# Patient Record
Sex: Male | Born: 1960 | Race: White | Hispanic: No | Marital: Single | Smoking: Current every day smoker
Health system: Southern US, Community
[De-identification: ages and names within clinical notes are randomized; demographics above are authoritative.]

## PROBLEM LIST (undated history)

## (undated) DIAGNOSIS — E119 Type 2 diabetes mellitus without complications: Secondary | ICD-10-CM

## (undated) DIAGNOSIS — F250 Schizoaffective disorder, bipolar type: Secondary | ICD-10-CM

## (undated) DIAGNOSIS — F259 Schizoaffective disorder, unspecified: Secondary | ICD-10-CM

## (undated) DIAGNOSIS — F1020 Alcohol dependence, uncomplicated: Secondary | ICD-10-CM

## (undated) DIAGNOSIS — Z5900 Homelessness unspecified: Secondary | ICD-10-CM

## (undated) DIAGNOSIS — F329 Major depressive disorder, single episode, unspecified: Secondary | ICD-10-CM

## (undated) DIAGNOSIS — I714 Abdominal aortic aneurysm, without rupture, unspecified: Secondary | ICD-10-CM

## (undated) DIAGNOSIS — F319 Bipolar disorder, unspecified: Secondary | ICD-10-CM

## (undated) DIAGNOSIS — F32A Depression, unspecified: Secondary | ICD-10-CM

## (undated) HISTORY — PX: DENTAL SURGERY: SHX609

## (undated) HISTORY — DX: Abdominal aortic aneurysm, without rupture, unspecified: I71.40

---

## 2003-11-18 ENCOUNTER — Emergency Department (HOSPITAL_COMMUNITY): Admission: EM | Admit: 2003-11-18 | Discharge: 2003-11-18 | Payer: Self-pay | Admitting: Emergency Medicine

## 2003-12-30 ENCOUNTER — Emergency Department (HOSPITAL_COMMUNITY): Admission: EM | Admit: 2003-12-30 | Discharge: 2003-12-31 | Payer: Self-pay | Admitting: Emergency Medicine

## 2005-12-19 ENCOUNTER — Emergency Department (HOSPITAL_COMMUNITY): Admission: EM | Admit: 2005-12-19 | Discharge: 2005-12-19 | Payer: Self-pay | Admitting: Emergency Medicine

## 2006-05-26 ENCOUNTER — Emergency Department (HOSPITAL_COMMUNITY): Admission: EM | Admit: 2006-05-26 | Discharge: 2006-05-26 | Payer: Self-pay | Admitting: Emergency Medicine

## 2008-03-23 ENCOUNTER — Ambulatory Visit: Payer: Self-pay | Admitting: Internal Medicine

## 2008-04-20 ENCOUNTER — Ambulatory Visit: Payer: Self-pay | Admitting: Internal Medicine

## 2008-04-23 ENCOUNTER — Emergency Department (HOSPITAL_COMMUNITY): Admission: EM | Admit: 2008-04-23 | Discharge: 2008-04-23 | Payer: Self-pay | Admitting: Emergency Medicine

## 2008-05-08 ENCOUNTER — Ambulatory Visit (HOSPITAL_COMMUNITY): Admission: RE | Admit: 2008-05-08 | Discharge: 2008-05-09 | Payer: Self-pay | Admitting: General Surgery

## 2008-07-14 ENCOUNTER — Inpatient Hospital Stay (HOSPITAL_COMMUNITY): Admission: RE | Admit: 2008-07-14 | Discharge: 2008-07-15 | Payer: Self-pay | Admitting: General Surgery

## 2010-05-14 ENCOUNTER — Emergency Department (HOSPITAL_COMMUNITY): Admission: EM | Admit: 2010-05-14 | Discharge: 2010-05-14 | Payer: Self-pay | Admitting: Emergency Medicine

## 2010-11-27 ENCOUNTER — Emergency Department (HOSPITAL_COMMUNITY)
Admission: EM | Admit: 2010-11-27 | Discharge: 2010-11-27 | Disposition: A | Discharge status: Home / Self Care | Payer: Self-pay | Attending: Emergency Medicine | Admitting: Emergency Medicine

## 2010-11-27 DIAGNOSIS — M7989 Other specified soft tissue disorders: Secondary | ICD-10-CM | POA: Insufficient documentation

## 2010-11-27 DIAGNOSIS — M109 Gout, unspecified: Secondary | ICD-10-CM | POA: Insufficient documentation

## 2010-11-27 DIAGNOSIS — M79609 Pain in unspecified limb: Secondary | ICD-10-CM | POA: Insufficient documentation

## 2010-12-05 LAB — DIFFERENTIAL
Eosinophils Relative: 0 % (ref 0–5)
Lymphocytes Relative: 17 % (ref 12–46)
Lymphs Abs: 1.9 10*3/uL (ref 0.7–4.0)
Monocytes Absolute: 0.9 10*3/uL (ref 0.1–1.0)
Neutro Abs: 8.4 10*3/uL — ABNORMAL HIGH (ref 1.7–7.7)

## 2010-12-05 LAB — CBC
HCT: 40.8 % (ref 39.0–52.0)
MCV: 96.9 fL (ref 78.0–100.0)
Platelets: 214 10*3/uL (ref 150–400)
RBC: 4.21 MIL/uL — ABNORMAL LOW (ref 4.22–5.81)
WBC: 11.2 10*3/uL — ABNORMAL HIGH (ref 4.0–10.5)

## 2010-12-05 LAB — BASIC METABOLIC PANEL
Chloride: 104 mEq/L (ref 96–112)
GFR calc Af Amer: >60 mL/min (ref >60)
GFR calc non Af Amer: >60 mL/min (ref >60)
Potassium: 3.6 mEq/L (ref 3.5–5.1)

## 2010-12-05 LAB — GLUCOSE, CAPILLARY: Glucose-Capillary: 84 mg/dL (ref 70–99)

## 2011-02-04 NOTE — Op Note (Signed)
[name], [name]          ACCOUNT NO.:  [id number]   MEDICAL RECORD NO.:  [id number]          PATIENT TYPE:  AMB   LOCATION:  DAY                          FACILITY:  [facility]   PHYSICIAN:  Juanetta Gosling, MDDATE OF BIRTH:  04-15-1961   DATE OF PROCEDURE:  05/08/2008  DATE OF DISCHARGE:                               OPERATIVE REPORT   PREOPERATIVE DIAGNOSIS:  Right inguinal hernia.   POSTOPERATIVE DIAGNOSIS:  Direct right inguinal hernia.   OPERATION PERFORMED:  Right inguinal hernia repair with large Prolene  hernia system mesh.   SURGEON:  Juanetta Gosling, MD   ASSISTANT:  Leonie Man, M.D.   ANESTHESIA:  General.   FINDINGS:  Direct hernia.   SPECIMENS:  None.   BLOOD LOSS:  Minimal.   COMPLICATIONS:  None.   DRAINS:  None.   DISPOSITION:  To PACU in stable condition.   INDICATIONS FOR PROCEDURE:  This is a 50 year old male with a history of  gout.  He also has a 2-year history of a right groin bulge __________  last year has been present, only reducible when he is lying down.  This  is described as painful at times, describes no other symptoms with  urination or bowel movement.  On exam he had no left inguinal hernia.  On the right, inguinal hernia present.  I counseled him for repair with  mesh.   DESCRIPTION OF PROCEDURE:  After informed consent was obtained, the  patient was taken to the operating room.  He was administered 1 g of  intravenous Ancef and placed under general endotracheal anesthesia  without complication.  His right groin was then prepped and draped in  standard sterile surgical fashion.  A 4 cm incision was then made  overlying the right groin crease and dissection carried out down to the  level of external oblique which was then entered sharply through the  external ring.  The cord was then encircled along the ilioinguinal nerve  which was preserved and placed in a Penrose drain. He had no evidence of  an indirect hernia or  cord lipoma.  Upon retraction of the cord, he was  noted to have a large direct hernia.  The space was freed laterally  under the external abdominal oblique.  Electrocautery was used to enter  the preperitoneal space and a sponge was then used to develop this  preperitoneal space over the pubic tubercle and over Cooper's ligament.  The direct hernia was reduced from the sac and a sponge was then  inserted.  The Prolene hernia system was then brought up and oriented  correctly.  The sponge was removed.  The Prolene hernia system was then  inserted.  The bottom bilayer was then spread over Cooper's ligament and  over the pubic tubercle.  I then used a 2-0 Vicryl to close the floor in  between these two layers of mesh. The connector was then brought up into  position and the top layer was then laid flat.  This was sutured in  position at but not into the pubic tubercle with a 2-0 Vicryl.  A T cut  was then made around the cord.  This was then wrapped around the cord  and then sutured together as well as well as tacking this to the  inguinal ligament inferiorly.  Superiorly this was tacked to the  internal oblique and the lateral limb was then laid flat underneath the  external abdominal oblique.  Hemostasis was observed, the Penrose was  removed.  The vas deferens and testicular arteries and vessels had been  identified and preserved throughout this portion of the operation as  well.  The external abdominal oblique was closed with a 2-0 Vicryl.  Scarpa's was approximated with a 3-0 Vicryl.  The skin was closed with 4-  0 Monocryl in subcuticular fashion.  10 mL of 1% lidocaine mixed with  0.25% Marcaine without epinephrine was then infiltrated into the wound  as well as performing an ilioinguinal block.  Dermabond was then placed  over the wound.  He tolerated this well, was extubated in the operating  room and transferred back in stable condition.      Juanetta Gosling, MD   Electronically Signed     MCW/MEDQ  D:  05/08/2008  T:  05/08/2008  Job:  [id number]

## 2011-02-04 NOTE — Op Note (Signed)
NAMELEELYND, MALDONADO          ACCOUNT NO.:  [id number]   MEDICAL RECORD NO.:  [id number]          PATIENT TYPE:  INP   LOCATION:  0009                         FACILITY:  [facility]   PHYSICIAN:  Juanetta Gosling, MDDATE OF BIRTH:  [birth year]   DATE OF PROCEDURE:  07/14/2008  DATE OF DISCHARGE:                               OPERATIVE REPORT   PREOPERATIVE DIAGNOSES:  Right groin hernia.   POSTOPERATIVE DIAGNOSES:  Right femoral hernia.   PROCEDURE:  Right femoral hernia repair with mesh, laparoscopic.   SURGEON:  Juanetta Gosling, M.D.   ASSISTANT:  Ardeth Sportsman, M.D.   ANESTHESIA:  General.   FINDINGS:  Femoral hernia.   SPECIMENS:  None.   DRAINS:  None.   COMPLICATIONS:  None.   ESTIMATED BLOOD LOSS:  Minimal.   DISPOSITION:  To PACU in stable condition.   INDICATIONS:  Jorja Loa is a 50 year old male, who presented to me on August 3  with a two-year history of right-groin pain.  I took him to the  operating room on August 17 for right inguinal hernia repair.  He had a  large direct right inguinal hernia that I repaired with a large Prolene  hernia system.  He did well until two weeks postoperatively and  developed an acute bulge in his right groin, associated with some pain.  On exam, he appeared to have a recurrent hernia.  I took him to the  operating room today for laparoscopic repair.   DESCRIPTION OF PROCEDURE:  After informed consent was obtained, patient  was taken to the operating room, he was administered one gram of  intravenous Ancef.  Sequential compression devices were placed on the  lower extremities throughout the procedure.  He underwent general  endotracheal anesthesia without complication.  Foley catheter was  placed.  His arms were tucked and appropriately padded.  His abdomen and  groin were then prepped and draped in standard sterile surgical fashion.  Surgical time-out was then performed.   A 10 mm curvilinear infraumbilical incision was  then made and dissection  carried out down to the level of the external abdominal oblique.  This  was incised sharply.  The rectus muscle was pulled laterally and a  Hasson trocar was then inserted into the preperitoneal space.  The  preperitoneal space was then insufflated without complication.  The  camera was used to dissect this space easily.  Two further 5 mm trocars  were placed at the level of the umbilicus, just lateral to the rectus on  either side, under direct vision, after infiltration with 0.25% Marcaine  without epinephrine.  Following this, the right side was then  approached.  The right groin was then dissected.  The iliac vessels,  epigastric vessels, bladder, pubic symphysis and Cooper's ligament were  easily identified.   It appeared that he had a femoral hernia that had gone below the mesh  that I had placed previously.  He did not have any evidence of indirect  or direct inguinal hernia.  These were covered by the mesh.  The femoral  hernia was then reduced in total.  The peritoneal sac  was then brought  back cephalad to the level of the ASIS.  There was a rent made in the  peritoneal sac during this portion that was fixed with clips.  Following  reduction of his femoral hernia, a 12 by 15 cm piece of Ultrapro mesh  was then inserted and placed into position.  This was tacked twice to  Cooper's ligament and three times to the abdominal wall, laterally.   Following this, the preperitoneal space was desufflated with a grasper  on the mesh to let it lie in good position, which it did.  He tolerated  this well.  All trocars were removed.  The incision in the external  abdominal oblique was closed with two simple Vicryl sutures.  Skin was  closed with a 4-0 Monocryl in subcuticular fashion.  Dermabond was  placed over all of the wounds.  Foley catheter was removed.  He was  extubated in the operating room and he was transferred to the PACU in  stable condition.       Juanetta Gosling, MD  Electronically Signed     MCW/MEDQ  D:  07/14/2008  T:  07/14/2008  Job:  [id number]

## 2011-06-20 LAB — DIFFERENTIAL
Basophils Absolute: 0
Basophils Relative: 1
Eosinophils Absolute: 0.2
Neutro Abs: 2.9
Neutrophils Relative %: 50

## 2011-06-20 LAB — COMPREHENSIVE METABOLIC PANEL
ALT: 9
Alkaline Phosphatase: 55
BUN: 9
CO2: 29
GFR calc non Af Amer: >60
Glucose, Bld: 90
Potassium: 4.1
Sodium: 143
Total Bilirubin: 0.7
Total Protein: 6.3

## 2011-06-20 LAB — CBC
HCT: 38.5 — ABNORMAL LOW
Hemoglobin: 12.9 — ABNORMAL LOW
RBC: 3.96 — ABNORMAL LOW
RDW: 13.9

## 2016-03-17 ENCOUNTER — Emergency Department (HOSPITAL_COMMUNITY): Payer: Medicaid Other

## 2016-03-17 ENCOUNTER — Encounter (HOSPITAL_COMMUNITY): Payer: Self-pay | Admitting: Emergency Medicine

## 2016-03-17 ENCOUNTER — Emergency Department (HOSPITAL_COMMUNITY)
Admission: EM | Admit: 2016-03-17 | Discharge: 2016-03-17 | Disposition: A | Discharge status: Home / Self Care | Payer: Medicaid Other | Attending: Emergency Medicine | Admitting: Emergency Medicine

## 2016-03-17 DIAGNOSIS — R51 Headache: Secondary | ICD-10-CM | POA: Insufficient documentation

## 2016-03-17 DIAGNOSIS — Z9889 Other specified postprocedural states: Secondary | ICD-10-CM

## 2016-03-17 DIAGNOSIS — F17200 Nicotine dependence, unspecified, uncomplicated: Secondary | ICD-10-CM | POA: Diagnosis not present

## 2016-03-17 DIAGNOSIS — R519 Headache, unspecified: Secondary | ICD-10-CM

## 2016-03-17 HISTORY — DX: Major depressive disorder, single episode, unspecified: F32.9

## 2016-03-17 HISTORY — DX: Depression, unspecified: F32.A

## 2016-03-17 LAB — COMPREHENSIVE METABOLIC PANEL
ALBUMIN: 4.4 g/dL (ref 3.5–5.0)
ALT: 9 U/L — ABNORMAL LOW (ref 17–63)
ANION GAP: 14 (ref 5–15)
AST: 27 U/L (ref 15–41)
Alkaline Phosphatase: 105 U/L (ref 38–126)
BILIRUBIN TOTAL: 1.2 mg/dL (ref 0.3–1.2)
BUN: 10 mg/dL (ref 6–20)
CHLORIDE: 99 mmol/L — AB (ref 101–111)
CO2: 24 mmol/L (ref 22–32)
Calcium: 9.5 mg/dL (ref 8.9–10.3)
Creatinine, Ser: 1.2 mg/dL (ref 0.61–1.24)
GFR calc Af Amer: >60 mL/min (ref >60)
GLUCOSE: 115 mg/dL — AB (ref 65–99)
POTASSIUM: 4.1 mmol/L (ref 3.5–5.1)
Sodium: 137 mmol/L (ref 135–145)
TOTAL PROTEIN: 6.7 g/dL (ref 6.5–8.1)

## 2016-03-17 LAB — CBC WITH DIFFERENTIAL/PLATELET
BASOS ABS: 0.1 10*3/uL (ref 0.0–0.1)
Basophils Relative: 0 %
Eosinophils Absolute: 0 10*3/uL (ref 0.0–0.7)
Eosinophils Relative: 0 %
HEMATOCRIT: 52.4 % — AB (ref 39.0–52.0)
Hemoglobin: 17.6 g/dL — ABNORMAL HIGH (ref 13.0–17.0)
LYMPHS ABS: 1.4 10*3/uL (ref 0.7–4.0)
LYMPHS PCT: 10 %
MCH: 33.4 pg (ref 26.0–34.0)
MCHC: 33.6 g/dL (ref 30.0–36.0)
MCV: 99.4 fL (ref 78.0–100.0)
Monocytes Absolute: 1.1 10*3/uL — ABNORMAL HIGH (ref 0.1–1.0)
Monocytes Relative: 7 %
NEUTROS ABS: 12.2 10*3/uL — AB (ref 1.7–7.7)
Neutrophils Relative %: 83 %
Platelets: 244 10*3/uL (ref 150–400)
RBC: 5.27 MIL/uL (ref 4.22–5.81)
RDW: 15 % (ref 11.5–15.5)
WBC: 14.8 10*3/uL — AB (ref 4.0–10.5)

## 2016-03-17 MED ORDER — KETOROLAC TROMETHAMINE 30 MG/ML IJ SOLN
30.0000 mg | Freq: Once | INTRAMUSCULAR | Status: AC
  Administered 2016-03-17: 30 mg via INTRAVENOUS
  Filled 2016-03-17: qty 1

## 2016-03-17 MED ORDER — SODIUM CHLORIDE 0.9 % IV BOLUS (SEPSIS)
1000.0000 mL | Freq: Once | INTRAVENOUS | Status: AC
  Administered 2016-03-17: 1000 mL via INTRAVENOUS

## 2016-03-17 MED ORDER — MORPHINE SULFATE (PF) 4 MG/ML IV SOLN
4.0000 mg | Freq: Once | INTRAVENOUS | Status: AC
  Administered 2016-03-17: 4 mg via INTRAVENOUS
  Filled 2016-03-17: qty 1

## 2016-03-17 MED ORDER — METOCLOPRAMIDE HCL 5 MG/ML IJ SOLN
10.0000 mg | Freq: Once | INTRAMUSCULAR | Status: AC
  Administered 2016-03-17: 10 mg via INTRAVENOUS
  Filled 2016-03-17: qty 2

## 2016-03-17 NOTE — ED Notes (Signed)
H/a started last night  Feels weak  has had diarrhea, states lives in tent

## 2016-03-17 NOTE — Discharge Instructions (Signed)
There does not appear to be an emergent cause for your symptoms at this time. Your exam, labs, CT scans and MRI of your brain are all reassuring. Please follow-up with your doctor or the [facility] to establish primary care. Return to ED for any new or worsening symptoms.  General Headache Without Cause A headache is pain or discomfort felt around the head or neck area. The specific cause of a headache may not be found. There are many causes and types of headaches. A few common ones are:  Tension headaches.  Migraine headaches.  Cluster headaches.  Chronic daily headaches. HOME CARE INSTRUCTIONS  Watch your condition for any changes. Take these steps to help with your condition: Managing Pain  Take over-the-counter and prescription medicines only as told by your health care provider.  Lie down in a dark, quiet room when you have a headache.  If directed, apply ice to the head and neck area:  Put ice in a plastic bag.  Place a towel between your skin and the bag.  Leave the ice on for 20 minutes, 2-3 times per day.  Use a heating pad or hot shower to apply heat to the head and neck area as told by your health care provider.  Keep lights dim if bright lights bother you or make your headaches worse. Eating and Drinking  Eat meals on a regular schedule.  Limit alcohol use.  Decrease the amount of caffeine you drink, or stop drinking caffeine. General Instructions  Keep all follow-up visits as told by your health care provider. This is important.  Keep a headache journal to help find out what may trigger your headaches. For example, write down:  What you eat and drink.  How much sleep you get.  Any change to your diet or medicines.  Try massage or other relaxation techniques.  Limit stress.  Sit up straight, and do not tense your muscles.  Do not use tobacco products, including cigarettes, chewing tobacco, or e-cigarettes. If you need help  quitting, ask your health care provider.  Exercise regularly as told by your health care provider.  Sleep on a regular schedule. Get 7-9 hours of sleep, or the amount recommended by your health care provider. SEEK MEDICAL CARE IF:   Your symptoms are not helped by medicine.  You have a headache that is different from the usual headache.  You have nausea or you vomit.  You have a fever. SEEK IMMEDIATE MEDICAL CARE IF:   Your headache becomes severe.  You have repeated vomiting.  You have a stiff neck.  You have a loss of vision.  You have problems with speech.  You have pain in the eye or ear.  You have muscular weakness or loss of muscle control.  You lose your balance or have trouble walking.  You feel faint or pass out.  You have confusion.   This information is not intended to replace advice given to you by your health care provider. Make sure you discuss any questions you have with your health care provider.   Document Released: 09/08/2005 Document Revised: 05/30/2015 Document Reviewed: 01/01/2015 Elsevier Interactive Patient Education [employer]2016 [employer] Inc.

## 2016-03-17 NOTE — ED Notes (Signed)
Dr. Shela CommonsJ made aware of patient's symptoms and symptom onset. No orders to call code stroke placed.

## 2016-03-17 NOTE — ED Provider Notes (Signed)
Complained of frontal headache onset upon awakening 4 AM today accompanied by blurred vision both eyes and numbness in the palm of his right hand. Numbness has since resolved. No fever. No nausea or vomiting. No treatment prior to coming here. On exam no distress Glasgow Coma Score 15 HEENT exam no facial asymmetry eyes pupils equal round react to light extraocular muscles intact neurologic Glasgow Coma Score 15 cranial nerves II through XII grossly intact motor strength 5 over 5 overall DTRs symmetric bilaterally at knee jerk ankle jerk and biceps was ordered bilaterally  Doug SouSam Mysti Haley, MD 03/17/16 1312

## 2016-03-17 NOTE — ED Provider Notes (Signed)
CSN: [id number]     Arrival date & time 03/17/16  1026 History   First MD Initiated Contact with Patient 03/17/16 1057     Chief Complaint  Patient presents with  . Headache     (Consider location/radiation/quality/duration/timing/severity/associated sxs/prior Treatment) HPI [name] is a 55 y.o. male who comes in for evaluation for headache. Patient reports at this morning at 4 AM he began having gradual onset, gradually worsening frontal headache. Not relieved with ibuprofen and Tylenol. Patient states he does not typically get headaches. Reports the palm of his hand and his tongue were intermittently numb this morning while waiting on the bus at approximately 9:00 AM. He reports associated diarrhea over the past one week that is gradually improving. Denies any fevers, chills, neck pain or stiffness, vision changes, rash, chest pain or shortness of breath. Nothing makes the problem better or worse. No other modifying factors.  40-pack-year smoking history. Past Medical History  Diagnosis Date  . Depression    History reviewed. No pertinent past surgical history. No family history on file. Social History  Substance Use Topics  . Smoking status: Current Every Day Smoker  . Smokeless tobacco: None  . Alcohol Use: None    Review of Systems A 10 point review of systems was completed and was negative except for pertinent positives and negatives as mentioned in the history of present illness     Allergies  Review of patient's allergies indicates no known allergies.  Home Medications   Prior to Admission medications   Not on File   BP 145/86 mmHg  Pulse 65  Temp(Src) 98.2 F (36.8 C) (Oral)  Resp 15  SpO2 98% Physical Exam  Constitutional: He is oriented to person, place, and time. He appears well-developed and well-nourished. No distress.  HENT:  Head: Normocephalic and atraumatic.  Mouth/Throat: Oropharynx is clear and moist.  Eyes: Conjunctivae and EOM are  normal. Pupils are equal, round, and reactive to light. Right eye exhibits no discharge. Left eye exhibits no discharge. No scleral icterus.  Neck: Normal range of motion. Neck supple.  No meningismus or nuchal rigidity  Cardiovascular: Normal rate, regular rhythm and normal heart sounds.   Pulmonary/Chest: Effort normal and breath sounds normal. No respiratory distress. He has no wheezes. He has no rales.  Abdominal: Soft. There is no tenderness.  Musculoskeletal: Normal range of motion. He exhibits no edema or tenderness.  Neurological: He is alert and oriented to person, place, and time.  Cranial Nerves II-XII grossly intact. Motor strength 5/5 in all 4 extremities. Sensation intact to light touch. Grip strength equal bilaterally. Finger to nose normal. Moves all extremities without ataxia. Gait baseline.  Skin: Skin is warm and dry. No rash noted. He is not diaphoretic.  Psychiatric: He has a normal mood and affect.  Nursing note and vitals reviewed.   ED Course  Procedures (including critical care time) Labs Review Labs Reviewed  COMPREHENSIVE METABOLIC PANEL - Abnormal; Notable for the following:    Chloride 99 (*)    Glucose, Bld 115 (*)    ALT 9 (*)    All other components within normal limits  CBC WITH DIFFERENTIAL/PLATELET - Abnormal; Notable for the following:    WBC 14.8 (*)    Hemoglobin 17.6 (*)    HCT 52.4 (*)    Neutro Abs 12.2 (*)    Monocytes Absolute 1.1 (*)    All other components within normal limits    Imaging Review Dg Eye Foreign Body  03/17/2016  CLINICAL DATA:  Metal working/exposure; clearance prior to MRI EXAM: ORBITS FOR FOREIGN BODY - 2 VIEW COMPARISON:  None. FINDINGS: No evidence of radiopaque structure in the region of the orbits. Rounded metallic structure left aspect of the upper neck. Visualized paranasal sinuses are clear. IMPRESSION: No evidence of radiopaque structure in the region of the orbits. Rounded metallic structure left aspect of the  upper neck. Electronically Signed   By: Lacy Duverney M.D.   On: 03/17/2016 15:21   Dg Chest 2 View  03/17/2016  CLINICAL DATA:  Wheezing, severe headache since 4 a.m.  Dizziness. EXAM: CHEST  2 VIEW COMPARISON:  05/14/2010 FINDINGS: Heart and mediastinal contours are within normal limits. No focal opacities or effusions. No acute bony abnormality. IMPRESSION: No active cardiopulmonary disease. Electronically Signed   By: Charlett Nose M.D.   On: 03/17/2016 11:57   Ct Head Wo Contrast  03/17/2016  CLINICAL DATA:  Headaches EXAM: CT HEAD WITHOUT CONTRAST TECHNIQUE: Contiguous axial images were obtained from the base of the skull through the vertex without intravenous contrast. COMPARISON:  None. FINDINGS: The bony calvarium is intact. The ventricles are of normal size and configuration. No findings to suggest acute hemorrhage, acute infarction or space-occupying mass lesion are noted. IMPRESSION: No acute intracranial abnormality noted. Electronically Signed   By: Alcide Clever M.D.   On: 03/17/2016 12:10   Mr Brain Wo Contrast (neuro Protocol)  03/17/2016  CLINICAL DATA:  New onset of frontal headache which awoke him from sleep at 4 a.m. today. Associated blurred vision in both eyes. Numbness in the palm all those right hand. Numbness has since resolved. EXAM: MRI HEAD WITHOUT CONTRAST TECHNIQUE: Multiplanar, multiecho pulse sequences of the brain and surrounding structures were obtained without intravenous contrast. COMPARISON:  CT head from the same day. FINDINGS: There is significant artifact in susceptibility centered at the level of the left temporal bone. This obscures significant portions of the exam. Moderate generalized atrophy is advanced for age. Focal encephalomalacia is noted in the right occipital pole suggesting a remote ischemic event. No acute infarct, hemorrhage, or mass lesion is present. The ventricles are proportionate to the degree of atrophy. No significant extra-axial fluid collection  is present. The right internal auditory canal is visualized and normal. Flow is present in the major intracranial arteries. The globes and orbits are intact. The visualized paranasal sinuses and right mastoid air cells are clear. Skullbase is within normal limits. Midline sagittal images are unremarkable. IMPRESSION: 1. No acute intracranial abnormality. 2. 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 Electronically Signed   By: Marin Roberts M.D.   On: 03/17/2016 16:42   I have personally reviewed and evaluated these images and lab results as part of my medical decision-making.   EKG Interpretation None      MDM  Pt With frontal HA treated and improved while in ED.  Presentation not concerning for [facility], ICH, Meningitis, or temporal arteritis. Pt is afebrile with no focal neuro deficits, nuchal rigidity. He does complain of fleeting and bilateral vision blurriness, paresthesias in right palm and tongue. CT head and MRI are both negative for any acute intracranial abnormalities. Heart rate improved dramatically with IV fluids, likely secondary to patient's dehydration. Nonspecific leukocytosis, no evidence of infection. Headache possibly secondary to dehydration. Pt is to follow up with PCP to discuss prophylactic medication. Pt verbalizes understanding and is agreeable with plan to dc.  Prior to patient discharge, I discussed and reviewed this case with  Dr. Ethelda ChickJacubowitz  Final diagnoses:  Nonintractable headache, unspecified chronicity pattern, unspecified headache type        Joycie PeekBenjamin Kiran Lapine, PA-C 03/17/16 2002  Doug SouSam Jacubowitz, MD 03/18/16 1623

## 2016-04-01 ENCOUNTER — Ambulatory Visit: Payer: Medicaid Other | Attending: Internal Medicine | Admitting: Internal Medicine

## 2016-04-01 ENCOUNTER — Encounter: Payer: Self-pay | Admitting: Internal Medicine

## 2016-04-01 VITALS — BP 163/98 | HR 108 | Temp 98.3°F | Resp 16 | Wt 147.4 lb

## 2016-04-01 DIAGNOSIS — Z5900 Homelessness unspecified: Secondary | ICD-10-CM | POA: Insufficient documentation

## 2016-04-01 DIAGNOSIS — Z720 Tobacco use: Secondary | ICD-10-CM | POA: Diagnosis not present

## 2016-04-01 DIAGNOSIS — Z125 Encounter for screening for malignant neoplasm of prostate: Secondary | ICD-10-CM

## 2016-04-01 DIAGNOSIS — Z131 Encounter for screening for diabetes mellitus: Secondary | ICD-10-CM

## 2016-04-01 DIAGNOSIS — I10 Essential (primary) hypertension: Secondary | ICD-10-CM | POA: Diagnosis not present

## 2016-04-01 DIAGNOSIS — Z23 Encounter for immunization: Secondary | ICD-10-CM

## 2016-04-01 DIAGNOSIS — J302 Other seasonal allergic rhinitis: Secondary | ICD-10-CM

## 2016-04-01 MED ORDER — LORATADINE 10 MG PO TABS: 10.0000 mg | ORAL_TABLET | Freq: Every day | ORAL | Status: DC

## 2016-04-01 MED ORDER — METOPROLOL TARTRATE 25 MG PO TABS: 25.0000 mg | ORAL_TABLET | Freq: Two times a day (BID) | ORAL | Status: DC

## 2016-04-01 MED ORDER — PNEUMOCOCCAL VAC POLYVALENT 25 MCG/0.5ML IJ INJ: 0.5000 mL | INJECTION | Freq: Once | INTRAMUSCULAR | Status: DC

## 2016-04-01 NOTE — Patient Instructions (Signed)
Smoking Cessation, Tips for Success If you are ready to quit smoking, congratulations! You have chosen to help yourself be healthier. Cigarettes bring nicotine, tar, carbon monoxide, and other irritants into your body. Your lungs, heart, and blood vessels will be able to work better without these poisons. There are many different ways to quit smoking. Nicotine gum, nicotine patches, a nicotine inhaler, or nicotine nasal spray can help with physical craving. Hypnosis, support groups, and medicines help break the habit of smoking. WHAT THINGS CAN I DO TO MAKE QUITTING EASIER?  Here are some tips to help you quit for good:  Pick a date when you will quit smoking completely. Tell all of your friends and family about your plan to quit on that date.  Do not try to slowly cut down on the number of cigarettes you are smoking. Pick a quit date and quit smoking completely starting on that day.  Throw away all cigarettes.   Clean and remove all ashtrays from your home, work, and car.  On a card, write down your reasons for quitting. Carry the card with you and read it when you get the urge to smoke.  Cleanse your body of nicotine. Drink enough water and fluids to keep your urine clear or pale yellow. Do this after quitting to flush the nicotine from your body.  Learn to predict your moods. Do not let a bad situation be your excuse to have a cigarette. Some situations in your life might tempt you into wanting a cigarette.  Never have "just one" cigarette. It leads to wanting another and another. Remind yourself of your decision to quit.  Change habits associated with smoking. If you smoked while driving or when feeling stressed, try other activities to replace smoking. Stand up when drinking your coffee. Brush your teeth after eating. Sit in a different chair when you read the paper. Avoid alcohol while trying to quit, and try to drink fewer caffeinated beverages. Alcohol and caffeine may urge you to  smoke.  Avoid foods and drinks that can trigger a desire to smoke, such as sugary or spicy foods and alcohol.  Ask people who smoke not to smoke around you.  Have something planned to do right after eating or having a cup of coffee. For example, plan to take a walk or exercise.  Try a relaxation exercise to calm you down and decrease your stress. Remember, you may be tense and nervous for the first 2 weeks after you quit, but this will pass.  Find new activities to keep your hands busy. Play with a pen, coin, or rubber band. Doodle or draw things on paper.  Brush your teeth right after eating. This will help cut down on the craving for the taste of tobacco after meals. You can also try mouthwash.   Use oral substitutes in place of cigarettes. Try using lemon drops, carrots, cinnamon sticks, or chewing gum. Keep them handy so they are available when you have the urge to smoke.  When you have the urge to smoke, try deep breathing.  Designate your home as a nonsmoking area.  If you are a heavy smoker, ask your health care provider about a prescription for nicotine chewing gum. It can ease your withdrawal from nicotine.  Reward yourself. Set aside the cigarette money you save and buy yourself something nice.  Look for support from others. Join a support group or smoking cessation program. Ask someone at home or at work to help you with your plan   to quit smoking.  Always ask yourself, "Do I need this cigarette or is this just a reflex?" Tell yourself, "Today, I choose not to smoke," or "I do not want to smoke." You are reminding yourself of your decision to quit.  Do not replace cigarette smoking with electronic cigarettes (commonly called e-cigarettes). The safety of e-cigarettes is unknown, and some may contain harmful chemicals.  If you relapse, do not give up! Plan ahead and think about what you will do the next time you get the urge to smoke. HOW WILL I FEEL WHEN I QUIT SMOKING? You  may have symptoms of withdrawal because your body is used to nicotine (the addictive substance in cigarettes). You may crave cigarettes, be irritable, feel very hungry, cough often, get headaches, or have difficulty concentrating. The withdrawal symptoms are only temporary. They are strongest when you first quit but will go away within 10-14 days. When withdrawal symptoms occur, stay in control. Think about your reasons for quitting. Remind yourself that these are signs that your body is healing and getting used to being without cigarettes. Remember that withdrawal symptoms are easier to treat than the major diseases that smoking can cause.  Even after the withdrawal is over, expect periodic urges to smoke. However, these cravings are generally short lived and will go away whether you smoke or not. Do not smoke! WHAT RESOURCES ARE AVAILABLE TO HELP ME QUIT SMOKING? Your health care provider can direct you to community resources or hospitals for support, which may include:  Group support.  Education.  Hypnosis.  Therapy.   This information is not intended to replace advice given to you by your health care provider. Make sure you discuss any questions you have with your health care provider.   Document Released: 06/06/2004 Document Revised: 09/29/2014 Document Reviewed: 02/24/2013 Elsevier Interactive Patient Education 2016 Elsevier Inc.  - Low-Sodium Eating Plan Sodium raises blood pressure and causes water to be held in the body. Getting less sodium from food will help lower your blood pressure, reduce any swelling, and protect your heart, liver, and kidneys. We get sodium by adding salt (sodium chloride) to food. Most of our sodium comes from canned, boxed, and frozen foods. Restaurant foods, fast foods, and pizza are also very high in sodium. Even if you take medicine to lower your blood pressure or to reduce fluid in your body, getting less sodium from your food is important. WHAT IS MY  PLAN? Most people should limit their sodium intake to 2,300 mg a day. Your health care provider recommends that you limit your sodium intake to __________ a day.  WHAT DO I NEED TO KNOW ABOUT THIS EATING PLAN? For the low-sodium eating plan, you will follow these general guidelines:  Choose foods with a % Daily Value for sodium of less than 5% (as listed on the food label).   Use salt-free seasonings or herbs instead of table salt or sea salt.   Check with your health care provider or pharmacist before using salt substitutes.   Eat fresh foods.  Eat more vegetables and fruits.  Limit canned vegetables. If you do use them, rinse them well to decrease the sodium.   Limit cheese to 1 oz (28 g) per day.   Eat lower-sodium products, often labeled as "lower sodium" or "no salt added."  Avoid foods that contain monosodium glutamate (MSG). MSG is sometimes added to [place] food and some canned foods.  Check food labels (Nutrition Facts labels) on foods to learn how  much sodium is in one serving.  Eat more home-cooked food and less restaurant, buffet, and fast food.  When eating at a restaurant, ask that your food be prepared with less salt, or no salt if possible.  HOW DO I READ FOOD LABELS FOR SODIUM INFORMATION? The Nutrition Facts label lists the amount of sodium in one serving of the food. If you eat more than one serving, you must multiply the listed amount of sodium by the number of servings. Food labels may also identify foods as:  Sodium free--Less than 5 mg in a serving.  Very low sodium--35 mg or less in a serving.  Low sodium--140 mg or less in a serving.  Light in sodium--50% less sodium in a serving. For example, if a food that usually has 300 mg of sodium is changed to become light in sodium, it will have 150 mg of sodium.  Reduced sodium--25% less sodium in a serving. For example, if a food that usually has 400 mg of sodium is changed to reduced sodium, it  will have 300 mg of sodium. WHAT FOODS CAN I EAT? Grains Low-sodium cereals, including oats, puffed wheat and rice, and shredded wheat cereals. Low-sodium crackers. Unsalted rice and pasta. Lower-sodium bread.  Vegetables Frozen or fresh vegetables. Low-sodium or reduced-sodium canned vegetables. Low-sodium or reduced-sodium tomato sauce and paste. Low-sodium or reduced-sodium tomato and vegetable juices.  Fruits Fresh, frozen, and canned fruit. Fruit juice.  Meat and Other Protein Products Low-sodium canned tuna and salmon. Fresh or frozen meat, poultry, seafood, and fish. Lamb. Unsalted nuts. Dried beans, peas, and lentils without added salt. Unsalted canned beans. Homemade soups without salt. Eggs.  Dairy Milk. Soy milk. Ricotta cheese. Low-sodium or reduced-sodium cheeses. Yogurt.  Condiments Fresh and dried herbs and spices. Salt-free seasonings. Onion and garlic powders. Low-sodium varieties of mustard and ketchup. Fresh or refrigerated horseradish. Lemon juice.  Fats and Oils Reduced-sodium salad dressings. Unsalted butter.  Other Unsalted popcorn and pretzels.  The items listed above may not be a complete list of recommended foods or beverages. Contact your dietitian for more options. WHAT FOODS ARE NOT RECOMMENDED? Grains Instant hot cereals. Bread stuffing, pancake, and biscuit mixes. Croutons. Seasoned rice or pasta mixes. Noodle soup cups. Boxed or frozen macaroni and cheese. Self-rising flour. Regular salted crackers. Vegetables Regular canned vegetables. Regular canned tomato sauce and paste. Regular tomato and vegetable juices. Frozen vegetables in sauces. Salted Jamaica[place] fries. Olives. Rosita FirePickles. Relishes. Sauerkraut. Salsa. Meat and Other Protein Products Salted, canned, smoked, spiced, or pickled meats, seafood, or fish. Bacon, ham, sausage, hot dogs, corned beef, chipped beef, and packaged luncheon meats. Salt pork. Jerky. Pickled herring. Anchovies, regular  canned tuna, and sardines. Salted nuts. Dairy Processed cheese and cheese spreads. Cheese curds. Blue cheese and cottage cheese. Buttermilk.  Condiments Onion and garlic salt, seasoned salt, table salt, and sea salt. Canned and packaged gravies. Worcestershire sauce. Tartar sauce. Barbecue sauce. Teriyaki sauce. Soy sauce, including reduced sodium. Steak sauce. Fish sauce. Oyster sauce. Cocktail sauce. Horseradish that you find on the shelf. Regular ketchup and mustard. Meat flavorings and tenderizers. Bouillon cubes. Hot sauce. Tabasco sauce. Marinades. Taco seasonings. Relishes. Fats and Oils Regular salad dressings. Salted butter. Margarine. Ghee. Bacon fat.  Other Potato and tortilla chips. Corn chips and puffs. Salted popcorn and pretzels. Canned or dried soups. Pizza. Frozen entrees and pot pies.  The items listed above may not be a complete list of foods and beverages to avoid. Contact your dietitian for more information.  This information is not intended to replace advice given to you by your health care provider. Make sure you discuss any questions you have with your health care provider.   Document Released: 02/28/2002 Document Revised: 09/29/2014 Document Reviewed: 07/13/2013 Elsevier Interactive Patient Education 2016 Elsevier Inc.  - Hypertension Hypertension is another name for high blood pressure. High blood pressure forces your heart to work harder to pump blood. A blood pressure reading has two numbers, which includes a higher number over a lower number (example: 110/72). HOME CARE   Have your blood pressure rechecked by your doctor.  Only take medicine as told by your doctor. Follow the directions carefully. The medicine does not work as well if you skip doses. Skipping doses also puts you at risk for problems.  Do not smoke.  Monitor your blood pressure at home as told by your doctor. GET HELP IF:  You think you are having a reaction to the medicine you are  taking.  You have repeat headaches or feel dizzy.  You have puffiness (swelling) in your ankles.  You have trouble with your vision. GET HELP RIGHT AWAY IF:   You get a very bad headache and are confused.  You feel weak, numb, or faint.  You get chest or belly (abdominal) pain.  You throw up (vomit).  You cannot breathe very well. MAKE SURE YOU:   Understand these instructions.  Will watch your condition.  Will get help right away if you are not doing well or get worse.   This information is not intended to replace advice given to you by your health care provider. Make sure you discuss any questions you have with your health care provider.   Document Released: 02/25/2008 Document Revised: 09/13/2013 Document Reviewed: 07/01/2013 Elsevier Interactive Patient Education [employer]

## 2016-04-01 NOTE — Progress Notes (Addendum)
[name], is a 55 y.o. male  UJW:[id number]  [id number]  DOB - [birth year]  CC:  Chief Complaint  Patient presents with  . Follow-up    ED        HPI: [name] is a 55 y.o. male here today to establish medical care, New to our clinic.  has not seen a doctor in a long time except for the ER doctors, and is currently homeless.  Patient was evaluated in the ED on 03/17/2016 for headache, found to be mildly dehydrated. They did a CT of the head as well as MRI of the head which was negative. After fluids, patient felt better and headaches improved.  Currently, he denies any headaches.   He is currently homeless and living in a tent with he admits to eating whatever is available, which is a lot of fast food most of the time salty foods in general. He also smokes for the last 40 years and smokes, about a pack a day, not interested in stopping.  Pt tells me he was on Mychart (via his ipad) which he turned on during exam and told me he was diagnosed w/ Chronic kidney disease (on the labs) since his hernia surgery in 2009, but was never told this by a doctor.  Patient has No headache, No chest pain, No abdominal pain - No Nausea, No new weakness tingling or numbness, No Cough - SOB.  Pt initially expressed interest on pneumococcal and tdap vaccine when I discussed with him about it, as well has psa/prostate screening.    Review of Systems: Per HPI, o/w all systems reviewed and negative.   No Known Allergies Past Medical History  Diagnosis Date  . Depression    No current outpatient prescriptions on file prior to visit.   No current facility-administered medications on file prior to visit.   No family history on file. Social History   Social History  . Marital Status: Single    Spouse Name: N/A  . Number of Children: N/A  . Years of Education: N/A   Occupational History  . Not on file.   Social History Main Topics  . Smoking status: Current Every Day  Smoker  . Smokeless tobacco: Not on file  . Alcohol Use: Not on file  . Drug Use: Not on file  . Sexual Activity: Not on file   Other Topics Concern  . Not on file   Social History Narrative    Objective:   Filed Vitals:   04/01/16 1548  BP: 163/98  Pulse: 108  Temp: 98.3 F (36.8 C)  Resp: 16    Filed Weights   04/01/16 1548  Weight: 147 lb 6.4 oz (66.86 kg)    BP Readings from Last 3 Encounters:  04/01/16 163/98  03/17/16 145/86    Physical Exam: Constitutional: thin appearing, anxious male.  No distress. AAOx3 HENT: Normocephalic, atraumatic, External right and left ear normal. Oropharynx is clear and moist.  Eyes: Conjunctivae and EOM are normal. PERRL, no scleral icterus. Neck: Normal ROM. Neck supple. No JVD.  CVS: RRR, S1/S2 +, no murmurs, no gallops, no carotid bruit.  Pulmonary: Effort and breath sounds normal, no stridor, rhonchi, wheezes, rales.  Abdominal: Soft. BS +, no distension Musculoskeletal: Normal range of motion. No edema and no tenderness.  LE: bilat/ no c/c/e, pulses 2+ bilateral. Neuro: Alert. muscle tone coordination wnl. No cranial nerve deficit grossly. Skin: Skin is warm and dry. No rash noted. Not diaphoretic. No erythema. No pallor.  2 small bug bites noted on his right flank region, no infection noted. Psychiatric: Normal mood and affect. Behavior, judgment, thought content normal for most part, but fixated on the "CKD" lab noted on his Mychart, attempting to turn on his ipad/mychart throughout the visit to show me his "CKD" and did not pay attention much to my discusions about his medical history throughout h&p or exam.  Lab Results  Component Value Date   WBC 14.8* 03/17/2016   HGB 17.6* 03/17/2016   HCT 52.4* 03/17/2016   MCV 99.4 03/17/2016   PLT 244 03/17/2016   Lab Results  Component Value Date   CREATININE 1.20 03/17/2016   BUN 10 03/17/2016   NA 137 03/17/2016   K 4.1 03/17/2016   CL 99* 03/17/2016   CO2 24  03/17/2016    No results found for: HGBA1C Lipid Panel  No results found for: CHOL, TRIG, HDL, CHOLHDL, VLDL, LDLCALC     Depression screen PHQ 2/9 04/01/2016  Decreased Interest 3  Down, Depressed, Hopeless 3  PHQ - 2 Score 6  Altered sleeping 3  Tired, decreased energy 3  Change in appetite 3  Feeling bad or failure about yourself  3  Trouble concentrating 3  Moving slowly or fidgety/restless 3  Suicidal thoughts 0  PHQ-9 Score 24  Difficult doing work/chores Not difficult at all    Assessment and plan:   1. Essential hypertension - Uncontrolled, suspect due to high salt load/diet/fast foods, give tachycardia as well,  - recd metoprolol 25 bid for now, low salt diet as much as feasable, and eat fresher foods/healthier food choices such as Lance Muss if he has to choose btw fast foods.   2. Tobacco abuse disorder tob cessation recd, not interested  3. Need for prophylactic vaccination against Streptococcus pneumoniae (pneumococcus) Initially pt was agreable, but when the CMA when to give him the injection, he declined this and other vaccines (TDAP) /labs test and left. - pneumococcal 23 valent vaccine - NOT given.  4. Homeless single person Living in tent [place], makes healthcare /salt limit restrictions difficulty. - working on Education officer, museum housing, per pt - he states he is not sure if he will be back to this clinic, I am not even certain if he will pick up the bp meds.  5. Encounter for prostate cancer screening - pt initially agreed to psa testing, than changed his mind and left. - PSA, Medicare  6. Seasonal allergic rhinitis Taking otc allergy meds. - rx laratadine  7. Diabetes mellitus screening Recd, not certain if did. - POCT glycosylated hemoglobin (Hb A1C)  8. Renal function - normal Reassured pt numerous times that his renal function is normal. By end of visit, pt able to open his Mychart and showed me where he read the CKD.  I showed him that that was  for ppl w/ gfr <60, which he dose not have.    9. 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  Return in about 3 months (around 07/02/2016).  The patient was given clear instructions to go to ER or return to medical center if symptoms don't improve, worsen or new problems develop. The patient verbalized understanding. The patient was told to call to get lab results if they haven't heard anything in the next week.    This note has been created with Education officer, environmental. Any transcriptional errors are unintentional.   Pete Glatter, MD, MBA/MHA  [facility] [place], [place] [phone]   04/01/2016, 4:40 PM

## 2016-09-11 ENCOUNTER — Emergency Department (HOSPITAL_COMMUNITY): Payer: Medicaid Other

## 2016-09-11 ENCOUNTER — Encounter (HOSPITAL_COMMUNITY): Payer: Self-pay

## 2016-09-11 ENCOUNTER — Emergency Department (HOSPITAL_COMMUNITY)
Admission: EM | Admit: 2016-09-11 | Discharge: 2016-09-11 | Disposition: A | Discharge status: Home / Self Care | Payer: Medicaid Other | Attending: Emergency Medicine | Admitting: Emergency Medicine

## 2016-09-11 DIAGNOSIS — E119 Type 2 diabetes mellitus without complications: Secondary | ICD-10-CM | POA: Diagnosis not present

## 2016-09-11 DIAGNOSIS — F17210 Nicotine dependence, cigarettes, uncomplicated: Secondary | ICD-10-CM | POA: Diagnosis not present

## 2016-09-11 DIAGNOSIS — Y999 Unspecified external cause status: Secondary | ICD-10-CM | POA: Insufficient documentation

## 2016-09-11 DIAGNOSIS — Y939 Activity, unspecified: Secondary | ICD-10-CM | POA: Diagnosis not present

## 2016-09-11 DIAGNOSIS — I10 Essential (primary) hypertension: Secondary | ICD-10-CM | POA: Insufficient documentation

## 2016-09-11 DIAGNOSIS — K029 Dental caries, unspecified: Secondary | ICD-10-CM | POA: Diagnosis not present

## 2016-09-11 DIAGNOSIS — K0889 Other specified disorders of teeth and supporting structures: Secondary | ICD-10-CM | POA: Insufficient documentation

## 2016-09-11 DIAGNOSIS — Y9289 Other specified places as the place of occurrence of the external cause: Secondary | ICD-10-CM | POA: Insufficient documentation

## 2016-09-11 HISTORY — DX: Bipolar disorder, unspecified: F31.9

## 2016-09-11 HISTORY — DX: Schizoaffective disorder, bipolar type: F25.0

## 2016-09-11 HISTORY — DX: Type 2 diabetes mellitus without complications: E11.9

## 2016-09-11 HISTORY — DX: Alcohol dependence, uncomplicated: F10.20

## 2016-09-11 HISTORY — DX: Schizoaffective disorder, unspecified: F25.9

## 2016-09-11 MED ORDER — ACETAMINOPHEN 325 MG PO TABS
650.0000 mg | ORAL_TABLET | Freq: Once | ORAL | Status: AC
  Administered 2016-09-11: 650 mg via ORAL
  Filled 2016-09-11: qty 2
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
## 2016-09-11 NOTE — ED Triage Notes (Signed)
Patient via [facility] c/o mouth pain. And alcohol intoxication. Patient states he had his wisdom teeth removed today and is having bilateral jaw pain. Patient not cooperative during triage
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
## 2016-09-11 NOTE — ED Notes (Signed)
Patient refused to get his CT scan done.
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
## 2016-09-11 NOTE — ED Notes (Addendum)
Pt now taking pics of staff on tablet.  Pt yelling, "where's my doctor?  Where is my medical doctor."  Pt denying SI/HI.

## 2017-04-30 ENCOUNTER — Inpatient Hospital Stay (HOSPITAL_COMMUNITY)
Admission: AD | Admit: 2017-04-30 | Discharge: 2017-05-06 | DRG: 885 | Disposition: A | Discharge status: Home / Self Care | Payer: Medicaid Other | Attending: Psychiatry | Admitting: Psychiatry

## 2017-04-30 ENCOUNTER — Encounter (HOSPITAL_COMMUNITY): Payer: Self-pay | Admitting: *Deleted

## 2017-04-30 DIAGNOSIS — Z716 Tobacco abuse counseling: Secondary | ICD-10-CM

## 2017-04-30 DIAGNOSIS — F209 Schizophrenia, unspecified: Secondary | ICD-10-CM | POA: Diagnosis present

## 2017-04-30 DIAGNOSIS — F1910 Other psychoactive substance abuse, uncomplicated: Secondary | ICD-10-CM | POA: Diagnosis not present

## 2017-04-30 DIAGNOSIS — G4700 Insomnia, unspecified: Secondary | ICD-10-CM | POA: Diagnosis present

## 2017-04-30 DIAGNOSIS — Z590 Homelessness: Secondary | ICD-10-CM

## 2017-04-30 DIAGNOSIS — F332 Major depressive disorder, recurrent severe without psychotic features: Secondary | ICD-10-CM | POA: Diagnosis not present

## 2017-04-30 DIAGNOSIS — T148XXA Other injury of unspecified body region, initial encounter: Secondary | ICD-10-CM | POA: Diagnosis present

## 2017-04-30 DIAGNOSIS — R413 Other amnesia: Secondary | ICD-10-CM | POA: Diagnosis not present

## 2017-04-30 DIAGNOSIS — Z560 Unemployment, unspecified: Secondary | ICD-10-CM | POA: Diagnosis not present

## 2017-04-30 DIAGNOSIS — W57XXXA Bitten or stung by nonvenomous insect and other nonvenomous arthropods, initial encounter: Secondary | ICD-10-CM | POA: Diagnosis present

## 2017-04-30 DIAGNOSIS — F17210 Nicotine dependence, cigarettes, uncomplicated: Secondary | ICD-10-CM | POA: Diagnosis not present

## 2017-04-30 DIAGNOSIS — R21 Rash and other nonspecific skin eruption: Secondary | ICD-10-CM | POA: Diagnosis present

## 2017-04-30 DIAGNOSIS — I10 Essential (primary) hypertension: Secondary | ICD-10-CM | POA: Diagnosis present

## 2017-04-30 DIAGNOSIS — Y9289 Other specified places as the place of occurrence of the external cause: Secondary | ICD-10-CM | POA: Diagnosis not present

## 2017-04-30 DIAGNOSIS — F1020 Alcohol dependence, uncomplicated: Secondary | ICD-10-CM | POA: Diagnosis present

## 2017-04-30 DIAGNOSIS — R45851 Suicidal ideations: Secondary | ICD-10-CM | POA: Diagnosis present

## 2017-04-30 DIAGNOSIS — M109 Gout, unspecified: Secondary | ICD-10-CM | POA: Diagnosis present

## 2017-04-30 DIAGNOSIS — R4189 Other symptoms and signs involving cognitive functions and awareness: Secondary | ICD-10-CM | POA: Diagnosis present

## 2017-04-30 DIAGNOSIS — R51 Headache: Secondary | ICD-10-CM | POA: Diagnosis present

## 2017-04-30 DIAGNOSIS — F39 Unspecified mood [affective] disorder: Secondary | ICD-10-CM | POA: Diagnosis not present

## 2017-04-30 DIAGNOSIS — Z79899 Other long term (current) drug therapy: Secondary | ICD-10-CM | POA: Diagnosis not present

## 2017-04-30 DIAGNOSIS — F419 Anxiety disorder, unspecified: Secondary | ICD-10-CM | POA: Diagnosis not present

## 2017-04-30 DIAGNOSIS — Z634 Disappearance and death of family member: Secondary | ICD-10-CM | POA: Diagnosis not present

## 2017-04-30 DIAGNOSIS — F333 Major depressive disorder, recurrent, severe with psychotic symptoms: Secondary | ICD-10-CM | POA: Diagnosis not present

## 2017-04-30 LAB — LIPID PANEL
Cholesterol: 233 mg/dL — ABNORMAL HIGH (ref 0–200)
HDL: 46 mg/dL (ref >40)
LDL CALC: 142 mg/dL — AB (ref 0–99)
TRIGLYCERIDES: 224 mg/dL — AB (ref <150)
Total CHOL/HDL Ratio: 5.1 RATIO
VLDL: 45 mg/dL — AB (ref 0–40)

## 2017-04-30 LAB — COMPREHENSIVE METABOLIC PANEL
ALBUMIN: 4.2 g/dL (ref 3.5–5.0)
ALT: 10 U/L — AB (ref 17–63)
AST: 16 U/L (ref 15–41)
Alkaline Phosphatase: 76 U/L (ref 38–126)
Anion gap: 8 (ref 5–15)
BILIRUBIN TOTAL: 0.9 mg/dL (ref 0.3–1.2)
BUN: 15 mg/dL (ref 6–20)
CHLORIDE: 103 mmol/L (ref 101–111)
CO2: 27 mmol/L (ref 22–32)
CREATININE: 1.01 mg/dL (ref 0.61–1.24)
Calcium: 9 mg/dL (ref 8.9–10.3)
GFR calc Af Amer: >60 mL/min (ref >60)
GFR calc non Af Amer: >60 mL/min (ref >60)
GLUCOSE: 93 mg/dL (ref 65–99)
POTASSIUM: 4.1 mmol/L (ref 3.5–5.1)
Sodium: 138 mmol/L (ref 135–145)
Total Protein: 7.1 g/dL (ref 6.5–8.1)

## 2017-04-30 LAB — HEMOGLOBIN A1C
Hgb A1c MFr Bld: 5.3 % (ref 4.8–5.6)
Mean Plasma Glucose: 105.41 mg/dL

## 2017-04-30 LAB — CBC
HEMATOCRIT: 41.5 % (ref 39.0–52.0)
Hemoglobin: 14 g/dL (ref 13.0–17.0)
MCH: 31.9 pg (ref 26.0–34.0)
MCHC: 33.7 g/dL (ref 30.0–36.0)
MCV: 94.5 fL (ref 78.0–100.0)
PLATELETS: 368 10*3/uL (ref 150–400)
RBC: 4.39 MIL/uL (ref 4.22–5.81)
RDW: 14.3 % (ref 11.5–15.5)
WBC: 9.8 10*3/uL (ref 4.0–10.5)

## 2017-04-30 LAB — TSH: TSH: 3.164 u[IU]/mL (ref 0.350–4.500)

## 2017-04-30 MED ORDER — TRAZODONE HCL 50 MG PO TABS: 50.0000 mg | ORAL_TABLET | Freq: Every evening | ORAL | Status: DC | PRN

## 2017-04-30 MED ORDER — ACETAMINOPHEN 325 MG PO TABS
650.0000 mg | ORAL_TABLET | Freq: Four times a day (QID) | ORAL | Status: DC | PRN
  Administered 2017-05-01 – 2017-05-06 (×8): 650 mg via ORAL
  Filled 2017-04-30 (×8): qty 2

## 2017-04-30 MED ORDER — HYDROCORTISONE 0.5 % EX CREA
TOPICAL_CREAM | Freq: Three times a day (TID) | CUTANEOUS | Status: DC
  Administered 2017-05-01 – 2017-05-03 (×3): via TOPICAL
  Filled 2017-04-30: qty 28.35

## 2017-04-30 MED ORDER — ALUM & MAG HYDROXIDE-SIMETH 200-200-20 MG/5ML PO SUSP
30.0000 mL | ORAL | Status: DC | PRN
  Administered 2017-05-05: 30 mL via ORAL
  Filled 2017-04-30: qty 30

## 2017-04-30 MED ORDER — HYDROXYZINE HCL 25 MG PO TABS
25.0000 mg | ORAL_TABLET | Freq: Four times a day (QID) | ORAL | Status: DC | PRN
  Administered 2017-05-01 (×2): 25 mg via ORAL
  Filled 2017-04-30 (×2): qty 1

## 2017-04-30 MED ORDER — MAGNESIUM HYDROXIDE 400 MG/5ML PO SUSP: 30.0000 mL | Freq: Every day | ORAL | Status: DC | PRN

## 2017-04-30 NOTE — Progress Notes (Signed)
Admission Note:  56 year old male who presents voluntary, in no acute distress, for the treatment of SI and Substance Abuse. Patient appears flat and depressed, and guarded on admission. Patient was calm and cooperative with admission process. Patient presents with passive SI and contracts for safety upon admission. Patient denies AVH. Patient reports drinking "10 drinks" per week. Patient identifies main stressor as "living in the woods".  Patient is unable to identify a support system.  [facility] at [facility], patient would like to work on "focus" and "purpose".  Patient's skin was assessed.  Patient has rash/bug bites on his upper forearms bilateral and a scratch to his left forearm.  Patient searched and no contraband found, POC and unit policies explained and understanding verbalized. Consents obtained. Food and fluids offered, and fluids accepted. Patient had no additional questions or concerns.

## 2017-04-30 NOTE — Progress Notes (Signed)
Urine specimen cup given to Pt. Pt is aware. Pending sample.

## 2017-04-30 NOTE — Social Work (Signed)
Referred to [facility], is [place] resident.  Anne Cunningham, LCSW Lead Clinical Social Worker Phone:  [phone]  

## 2017-04-30 NOTE — H&P (Signed)
Behavioral Health Medical Screening Exam  [name] is an 56 y.o. male.  Total Time spent with patient: 20 minutes  Psychiatric Specialty Exam: Physical Exam  Nursing note and vitals reviewed. Constitutional: He is oriented to person, place, and time. He appears well-developed.  Cardiovascular: Normal rate.   Respiratory: Effort normal.  Musculoskeletal: Normal range of motion.  Neurological: He is alert and oriented to person, place, and time.  Skin: Skin is warm.    Review of Systems  Constitutional: Negative.   HENT: Negative.   Eyes: Negative.   Respiratory: Negative.   Cardiovascular: Negative.   Gastrointestinal: Negative.   Genitourinary: Negative.   Musculoskeletal: Negative.   Skin: Positive for itching (numerous mosquitoe bites) and rash (RUE inner aspect of bicep).  Neurological: Positive for headaches (frequent but ibuprofen resolves them).  Endo/Heme/Allergies: Negative.     There were no vitals taken for this visit.There is no height or weight on file to calculate BMI.  General Appearance: Casual and Disheveled  Eye Contact:  Fair  Speech:  Clear and Coherent and Normal Rate  Volume:  Normal  Mood:  Depressed  Affect:  Depressed and Flat  Thought Process:  Coherent and Descriptions of Associations: Intact  Orientation:  Full (Time, Place, and Person)  Thought Content:  WDL  Suicidal Thoughts:  Yes.  without intent/plan  Homicidal Thoughts:  No  Memory:  Immediate;   Good Recent;   Good  Judgement:  Fair  Insight:  Fair  Psychomotor Activity:  Normal  Concentration: Concentration: Fair and Attention Span: Fair  Recall:  Good  Fund of Knowledge:Fair  Language: Good  Akathisia:  No  Handed:  Right  AIMS (if indicated):     Assets:  Desire for Improvement  Sleep:       Musculoskeletal: Strength & Muscle Tone: within normal limits Gait & Station: normal Patient leans: N/A  There were no vitals taken for this  visit.  Recommendations:  Based on my evaluation the patient does not appear to have an emergency medical condition.  Gerlene Burdockravis B Marelin Tat, FNP 04/30/2017, 1:58 PM

## 2017-04-30 NOTE — BH Assessment (Signed)
Tele Assessment Note   [name] is an 56 y.o. male who presents voluntarily accompanied by reporting symptoms of depression and suicidal ideation with no plan or history of attempts.  Pt states that he has been on a downward spiral since his GF died 2 years ago, and he curently isolates himself from everyone and lives in the woods. Pt has a history of alcohol dependence, Schizophrenia/Schizoaffective Disorder and Bipolar per his chart. He currently denies AVH andparanoia, but appears guarded. He states that he drinks 2 24 oz beers daily (last drink last night), but says he can go "months" without drinking. He states that he does get the shakes when he doesn't drink. He states that he has been hospitalized in the past for mental health reasons, but he does not remember where or when other than "a long time ago".  Pt reports he tried at one point Abilify and Zoloft, but it did not work.  He states that he used to go to [facility]. [facility], but has not had any treatment in a long time. "If I leave, I will just go back to the woods to my bottle of wine, and I am going downhill".   Pt acknowledges symptoms including: sleeplessness, loss of appetite. PT denies homicidal ideation/ history of violence.  Pt states current stressors include his living situation. Pt has no supports, and gets his mail form the post office. Pt denies history of abuse and trauma. Pt denies family history of MH or SA issues. Pt has poor insight and judgment. Pt's memory appears impaired. Pt denies legal history. ? MSE: Pt is casually dressed with poor grooming, alert, oriented x4 with normal speech and normal motor behavior. Eye contact is fair. Pt's mood is depressed and affect is depressed and guarded. Affect is congruent with mood. Thought process is coherent and relevant. There is no indication Pt is currently responding to internal stimuli or experiencing delusional thought content. Pt was cooperative throughout assessment. Pt  is currently unable to contract for safety outside the hospital and wants inpatient psychiatric treatment.  Per Reola Calkins, FNP, pt meets IP criteria, per Miki Kins, pt accepted to [facility] 302-2.    Diagnosis: MDD without psychotic features, Substance Abuse Disorder  Past Medical History:  Past Medical History:  Diagnosis Date  . Alcoholic (HCC)   . Bipolar 1 disorder (HCC)   . Depression   . Diabetes mellitus without complication (HCC)   . Schizoaffective schizophrenia [facility])     Past Surgical History:  Procedure Laterality Date  . DENTAL SURGERY     wisdom teeth    Family History: No family history on file.  Social History:  reports that he has been smoking Cigarettes.  He has been smoking about 2.00 packs per day. He has never used smokeless tobacco. He reports that he drinks alcohol. He reports that he does not use drugs.  Additional Social History:  Alcohol / Drug Use Pain Medications: denies Prescriptions: denies Over the Counter: denies History of alcohol / drug use?: Yes Longest period of sobriety (when/how long): "months" Negative Consequences of Use: Financial, Personal relationships Withdrawal Symptoms:  (none) Substance #1 Name of Substance 1: alcohol 1 - Age of First Use: 16 1 - Amount (size/oz): 48 oz of beer 1 - Frequency: daily 1 - Duration: ongoing 1 - Last Use / Amount: last night  CIWA:   COWS:    PATIENT STRENGTHS: (choose at least two) Capable of independent living Communication skills  Allergies: No Known  Allergies  Home Medications:  No prescriptions prior to admission.    OB/GYN Status:  No LMP for male patient.  General Assessment Data Assessment unable to be completed: Yes [facility] Assessment: In system Is this a Tele or Face-to-Face Assessment?: Face-to-Face Is this an Initial Assessment or a Re-assessment for this encounter?: Initial Assessment Marital status: Single Living  Arrangements:  (homeless) Can pt return to current living arrangement?: Yes Admission Status: Voluntary Is patient capable of signing voluntary admission?: Yes Referral Source: Self/Family/Friend Insurance type: MCD  Medical Screening Exam [facility] Walk-in ONLY) Medical Exam completed: Yes  Crisis Care Plan Living Arrangements:  (homeless) Name of Psychiatrist: none Name of Therapist: none  Education Status Is patient currently in school?: No  Risk to self with the past 6 months Suicidal Ideation: Yes-Currently Present Has patient been a risk to self within the past 6 months prior to admission? : No Suicidal Intent: No Has patient had any suicidal intent within the past 6 months prior to admission? : No Is patient at risk for suicide?: Yes Suicidal Plan?: No Has patient had any suicidal plan within the past 6 months prior to admission? : No Access to Means: No What has been your use of drugs/alcohol within the last 12 months?: see SA Previous Attempts/Gestures: No Other Self Harm Risks:  (none kown) Intentional Self Injurious Behavior: None Family Suicide History: No Recent stressful life event(s): Financial Problems, Loss (Comment) (homeless, GF died) Persecutory voices/beliefs?: No Depression: Yes Depression Symptoms: Insomnia, Isolating, Fatigue, Loss of interest in usual pleasures, Feeling angry/irritable, Feeling worthless/self pity Substance abuse history and/or treatment for substance abuse?: Yes Suicide prevention information given to non-admitted patients: Not applicable  Risk to Others within the past 6 months Homicidal Ideation: No Does patient have any lifetime risk of violence toward others beyond the six months prior to admission? : Unknown Thoughts of Harm to Others: No Current Homicidal Intent: No Current Homicidal Plan: No Access to Homicidal Means: No History of harm to others?: No Assessment of Violence: None Noted Does patient have access to weapons?:  No Criminal Charges Pending?: No Does patient have a court date: No Is patient on probation?: No  Psychosis Hallucinations: None noted Delusions: None noted  Mental Status Report Appearance/Hygiene: Poor hygiene Eye Contact: Fair Motor Activity: Unremarkable Speech: Logical/coherent Level of Consciousness: Alert Mood: Depressed, Irritable, Anxious Affect: Anxious, Depressed, Irritable Anxiety Level: Moderate Thought Processes: Coherent, Relevant Judgement: Partial Orientation: Person, Place, Time, Situation, Appropriate for developmental age Obsessive Compulsive Thoughts/Behaviors: None  Cognitive Functioning Concentration: Normal Memory: Recent Intact, Remote Intact IQ: Average Insight: Poor Impulse Control: Fair Appetite: Poor Weight Loss:  (20 lbs) Weight Gain:  (0) Sleep: Decreased Total Hours of Sleep:  ("i don't dream--nap on and off") Vegetative Symptoms: Decreased grooming  ADLScreening [facility] Assessment Services) Patient's cognitive ability adequate to safely complete daily activities?: Yes Patient able to express need for assistance with ADLs?: Yes Independently performs ADLs?: Yes (appropriate for developmental age)  Prior Inpatient Therapy Prior Inpatient Therapy: Yes Prior Therapy Dates:  (unknown) Prior Therapy Facilty/Provider(s):  (unknown) Reason for Treatment:  (depression)  Prior Outpatient Therapy Prior Outpatient Therapy: Yes Prior Therapy Dates: at least 6 years ago Prior Therapy Facilty/Provider(s): [facility] Reason for Treatment: depressio, SA Does patient have an ACCT team?: No Does patient have Intensive In-House Services?  : No Does patient have [facility] services? : No Does patient have [facility] services?: No  ADL Screening (condition at time of admission)  Patient's cognitive ability adequate to safely complete daily activities?: Yes Is the patient deaf or have difficulty hearing?: No Does the patient have difficulty seeing, even  when wearing glasses/contacts?: No Does the patient have difficulty concentrating, remembering, or making decisions?: Yes Patient able to express need for assistance with ADLs?: Yes Does the patient have difficulty dressing or bathing?: No Independently performs ADLs?: Yes (appropriate for developmental age) Does the patient have difficulty walking or climbing stairs?: No Weakness of Legs: None Weakness of Arms/Hands: None  Home Assistive Devices/Equipment Home Assistive Devices/Equipment: None    Abuse/Neglect Assessment (Assessment to be complete while patient is alone) Physical Abuse: Denies Verbal Abuse: Denies Sexual Abuse: Denies Exploitation of patient/patient's resources: Denies Self-Neglect: Denies Values / Beliefs Cultural Requests During Hospitalization: None Spiritual Requests During Hospitalization: None   Advance Directives (For Healthcare) Does Patient Have a Medical Advance Directive?: No Would patient like information on creating a medical advance directive?: No - Patient declined    Additional Information 1:1 In Past 12 Months?: No CIRT Risk: No Elopement Risk: Yes Does patient have medical clearance?: No     Disposition:  Disposition Initial Assessment Completed for this Encounter: Yes Disposition of Patient: Inpatient treatment program Type of inpatient treatment program: Adult  [facility] 04/30/2017 2:10 PM

## 2017-04-30 NOTE — Tx Team (Signed)
Initial Treatment Plan 04/30/2017 8:24 PM [name] [id number]    PATIENT STRESSORS: Financial difficulties Loss of relationship Substance abuse   PATIENT STRENGTHS: Ability for insight Communication skills Motivation for treatment/growth   PATIENT IDENTIFIED PROBLEMS: At risk for suicide  Substance Abuse  "focus"  "purpose"               DISCHARGE CRITERIA:  Ability to meet basic life and health needs Adequate post-discharge living arrangements Improved stabilization in mood, thinking, and/or behavior Motivation to continue treatment in a less acute level of care Need for constant or close observation no longer present Withdrawal symptoms are absent or subacute and managed without 24-hour nursing intervention  PRELIMINARY DISCHARGE PLAN: Attend 12-step recovery group Outpatient therapy Placement in alternative living arrangements  PATIENT/FAMILY INVOLVEMENT: This treatment plan has been presented to and reviewed with the patient, [name].  The patient and family have been given the opportunity to ask questions and make suggestions.  Carleene OverlieMiddleton, Johonna Binette P, RN 04/30/2017, 8:24 PM

## 2017-04-30 NOTE — Progress Notes (Addendum)
  DATA ACTION RESPONSE  Objective- Pt. is visible in the room, seen resting in bed with eyes closed. Pt was awaken for assessment. Presents with a depressed/irritable     affect and mood. Appears suspicious and cautious on approach with intense eye contact.   Subjective- Denies having any HI/AVH/Pain at this time. Endorses passive SI but verbal contracts for safety. Is cooperative and remain safe on the unit.  1:1 interaction in private to establish rapport. Encouragement, education, & support given from staff.      Safety maintained with Q 15 checks. Continue with POC.

## 2017-05-01 DIAGNOSIS — F39 Unspecified mood [affective] disorder: Secondary | ICD-10-CM

## 2017-05-01 DIAGNOSIS — Z590 Homelessness: Secondary | ICD-10-CM

## 2017-05-01 DIAGNOSIS — F17210 Nicotine dependence, cigarettes, uncomplicated: Secondary | ICD-10-CM

## 2017-05-01 DIAGNOSIS — F333 Major depressive disorder, recurrent, severe with psychotic symptoms: Secondary | ICD-10-CM

## 2017-05-01 DIAGNOSIS — F419 Anxiety disorder, unspecified: Secondary | ICD-10-CM

## 2017-05-01 DIAGNOSIS — Z634 Disappearance and death of family member: Secondary | ICD-10-CM

## 2017-05-01 DIAGNOSIS — G4700 Insomnia, unspecified: Secondary | ICD-10-CM

## 2017-05-01 MED ORDER — IBUPROFEN 600 MG PO TABS
600.0000 mg | ORAL_TABLET | Freq: Three times a day (TID) | ORAL | Status: DC | PRN
  Administered 2017-05-02 – 2017-05-05 (×8): 600 mg via ORAL
  Filled 2017-05-01 (×9): qty 1

## 2017-05-01 MED ORDER — MIRTAZAPINE 7.5 MG PO TABS
7.5000 mg | ORAL_TABLET | Freq: Every day | ORAL | Status: DC
  Administered 2017-05-04: 7.5 mg via ORAL
  Filled 2017-05-01 (×6): qty 1

## 2017-05-01 MED ORDER — ADULT MULTIVITAMIN W/MINERALS CH
1.0000 | ORAL_TABLET | Freq: Every day | ORAL | Status: DC
  Administered 2017-05-03 – 2017-05-06 (×4): 1 via ORAL
  Filled 2017-05-01 (×7): qty 1

## 2017-05-01 MED ORDER — VITAMIN B-1 100 MG PO TABS
100.0000 mg | ORAL_TABLET | Freq: Every day | ORAL | Status: DC
  Administered 2017-05-03 – 2017-05-06 (×4): 100 mg via ORAL
  Filled 2017-05-01 (×6): qty 1

## 2017-05-01 MED ORDER — ONDANSETRON 4 MG PO TBDP: 4.0000 mg | ORAL_TABLET | Freq: Four times a day (QID) | ORAL | Status: AC | PRN

## 2017-05-01 MED ORDER — CHLORDIAZEPOXIDE HCL 25 MG PO CAPS
25.0000 mg | ORAL_CAPSULE | Freq: Four times a day (QID) | ORAL | Status: AC | PRN
  Administered 2017-05-02: 25 mg via ORAL
  Filled 2017-05-01: qty 1

## 2017-05-01 MED ORDER — RISPERIDONE 0.5 MG PO TABS
0.5000 mg | ORAL_TABLET | Freq: Every day | ORAL | Status: DC
  Filled 2017-05-01 (×2): qty 1

## 2017-05-01 MED ORDER — RISPERIDONE 0.5 MG PO TABS
ORAL_TABLET | ORAL | Status: AC
  Filled 2017-05-01: qty 1

## 2017-05-01 MED ORDER — RISPERIDONE 1 MG PO TABS
1.0000 mg | ORAL_TABLET | Freq: Every day | ORAL | Status: DC
  Filled 2017-05-01 (×5): qty 1

## 2017-05-01 MED ORDER — RISPERIDONE 0.5 MG PO TABS
0.5000 mg | ORAL_TABLET | Freq: Once | ORAL | Status: AC
  Administered 2017-05-01: 0.5 mg via ORAL
  Filled 2017-05-01: qty 1

## 2017-05-01 MED ORDER — HYDROXYZINE HCL 25 MG PO TABS
25.0000 mg | ORAL_TABLET | Freq: Four times a day (QID) | ORAL | Status: AC | PRN
  Administered 2017-05-02 – 2017-05-04 (×5): 25 mg via ORAL
  Filled 2017-05-01 (×6): qty 1

## 2017-05-01 MED ORDER — LOPERAMIDE HCL 2 MG PO CAPS: 2.0000 mg | ORAL_CAPSULE | ORAL | Status: AC | PRN

## 2017-05-01 MED ORDER — INDOMETHACIN 25 MG PO CAPS: 50.0000 mg | ORAL_CAPSULE | Freq: Three times a day (TID) | ORAL | Status: DC | PRN

## 2017-05-01 MED ORDER — THIAMINE HCL 100 MG/ML IJ SOLN: 100.0000 mg | Freq: Once | INTRAMUSCULAR | Status: DC

## 2017-05-01 NOTE — Plan of Care (Signed)
Problem: Activity: Goal: Interest or engagement in activities will improve Outcome: Not Progressing PAtient continues to isolate in his room. Goal: Sleeping patterns will improve Outcome: Progressing Still napping during the day.  Problem: Coping: Goal: Ability to demonstrate self-control will improve Outcome: Progressing No inappropriate behavior this shift.  Problem: Safety: Goal: Ability to remain free from injury will improve Outcome: Progressing No injury this shift.

## 2017-05-01 NOTE — Progress Notes (Signed)
D   Pt is laying in bed with the covers pulled over his head  He is very irritable and is refusing medications and anything to drink    bp is low   He has isolated to his room and has no eye contact or interaction A    Encouraged fluids and medications   Offered verbal support   Attempted to educate patient on physical symptoms but he was not receptive     Q 15 min checks R   Pt is safe at present   Will continue to encourage medications and fluids

## 2017-05-01 NOTE — [facility] Counselor (Signed)
Pt is still symptomatic. CSW unable to complete PSA at this time.  Jonathon JordanLynn B Kallum Jorgensen, MSW, Theresia MajorsLCSWA [phone]

## 2017-05-01 NOTE — Progress Notes (Signed)
Data. Patient denies SI/HI/AVH. Patient interacting minimally with staff and other patients. Affect is blunt and doesn't brighten. Patient avoids eye contact and appears very uncomfortable with any personal interaction. Patient continues to refuse his UDS, even with prompting. EKG completed and on the chart.  Action. Emotional support and encouragement offered. Education provided on medication, indications and side effect. Q 15 minute checks done for safety. Response. Safety on the unit maintained through 15 minute checks.  Medications taken as prescribed. Remained calm.

## 2017-05-01 NOTE — [facility] Suicide Risk Assessment (Addendum)
[facility]   Nursing information obtained from:  Patient Demographic factors:  Male, Divorced or widowed, Caucasian, Low socioeconomic status, Living alone, Unemployed Current Mental Status:  Self-harm thoughts Loss Factors:  Financial problems / change in socioeconomic status Historical Factors:  NA Risk Reduction Factors:  NA  Total Time spent with patient: 45 minutes Principal Problem:  MDD, Alcohol Use Disorder  Diagnosis:   Patient Active Problem List   Diagnosis Date Noted  . MDD (major depressive disorder), recurrent episode, severe (HCC) [F33.2] 04/30/2017  . HTN (hypertension) [I10] 04/01/2016  . Homeless single person [Z59.0] 04/01/2016  . Tobacco abuse disorder [Z72.0] 04/01/2016    Continued Clinical Symptoms:  Alcohol Use Disorder Identification Test Final Score (AUDIT): 0 The "Alcohol Use Disorders Identification Test", Guidelines for Use in Primary Care, Second Edition.  World Science writerHealth Organization [facility](WHO). Score between 0-7:  no or low risk or alcohol related problems. Score between 8-15:  moderate risk of alcohol related problems. Score between 16-19:  high risk of alcohol related problems. Score 20 or above:  warrants further diagnostic evaluation for alcohol dependence and treatment.   CLINICAL FACTORS:  Patient presents as  poor historian, guarded,  irritable, answering only some questions,  states " I don't want to answer stupid questions I have already answered many times ".  Most information obtained from chart - no current source of collateral information  56 year old male , lives alone, no children, homeless, reports he lives " in the woods". On disability. Patient reported  feeling progressively depressed , developed suicidal ideations ( no specific plan endorsed)  , went to  ED voluntarily . Reported death of  He reported intermittent alcohol consumption- 2 24 ounce beers per day. Stated he has periods of time where he does not  drink, but did report " getting the shakes " when he stops drinking  ( No BAL or UDS on admission)   Chart notes indicate history of Schizophrenia/Shcizoaffective Disorder, and Alcohol Use Disorder. States he was not taking any psychiatric medications prior to admission,  Chart notes indicate he was tried on Abilify and Zoloft in the past , but that they did not work. Patient does not endorse prior  history of suicidal attempts.  He denies medical illnesses   Dx- Schizoaffective Disorder by history, Alcohol Use Disorder  Plan- inpatient admission. Started on Risperidone 1 mgrs QHS(  low dose initially, titrate gradually as tolerated) Will  start Remeron 7.5 mgrs QHS for depression, insomnia .  Will start Librium detox protocol ( PRN) to minimize risk of withdrawal.   Musculoskeletal: Strength & Muscle Tone: within normal limits- no tremors, no visible diaphoresis, but states he is feeling " hot and sweaty" Gait & Station: normal Patient leans: N/A  Psychiatric Specialty Exam: Physical Exam  ROS  Reports frequent headaches, no chest pain, no dyspnea, reports feeling " hot, sweaty". No fever. No chills   Blood pressure 135/88, pulse 74, temperature 98.1 F (36.7 C), temperature source Oral, resp. rate 16, height 6' (1.829 m), weight 67.1 kg (148 lb).Body mass index is 20.07 kg/m.  General Appearance: Fairly Groomed  Eye Contact:  Fair  Speech:  Normal Rate  Volume:  Normal  Mood:  reports depression, presents irritable, angry  Affect:  irritable, angry  Thought Process:  Linear and Descriptions of Associations: Intact- concrete, circumstantial   Orientation:  Other:  difficult to assess due to lack of cooperation, presents fully alert and attentive, states " 2018, [place]",  but does not elaborate further  Thought Content:  denies hallucinations, presents guarded,vaguely paranoid, but no delusional ideations expressed   Suicidal Thoughts:  No at this time denies suicidal plan or  intention, and denies any homicidal ideations   Homicidal Thoughts:  No  Memory:  recent and remote fair   Judgement:  Impaired  Insight:  Lacking  Psychomotor Activity:  Normal- no distal tremors noted at this time, vaguely restless   Concentration:  Concentration: Fair and Attention Span: Fair  Recall:  [employer] of Knowledge:  Fair  Language:  Poor  Akathisia:  Negative  Handed:  Right  AIMS (if indicated):     Assets:  Resilience  ADL's: fair   Cognition:  WNL- fully alert and attentive   Sleep:  Number of Hours: 6.75      COGNITIVE FEATURES THAT CONTRIBUTE TO RISK:  Closed-mindedness and Loss of executive function    SUICIDE RISK:   Moderate:  Frequent suicidal ideation with limited intensity, and duration, some specificity in terms of plans, no associated intent, good self-control, limited dysphoria/symptomatology, some risk factors present, and identifiable protective factors, including available and accessible social support.  PLAN OF CARE: Patient will be admitted to inpatient psychiatric unit for stabilization and safety. Will provide and encourage milieu participation. Provide medication management and maked adjustments as needed.  Will also provide medication management to minimize risk of withdrawal. Will follow daily.    I certify that inpatient services furnished can reasonably be expected to improve the patient's condition.   Craige Cotta, MD 05/01/2017, 4:31 PM

## 2017-05-01 NOTE — [facility] Group Notes (Signed)
[facility] LCSW Group Therapy 05/01/2017 1:15pm  Type of Therapy: Group Therapy- Feelings Around Relapse and Recovery  Participation Level: Pt invited. Did not attend. Pt is currently psychotic.     Donnelly StagerLynn Jona Erkkila, MSW, Theresia MajorsLCSWA [phone] 05/01/2017 4:41 PM

## 2017-05-01 NOTE — Progress Notes (Signed)
Recreation Therapy Notes  Date: 05/01/2017 Time: 9:30am Location: [address] Hall Dayroom  Group Topic: Stress Management  Goal Area(s) Addresses:  Patient will verbalize importance of using healthy stress management.  Patient will identify positive emotions associated with healthy stress management.   Intervention: Stress Management  Activity :  Guided Meditation. Recreation Therapy Intern introduced the stress management technique of guided meditation. Recreation Therapy Intern read a script that allowed patients to reach into their inner "chakra." Recreation Therapy Intern played calming meditation music. Patients were to follow along as script was read to engage in the activity.  Education: Stress Management, Discharge Planning.   Education Outcome: Needs additional education  Clinical Observations/Feedback: Pt did not attend group.  Anquinette Pierro, Recreation Therapy Intern  

## 2017-05-01 NOTE — H&P (Signed)
Psychiatric Admission Assessment Adult  Patient Identification: [name] MRN:  [id number] Date of Evaluation:  05/01/2017 Chief Complaint:  MDD SUBSTANCE ABUSE DISORDER Principal Diagnosis: MDD (major depressive disorder), recurrent episode, severe ([place]) Diagnosis:   Patient Active Problem List   Diagnosis Date Noted  . MDD (major depressive disorder), recurrent episode, severe ([place]) [F33.2] 04/30/2017  . HTN (hypertension) [I10] 04/01/2016  . Homeless single person [Z59.0] 04/01/2016  . Tobacco abuse disorder [Z72.0] 04/01/2016   History of Present Illness: [name] is an 56 y.o. male who presents voluntarily accompanied by reporting symptoms of depression and suicidal ideation with no plan or history of attempts.  Pt states that he has been on a downward spiral since his GF died 2 years ago, and he curently isolates himself from everyone and lives in the woods. Pt has a history of alcohol dependence, Schizophrenia/Schizoaffective Disorder and Bipolar per his chart. He currently denies AVH andparanoia, but appears guarded. He states that he drinks 2 24 oz beers daily (last drink last night), but says he can go "months" without drinking. He states that he does get the shakes when he doesn't drink. He states that he has been hospitalized in the past for mental health reasons, but he does not remember where or when other than "a long time ago".  Pt reports he tried at one point Abilify and Zoloft, but it did not work.  He states that he used to go to [facility], but has not had any treatment in a long time. "If I leave, I will just go back to the woods to my bottle of wine, and I am going downhill".  Pt acknowledges symptoms including: sleeplessness, loss of appetite. PT denies homicidal ideation/ history of violence.  Pt states current stressors include his living situation. Pt has no supports, and gets his mail form the post office. Pt denies history of abuse and trauma.  Pt denies family history of MH or SA issues. Pt has poor insight and judgment. Pt's memory appears impaired. Pt denies legal history. ?MSE: Pt is casually dressed with poor grooming, alert, oriented x4 with normal speech and normal motor behavior. Eye contact is fair. Pt's mood is depressed and affect is depressed and guarded. Affect is congruent with mood. Thought process is coherent and relevant. There is no indication Pt is currently responding to internal stimuli or experiencing delusional thought content. Pt was cooperative throughout assessment. Pt is currently unable to contract for safety outside the hospital and wants inpatient psychiatric treatment.  Patient appears with bizarre behavior and likes to throw his hands up in the air and state "I don't know." He admits some paranoia and feels that some one is continuously watching him. He keeps spinning around in the room. She does not appear to be responding to internal stimuli though. He reports living in the woods for 10 years + and feels like he is confined.  Associated Signs/Symptoms: Depression Symptoms:  depressed mood, hopelessness, recurrent thoughts of death, (Hypo) Manic Symptoms:  Distractibility, Irritable Mood, Anxiety Symptoms:  Social Anxiety, Psychotic Symptoms:  Paranoia, PTSD Symptoms: NA Total Time spent with patient: 45 minutes  Past Psychiatric History: MDD  Is the patient at risk to self? Yes.    Has the patient been a risk to self in the past 6 months? No.  Has the patient been a risk to self within the distant past? No.  Is the patient a risk to others? No.  Has the patient been a  risk to others in the past 6 months? No.  Has the patient been a risk to others within the distant past? No.   Prior Inpatient Therapy: Prior Inpatient Therapy: Yes Prior Therapy Dates:  (unknown) Prior Therapy Facilty/Provider(s):  (unknown) Reason for Treatment:  (depression) Prior Outpatient Therapy: Prior Outpatient Therapy:  Yes Prior Therapy Dates: at least 6 years ago Prior Therapy Facilty/Provider(s): [facility] Reason for Treatment: depressio, SA Does patient have an ACCT team?: No Does patient have Intensive In-House Services?  : No Does patient have [facility] services? : No Does patient have P4CC services?: No  Alcohol Screening: Patient refused Alcohol Screening Tool: Yes 1. How often do you have a drink containing alcohol?: Never 9. Have you or someone else been injured as a result of your drinking?: No 10. Has a relative or friend or a doctor or another health worker been concerned about your drinking or suggested you cut down?: No Alcohol Use Disorder Identification Test Final Score (AUDIT): 0 Brief Intervention: Yes Substance Abuse History in the last 12 months:  No. Consequences of Substance Abuse: NA Previous Psychotropic Medications: Yes  Psychological Evaluations: Yes  Past Medical History:  Past Medical History:  Diagnosis Date  . Alcoholic (HCC)   . Bipolar 1 disorder (HCC)   . Depression   . Diabetes mellitus without complication (HCC)   . Schizoaffective schizophrenia [facility])     Past Surgical History:  Procedure Laterality Date  . DENTAL SURGERY     wisdom teeth   Family History: History reviewed. No pertinent family history. Family Psychiatric  History: None reported Tobacco Screening: Have you used any form of tobacco in the last 30 days? (Cigarettes, Smokeless Tobacco, Cigars, and/or Pipes): Yes Tobacco use, Select all that apply: 5 or more cigarettes per day Are you interested in Tobacco Cessation Medications?: No, patient refused Counseled patient on smoking cessation including recognizing danger situations, developing coping skills and basic information about quitting provided: Yes Social History:  History  Alcohol Use  . Yes     History  Drug Use No    Additional Social History: Marital status: Single    Pain Medications: denies Prescriptions: denies Over  the Counter: denies History of alcohol / drug use?: Yes Longest period of sobriety (when/how long): "months" Negative Consequences of Use: Financial, Personal relationships Withdrawal Symptoms:  (none) Name of Substance 1: alcohol 1 - Age of First Use: 16 1 - Amount (size/oz): 48 oz of beer 1 - Frequency: daily 1 - Duration: ongoing 1 - Last Use / Amount: last night                  Allergies:  No Known Allergies Lab Results:  Results for orders placed or performed during the hospital encounter of 04/30/17 (from the past 48 hour(s))  CBC     Status: None   Collection Time: 04/30/17  6:33 PM  Result Value Ref Range   WBC 9.8 4.0 - 10.5 K/uL   RBC 4.39 4.22 - 5.81 MIL/uL   Hemoglobin 14.0 13.0 - 17.0 g/dL   HCT 11.2 16.2 - 44.6 %   MCV 94.5 78.0 - 100.0 fL   MCH 31.9 26.0 - 34.0 pg   MCHC 33.7 30.0 - 36.0 g/dL   RDW 95.0 72.2 - 57.5 %   Platelets 368 150 - 400 K/uL    Comment: Performed at [facility], [address], [place], [place] [address]  Comprehensive metabolic panel     Status: Abnormal  Collection Time: 04/30/17  6:33 PM  Result Value Ref Range   Sodium 138 135 - 145 mmol/L   Potassium 4.1 3.5 - 5.1 mmol/L   Chloride 103 101 - 111 mmol/L   CO2 27 22 - 32 mmol/L   Glucose, Bld 93 65 - 99 mg/dL   BUN 15 6 - 20 mg/dL   Creatinine, Ser 1.01 0.61 - 1.24 mg/dL   Calcium 9.0 8.9 - 10.3 mg/dL   Total Protein 7.1 6.5 - 8.1 g/dL   Albumin 4.2 3.5 - 5.0 g/dL   AST 16 15 - 41 U/L   ALT 10 (L) 17 - 63 U/L   Alkaline Phosphatase 76 38 - 126 U/L   Total Bilirubin 0.9 0.3 - 1.2 mg/dL   GFR calc non Af Amer >60 >60 mL/min   GFR calc Af Amer >60 >60 mL/min    Comment: (NOTE) The eGFR has been calculated using the CKD EPI equation. This calculation has not been validated in all clinical situations. eGFR's persistently <60 mL/min signify possible Chronic Kidney Disease.    Anion gap 8 5 - 15    Comment: Performed at [facility], [place] [address] [place], [place] [address]  Hemoglobin A1c     Status: None   Collection Time: 04/30/17  6:33 PM  Result Value Ref Range   Hgb A1c MFr Bld 5.3 4.8 - 5.6 %    Comment: (NOTE) Pre diabetes:          5.7%-6.4% Diabetes:              >6.4% Glycemic control for   <7.0% adults with diabetes    Mean Plasma Glucose 105.41 mg/dL    Comment: Performed at [facility] [address] [place], [place] [address]  Lipid panel     Status: Abnormal   Collection Time: 04/30/17  6:33 PM  Result Value Ref Range   Cholesterol 233 (H) 0 - 200 mg/dL   Triglycerides 224 (H) <150 mg/dL   HDL 46 >40 mg/dL   Total CHOL/HDL Ratio 5.1 RATIO   VLDL 45 (H) 0 - 40 mg/dL   LDL Cholesterol 142 (H) 0 - 99 mg/dL    Comment:        Total Cholesterol/HDL:CHD Risk Coronary Heart Disease Risk Table                     Men   Women  1/2 Average Risk   3.4   3.3  Average Risk       5.0   4.4  2 X Average Risk   9.6   7.1  3 X Average Risk  23.4   11.0        Use the calculated Patient Ratio above and the CHD Risk Table to determine the patient's CHD Risk.        ATP III CLASSIFICATION (LDL):  <100     mg/dL   Optimal  100-129  mg/dL   Near or Above                    Optimal  130-159  mg/dL   Borderline  160-189  mg/dL   High  >190     mg/dL   Very High Performed at [facility] [address] [place],  [address]   TSH     Status: None   Collection Time: 04/30/17  6:33 PM  Result Value Ref Range  TSH 3.164 0.350 - 4.500 uIU/mL    Comment: Performed by a 3rd Generation assay with a functional sensitivity of <=0.01 uIU/mL. Performed at [facility], [place] [address] [place], [place] [address]     Blood Alcohol level:  No results found for: [facility]  Metabolic Disorder Labs:  Lab Results  Component Value Date   HGBA1C 5.3 04/30/2017   MPG 105.41 04/30/2017   No results found for: PROLACTIN Lab Results  Component Value Date   CHOL 233  (H) 04/30/2017   TRIG 224 (H) 04/30/2017   HDL 46 04/30/2017   CHOLHDL 5.1 04/30/2017   VLDL 45 (H) 04/30/2017   LDLCALC 142 (H) 04/30/2017    Current Medications: Current Facility-Administered Medications  Medication Dose Route Frequency Provider Last Rate Last Dose  . risperiDONE (RISPERDAL) 0.5 MG tablet           . acetaminophen (TYLENOL) tablet 650 mg  650 mg Oral Q6H PRN Money, Lowry Ram, FNP   650 mg at 05/01/17 1130  . alum & mag hydroxide-simeth (MAALOX/MYLANTA) 200-200-20 MG/5ML suspension 30 mL  30 mL Oral Q4H PRN Money, Darnelle Maffucci B, FNP      . hydrocortisone cream 0.5 %   Topical TID Money, Lowry Ram, FNP      . hydrOXYzine (ATARAX/VISTARIL) tablet 25 mg  25 mg Oral Q6H PRN Money, Lowry Ram, FNP   25 mg at 05/01/17 1130  . ibuprofen (ADVIL,MOTRIN) tablet 600 mg  600 mg Oral Q8H PRN Money, Lowry Ram, FNP      . indomethacin (INDOCIN) capsule 50 mg  50 mg Oral TID PRN Money, Lowry Ram, FNP      . magnesium hydroxide (MILK OF MAGNESIA) suspension 30 mL  30 mL Oral Daily PRN Money, Lowry Ram, FNP      . risperiDONE (RISPERDAL) tablet 0.5 mg  0.5 mg Oral QHS Money, Lowry Ram, FNP      . risperiDONE (RISPERDAL) tablet 0.5 mg  0.5 mg Oral Once Money, Lowry Ram, FNP      . traZODone (DESYREL) tablet 50 mg  50 mg Oral QHS PRN Money, Lowry Ram, FNP       PTA Medications: Facility-Administered Medications Prior to Admission  Medication Dose Route Frequency Provider Last Rate Last Dose  . pneumococcal 23 valent vaccine (PNU-IMMUNE) injection 0.5 mL  0.5 mL Intramuscular Once Maren Reamer, MD       Prescriptions Prior to Admission  Medication Sig Dispense Refill Last Dose  . ibuprofen (ADVIL,MOTRIN) 200 MG tablet Take 600-800 mg by mouth every 6 (six) hours as needed (For headache, pain or gout.).   04/30/2017  . indomethacin (INDOCIN) 50 MG capsule Take 50 mg by mouth 3 (three) times daily as needed (For gout.).   Past Week  . loratadine (CLARITIN) 10 MG tablet Take 1 tablet (10 mg total) by  mouth daily. (Patient taking differently: Take 10 mg by mouth daily as needed for allergies. ) 30 tablet 02 March 2017    Musculoskeletal: Strength & Muscle Tone: within normal limits Gait & Station: normal Patient leans: N/A  Psychiatric Specialty Exam: Physical Exam  Nursing note and vitals reviewed. Constitutional: He is oriented to person, place, and time. He appears well-developed and well-nourished.  Cardiovascular: Normal rate.   Respiratory: Effort normal.  Musculoskeletal: Normal range of motion.  Neurological: He is alert and oriented to person, place, and time.    Review of Systems  Constitutional: Negative.   HENT: Negative.   Eyes: Negative.  Respiratory: Negative.   Cardiovascular: Negative.   Gastrointestinal: Negative.   Genitourinary: Negative.   Musculoskeletal: Negative.   Skin: Positive for itching and rash.  Neurological: Negative.   Endo/Heme/Allergies: Negative.     Blood pressure 135/88, pulse 74, temperature 98.1 F (36.7 C), temperature source Oral, resp. rate 16, height 6' (1.829 m), weight 67.1 kg (148 lb).Body mass index is 20.07 kg/m.  General Appearance: Disheveled  Eye Contact:  Good  Speech:  Clear and Coherent and Normal Rate  Volume:  Normal  Mood:  Anxious  Affect:  Flat  Thought Process:  Disorganized  Orientation:  Full (Time, Place, and Person)  Thought Content:  Paranoid Ideation and Rumination  Suicidal Thoughts:  He states "I'm not sure."  Homicidal Thoughts:  No  Memory:  Immediate;   Good Recent;   Good  Judgement:  Fair  Insight:  Lacking  Psychomotor Activity:  Normal  Concentration:  Concentration: Fair  Recall:  Good  Fund of Knowledge:  Fair  Language:  Good  Akathisia:  No  Handed:  Right  AIMS (if indicated):     Assets:  Desire for Improvement  ADL's:  Intact  Cognition:  WNL  Sleep:  Number of Hours: 6.75    Treatment Plan Summary: Daily contact with patient to assess and evaluate symptoms and progress  in treatment, Medication management and Plan is to: -Start Risperidone 0.5 mg PO QHS for mood stability -Continue Hydroxyzine 25 mg PO Q6H PRN for anxiety -Continue Trazodone 50 mg PO QHS PRN for insomnia -Encourage group therapy participation   Observation Level/Precautions:  15 minute checks  Laboratory:  CBC Chemistry Profile UDS UA  Psychotherapy:  Group therapy  Medications:  See MAR  Consultations:  As needed  Discharge Concerns:  Compliance  Estimated LOS: 3-5 days  Other:  Admit to 300 hall   Physician Treatment Plan for Primary Diagnosis: MDD (major depressive disorder), recurrent episode, severe ([place]) Long Term Goal(s): Improvement in symptoms so as ready for discharge  Short Term Goals: Ability to verbalize feelings will improve  Physician Treatment Plan for Secondary Diagnosis: Principal Problem:   MDD (major depressive disorder), recurrent episode, severe ([place])  Long Term Goal(s): Improvement in symptoms so as ready for discharge  Short Term Goals: Ability to demonstrate self-control will improve and Compliance with prescribed medications will improve  I certify that inpatient services furnished can reasonably be expected to improve the patient's condition.    Lewis Shock, FNP 8/10/201811:38 AM   I have reviewed case with NP and have met with patient Agree with NP assessment  Patient presents as  poor historian, guarded,  irritable, answering only some questions,  states " I don't want to answer stupid questions I have already answered many times ".  Most information obtained from chart - no current source of collateral information  56 year old male , lives alone, no children, homeless, reports he lives " in the woods". On disability. Patient reported  feeling progressively depressed , developed suicidal ideations ( no specific plan endorsed)  , went to  ED voluntarily . Reported death of  He reported intermittent alcohol consumption- 2 24 ounce beers per  day. Stated he has periods of time where he does not drink, but did report " getting the shakes " when he stops drinking  ( No BAL or UDS on admission)   Chart notes indicate history of Schizophrenia/Shcizoaffective Disorder, and Alcohol Use Disorder. States he was not taking any psychiatric medications prior to  admission,  Chart notes indicate he was tried on Abilify and Zoloft in the past , but that they did not work. Patient does not endorse prior  history of suicidal attempts.  He denies medical illnesses   Dx- Schizoaffective Disorder by history, Alcohol Use Disorder  Plan- inpatient admission. Started on Risperidone 1 mgrs QHS(  low dose initially, titrate gradually as tolerated) Will  start Remeron 7.5 mgrs QHS for depression, insomnia .  Will start Librium detox protocol ( PRN) to minimize risk of withdrawal.

## 2017-05-01 NOTE — [facility] Counselor (Signed)
CSW attempted to complete assessment with pt. Pt seems to be experiencing thought blocking and also presents as very guarded. When CSW sat down with pt to try to complete the assessment, pt stated "Read my file. It's in there". Pt stated this repeatedly and was agitated. Pt states that he doesn't feel comfortable answering the questions. CSW asked pt if he would like this writer to return at another time and pt agreed. CSW will try to complete PSA later when pt is less symptomatic and more appropriate.   Jonathon JordanLynn B Metha Kolasa, MSW, Theresia MajorsLCSWA [phone]

## 2017-05-02 ENCOUNTER — Encounter (HOSPITAL_COMMUNITY): Payer: Self-pay | Admitting: Behavioral Health

## 2017-05-02 DIAGNOSIS — F332 Major depressive disorder, recurrent severe without psychotic features: Principal | ICD-10-CM

## 2017-05-02 DIAGNOSIS — F1910 Other psychoactive substance abuse, uncomplicated: Secondary | ICD-10-CM

## 2017-05-02 NOTE — [facility] Counselor (Signed)
Clinical Social Work Note  Two attempts made today, at 9:00am and 3:30pm, to do Psychosocial Assessment with patient.  Both times he was irritable and refused.  CSW to attempt again tomorrow.  Ambrose MantleMareida Grossman-Orr, LCSW 05/02/2017, 3:55 PM

## 2017-05-02 NOTE — [facility] Group Notes (Signed)
[facility] LCSW Group Therapy Note  04/25/2017 10:15 to 11:15 AM  Type of Therapy and Topic:  Group Therapy: Avoiding Self-Sabotaging and Enabling Behaviors  Participation Level:  Did Not Attend; invited to participate yet did not despite overhead announcement and encouragement by CSW who went into patient's room before group began.  Description of Group The main focus of today's process group to discuss what "self-sabotage" means and use motivational iInterviewing to discuss what benefits, negative or positive, were involved in a self-identified self-sabotaging behavior. We then talked about reasons the patient may want to change the behavior and their current desire to change.   Therapeutic molalities: Cognitive Behavioral Therapy Person-Centered Therapy Motivational Interviewing  Therapeutic Goals: 1. Patients will demonstrate understanding of the concept of self sabotage 2. Patients will be able to identify pros and cons of their behaviors 3. Patients will be able to identify at least two motivating factors for l of their desire for change   Catherine C Harrill, LCSW  

## 2017-05-02 NOTE — Progress Notes (Signed)
Pt did not attend wrap-up group   

## 2017-05-02 NOTE — Progress Notes (Signed)
[facility] MD Progress Note  05/02/2017 10:30 AM [name]  MRN:  [id number]  Subjective:  " You know my reason for being here. Its in my chart so why are you asking. I don't want to have this conversation with you or anyone anymore."  Objective: Face to face evaluation completed and chart reviewed. During thi evaluation patient is very irritable and guarded. He answered questions regarding SI which he denied and he denied any AVH or HI. He endorsed poor sleeping pattern and appetite as fair. Writer asked patient about any withdrawal symptoms and suddenly patient stated, " UI am not doing this anymore." Social worker attempted to speak with patient however patient stated to Education officer, museum that he did not want to speak with her as well. Patient then stormed off without return. As per nursing,He is very irritable and is refusing medications and anything to drink    bp is low   He has isolated to his room and has no eye contact or interaction. Patient seems bizarre at times. Patient did not provide any further information.   Principal Problem: MDD (major depressive disorder), recurrent episode, severe ([place]) Diagnosis:   Patient Active Problem List   Diagnosis Date Noted  . MDD (major depressive disorder), recurrent episode, severe ([place]) [F33.2] 04/30/2017  . HTN (hypertension) [I10] 04/01/2016  . Homeless single person [Z59.0] 04/01/2016  . Tobacco abuse disorder [Z72.0] 04/01/2016   Total Time spent with patient: 30 minutes  Past Psychiatric History: MDD Past Medical History:  Past Medical History:  Diagnosis Date  . Alcoholic ([place])   . Bipolar 1 disorder ([place])   . Depression   . Diabetes mellitus without complication ([place])   . Schizoaffective schizophrenia [facility])     Past Surgical History:  Procedure Laterality Date  . DENTAL SURGERY     wisdom teeth   Family History: History reviewed. No pertinent family history. Family Psychiatric  History: None reported Social History:   History  Alcohol Use  . Yes     History  Drug Use No    Social History   Social History  . Marital status: Single    Spouse name: N/A  . Number of children: N/A  . Years of education: N/A   Social History Main Topics  . Smoking status: Current Every Day Smoker    Packs/day: 2.00    Types: Cigarettes  . Smokeless tobacco: Never Used  . Alcohol use Yes  . Drug use: No  . Sexual activity: Not Asked   Other Topics Concern  . None   Social History Narrative  . None   Additional Social History:    Pain Medications: denies Prescriptions: denies Over the Counter: denies History of alcohol / drug use?: Yes Longest period of sobriety (when/how long): "months" Negative Consequences of Use: Financial, Personal relationships Withdrawal Symptoms:  (none) Name of Substance 1: alcohol 1 - Age of First Use: 16 1 - Amount (size/oz): 48 oz of beer 1 - Frequency: daily 1 - Duration: ongoing 1 - Last Use / Amount: last night                  Sleep: Poor  Appetite:  Fair  Current Medications: Current Facility-Administered Medications  Medication Dose Route Frequency Provider Last Rate Last Dose  . acetaminophen (TYLENOL) tablet 650 mg  650 mg Oral Q6H PRN Money, Lowry Ram, FNP   650 mg at 05/02/17 0754  . alum & mag hydroxide-simeth (MAALOX/MYLANTA) 200-200-20 MG/5ML suspension 30 mL  30  mL Oral Q4H PRN Money, Lowry Ram, FNP      . chlordiazePOXIDE (LIBRIUM) capsule 25 mg  25 mg Oral Q6H PRN Cobos, Myer Peer, MD   25 mg at 05/02/17 3614  . hydrocortisone cream 0.5 %   Topical TID Money, Lowry Ram, FNP      . hydrOXYzine (ATARAX/VISTARIL) tablet 25 mg  25 mg Oral Q6H PRN Cobos, Fernando A, MD      . ibuprofen (ADVIL,MOTRIN) tablet 600 mg  600 mg Oral Q8H PRN Money, Lowry Ram, FNP   600 mg at 05/02/17 4315  . indomethacin (INDOCIN) capsule 50 mg  50 mg Oral TID PRN Money, Lowry Ram, FNP      . loperamide (IMODIUM) capsule 2-4 mg  2-4 mg Oral PRN Cobos, Myer Peer, MD      .  magnesium hydroxide (MILK OF MAGNESIA) suspension 30 mL  30 mL Oral Daily PRN Money, Lowry Ram, FNP      . mirtazapine (REMERON) tablet 7.5 mg  7.5 mg Oral QHS Cobos, Myer Peer, MD      . multivitamin with minerals tablet 1 tablet  1 tablet Oral Daily Cobos, Myer Peer, MD      . ondansetron (ZOFRAN-ODT) disintegrating tablet 4 mg  4 mg Oral Q6H PRN Cobos, Myer Peer, MD      . risperiDONE (RISPERDAL) tablet 1 mg  1 mg Oral QHS Cobos, Myer Peer, MD      . thiamine (B-1) injection 100 mg  100 mg Intramuscular Once Cobos, Myer Peer, MD      . thiamine (VITAMIN B-1) tablet 100 mg  100 mg Oral Daily Cobos, Myer Peer, MD        Lab Results:  Results for orders placed or performed during the hospital encounter of 04/30/17 (from the past 48 hour(s))  CBC     Status: None   Collection Time: 04/30/17  6:33 PM  Result Value Ref Range   WBC 9.8 4.0 - 10.5 K/uL   RBC 4.39 4.22 - 5.81 MIL/uL   Hemoglobin 14.0 13.0 - 17.0 g/dL   HCT 41.5 39.0 - 52.0 %   MCV 94.5 78.0 - 100.0 fL   MCH 31.9 26.0 - 34.0 pg   MCHC 33.7 30.0 - 36.0 g/dL   RDW 14.3 11.5 - 15.5 %   Platelets 368 150 - 400 K/uL    Comment: Performed at [facility], [place] [address] [place], [place] [address]  Comprehensive metabolic panel     Status: Abnormal   Collection Time: 04/30/17  6:33 PM  Result Value Ref Range   Sodium 138 135 - 145 mmol/L   Potassium 4.1 3.5 - 5.1 mmol/L   Chloride 103 101 - 111 mmol/L   CO2 27 22 - 32 mmol/L   Glucose, Bld 93 65 - 99 mg/dL   BUN 15 6 - 20 mg/dL   Creatinine, Ser 1.01 0.61 - 1.24 mg/dL   Calcium 9.0 8.9 - 10.3 mg/dL   Total Protein 7.1 6.5 - 8.1 g/dL   Albumin 4.2 3.5 - 5.0 g/dL   AST 16 15 - 41 U/L   ALT 10 (L) 17 - 63 U/L   Alkaline Phosphatase 76 38 - 126 U/L   Total Bilirubin 0.9 0.3 - 1.2 mg/dL   GFR calc non Af Amer >60 >60 mL/min   GFR calc Af Amer >60 >60 mL/min    Comment: (NOTE) The eGFR has been calculated using the CKD EPI equation. This calculation  has not been validated in all clinical situations. eGFR's persistently <60 mL/min signify possible Chronic Kidney Disease.    Anion gap 8 5 - 15    Comment: Performed at [facility], [address], [place], [place] [address]  Hemoglobin A1c     Status: None   Collection Time: 04/30/17  6:33 PM  Result Value Ref Range   Hgb A1c MFr Bld 5.3 4.8 - 5.6 %    Comment: (NOTE) Pre diabetes:          5.7%-6.4% Diabetes:              >6.4% Glycemic control for   <7.0% adults with diabetes    Mean Plasma Glucose 105.41 mg/dL    Comment: Performed at [facility], [address] [place], [place] [address]  Lipid panel     Status: Abnormal   Collection Time: 04/30/17  6:33 PM  Result Value Ref Range   Cholesterol 233 (H) 0 - 200 mg/dL   Triglycerides 204 (H) <150 mg/dL   HDL 46 >14 mg/dL   Total CHOL/HDL Ratio 5.1 RATIO   VLDL 45 (H) 0 - 40 mg/dL   LDL Cholesterol 766 (H) 0 - 99 mg/dL    Comment:        Total Cholesterol/HDL:CHD Risk Coronary Heart Disease Risk Table                     Men   Women  1/2 Average Risk   3.4   3.3  Average Risk       5.0   4.4  2 X Average Risk   9.6   7.1  3 X Average Risk  23.4   11.0        Use the calculated Patient Ratio above and the CHD Risk Table to determine the patient's CHD Risk.        ATP III CLASSIFICATION (LDL):  <100     mg/dL   Optimal  914-260  mg/dL   Near or Above                    Optimal  130-159  mg/dL   Borderline  723-007  mg/dL   High  >293     mg/dL   Very High Performed at [facility], [address], [place], [place] [address]   TSH     Status: None   Collection Time: 04/30/17  6:33 PM  Result Value Ref Range   TSH 3.164 0.350 - 4.500 uIU/mL    Comment: Performed by a 3rd Generation assay with a functional sensitivity of <=0.01 uIU/mL. Performed at [facility], [address], [place], [place] [address]     Blood Alcohol level:  No results found for:  [facility]  Metabolic Disorder Labs: Lab Results  Component Value Date   HGBA1C 5.3 04/30/2017   MPG 105.41 04/30/2017   No results found for: PROLACTIN Lab Results  Component Value Date   CHOL 233 (H) 04/30/2017   TRIG 224 (H) 04/30/2017   HDL 46 04/30/2017   CHOLHDL 5.1 04/30/2017   VLDL 45 (H) 04/30/2017   LDLCALC 142 (H) 04/30/2017    Physical Findings: AIMS: Facial and Oral Movements Muscles of Facial Expression: None, normal Lips and Perioral Area: None, normal Jaw: None, normal Tongue: None, normal,Extremity Movements Upper (arms, wrists, hands, fingers): None, normal Lower (legs, knees, ankles, toes): None, normal, Trunk Movements Neck, shoulders, hips:  None, normal, Overall Severity Severity of abnormal movements (highest score from questions above): None, normal Incapacitation due to abnormal movements: None, normal Patient's awareness of abnormal movements (rate only patient's report): No Awareness, Dental Status Current problems with teeth and/or dentures?: Yes Does patient usually wear dentures?: No  CIWA:  CIWA-Ar Total: 10 COWS:     Musculoskeletal: Strength & Muscle Tone: within normal limits Gait & Station: normal Patient leans: N/A  Psychiatric Specialty Exam: Physical Exam  Nursing note and vitals reviewed. Constitutional: He is oriented to person, place, and time.  Neurological: He is alert and oriented to person, place, and time.    Review of Systems  Psychiatric/Behavioral: Positive for depression and substance abuse. Negative for hallucinations and memory loss. The patient is nervous/anxious and has insomnia.   All other systems reviewed and are negative.   Blood pressure (!) 65/37, pulse 71, temperature 97.6 F (36.4 C), temperature source Oral, resp. rate 16, height 6' (1.829 m), weight 148 lb (67.1 kg), SpO2 99 %.Body mass index is 20.07 kg/m.  General Appearance: Guarded  Eye Contact:  Fair  Speech:  Clear and Coherent and Normal Rate   Volume:  Normal  Mood:  Angry and Irritable  Affect:  Congruent and Restricted  Thought Process:  Coherent, Linear and Descriptions of Associations: Intact  Orientation:  Full (Time, Place, and Person)  Thought Content:  Paranoid Ideation and Rumination  Suicidal Thoughts:  No  Homicidal Thoughts:  No  Memory:  Immediate;   Fair Recent;   Fair  Judgement:  Fair  Insight:  Lacking  Psychomotor Activity:  Normal  Concentration:  Concentration: Fair and Attention Span: Fair  Recall:  [employer] of Knowledge:  Fair  Language:  Good  Akathisia:  Negative  Handed:  Right  AIMS (if indicated):     Assets:  Desire for Improvement Resilience  ADL's:  Intact  Cognition:  WNL  Sleep:  Number of Hours: 6.75     Treatment Plan Summary: Daily contact with patient to assess and evaluate symptoms and progress in treatment    Medication management: Psychiatric conditions are unstable at this time. To reduce current symptoms to base line and improve the patient's overall level of functioning will continue the following plan without adjustments;   -Conitnue Risperidone 0.5 mg PO QHS for mood stability -Continue Hydroxyzine 25 mg PO Q6H PRN for anxiety -Continue Trazodone 50 mg PO QHS PRN for insomnia   Other:  Safety: Will conitnue15 minute observation for safety checks. Patient is able to contract for safety on the unit at this time  Continue to develop treatment plan to decrease risk of relapse upon discharge and to reduce the need for readmission.  Psycho-social education regarding relapse prevention and self care.  Health care follow up as needed for medical problems.  Continue to attend and participate in therapy.     Mordecai Maes, NP 05/02/2017, 10:30 AM

## 2017-05-02 NOTE — Progress Notes (Signed)
Data. Patient denies SI/HI/AVH. Patient interacting minimaly with staff and other patients, though he has spent more time in the day room with his peers, this shift. Does not initiate interaction. C/O his back hurting this AM, "That bed has slats and it hurts my back. ia ma going to take the mattress off the bed and put it on the floor." patient was offered a bed with no slats and patient agreed. Patient moved rooms R/T this. Affect is flat/blunt and , irritableand does not brighten. Patient refuses eye contact most of the time. When he does make eye contact, it is very intense. Minimal words used in responses to questions. Denies any detox symptoms. Action. Emotional support and encouragement offered. Education provided on medication, indications and side effect. Q 15 minute checks done for safety. Response. Safety on the unit maintained through 15 minute checks.  Medications taken as prescribed. Attended groups. Remained calm and appropriate through out shift.

## 2017-05-02 NOTE — [facility] Group Notes (Signed)
[facility] Group Notes:  (Nursing/MHT/Case Management/Adjunct)  Date:  05/02/2017  Time:  6:58 PM  Type of Therapy:  Nurse Education  Summary of Progress/Problems:   Group was an activity of discussing various topics on the therapy ball.   Almira Barenny G Charley Lafrance 05/02/2017, 6:58 PM

## 2017-05-03 DIAGNOSIS — I10 Essential (primary) hypertension: Secondary | ICD-10-CM

## 2017-05-03 DIAGNOSIS — R413 Other amnesia: Secondary | ICD-10-CM

## 2017-05-03 LAB — TSH: TSH: 4.147 u[IU]/mL (ref 0.350–4.500)

## 2017-05-03 MED ORDER — NICOTINE 21 MG/24HR TD PT24
21.0000 mg | MEDICATED_PATCH | Freq: Every day | TRANSDERMAL | Status: DC
  Administered 2017-05-03 – 2017-05-06 (×4): 21 mg via TRANSDERMAL
  Filled 2017-05-03 (×5): qty 1

## 2017-05-03 MED ORDER — NICOTINE 21 MG/24HR TD PT24: 21.0000 mg | MEDICATED_PATCH | Freq: Every day | TRANSDERMAL | Status: DC

## 2017-05-03 MED ORDER — NICOTINE 21 MG/24HR TD PT24
MEDICATED_PATCH | TRANSDERMAL | Status: AC
  Filled 2017-05-03: qty 1

## 2017-05-03 NOTE — Progress Notes (Signed)
Writer has had patient all weekend and patient continues to ignore questions asked or even if writer attempts to have a brief conversation with him he will look at me and turn back to watching tv.  He has not taken any medications scheduled but will request pain medication for his back. Patient is safe on unit with 15 min checks.

## 2017-05-03 NOTE — [facility] Group Notes (Signed)
Adult Psychoeducational Group Note  Date:  05/03/2017 Time:  2:45 PM  Group Topic/Focus: Relaxation Group   Participation Level:  Did not partisipate  Participation Quality:  poor Affect:  flat  Cognitive:  limited  Insight: Lacking  Engagement in Group:  Lackig  Modes of Intervention:  Reframed questions   Additional Comments:Pt was looking out the window the whole of group. When asked to close his eyes to do a progressive relaxation Pt would not do it. Dione HousekeeperJudge, Gonzalo Waymire A 05/03/2017, 2:45 PM

## 2017-05-03 NOTE — [facility] Group Notes (Signed)
[facility] LCSW Group Therapy Note   05/03/2017  At 10:15 to 11:10 AM   Type of Therapy and Topic: Group Therapy: Feelings Around Returning Home & Establishing a Supportive Framework and Developing a Healthier Routine  Participation Level: Did Not Attend   Description of Group:  Patients first processed thoughts and feelings about up coming discharge. These included fears of upcoming changes, lack of change, new living environments, judgements and expectations from others and overall stigma of MH issues. We then discussed what is a supportive framework? What does it look like feel like and how do I discern it from and unhealthy non-supportive network? Learn how to cope when supports are not helpful and don't support you. Discuss what to do when your family/friends are not supportive.   Therapeutic Goals Addressed in Processing Group:  1. Patient will identify one healthy supportive network that they can use at discharge. 2. Patient will identify one factor of a supportive framework and how to tell it from an unhealthy network. 3. Patient able to identify one coping skill to use when they do not have positive supports from others. 4. Patient will demonstrate ability to communicate their needs through discussion and/or role plays.  Summary of Patient Progress:  Pt did not attend despite overhead announcement and encouragement by staff   Mahima Hottle C Crew Goren, LCSW   

## 2017-05-03 NOTE — [facility] Counselor (Signed)
Adult Comprehensive Assessment  Patient ID: TAIM WURM, male   DOB: 1960/12/14, 56 y.o.   MRN: [id number]  Information Source: Information source: Patient  Current Stressors:  Educational / Learning stressors: Denies stressors Employment / Job issues: Denies stressors Family Relationships: Denies Chief Technology Officer / Lack of resources (include bankruptcy): Denies stressors Housing / Lack of housing: Denies stressors  - States he has no housing, but it is not stressful. Physical health (include injuries & life threatening diseases): Denies stressors Social relationships: Denies stressors - states he has none, but it is not stressful Substance abuse: Denies stressors Bereavement / Loss: Denies stressors - girlfriend died 2 years ago and he states this is why he is in the hospital but "the answer is no."  Living/Environment/Situation:  Living Arrangements: Alone Living conditions (as described by patient or guardian): In the woods, in a tent How long has patient lived in current situation?: 12 years What is atmosphere in current home: Other (Comment) ("Outside")  Family History:  Marital status: Single Does patient have children?: No  Childhood History:  By whom was/is the patient raised?: Both parents Description of patient's relationship with caregiver when they were a child: Normal with both parents Patient's description of current relationship with people who raised him/her: Parents are both deceased. How were you disciplined when you got in trouble as a child/adolescent?: Did not get discipline. Does patient have siblings?: No Did patient suffer any verbal/emotional/physical/sexual abuse as a child?: No Did patient suffer from severe childhood neglect?: No Has patient ever been sexually abused/assaulted/raped as an adolescent or adult?: No Was the patient ever a victim of a crime or a disaster?: No Witnessed domestic violence?: No Has patient been effected by domestic  violence as an adult?: No  Education:  Highest grade of school patient has completed: Graduated high school Currently a student?: No Learning disability?: No  Employment/Work Situation:   Employment situation: Unemployed What is the longest time patient has a held a job?: Unknown Where was the patient employed at that time?: "I don't remember." Has patient ever been in the [employer]?: No Are There Guns or Other Weapons in Your Home?: No  Financial Resources:   Financial resources: No income, Medicaid Does patient have a Lawyer or guardian?: No  Alcohol/Substance Abuse:   What has been your use of drugs/alcohol within the last 12 months?: Alcohol Alcohol/Substance Abuse Treatment Hx: Denies past history Has alcohol/substance abuse ever caused legal problems?: No  Social Support System:   Forensic psychologist System: None Describe Community Support System: N/A Type of faith/religion: None How does patient's faith help to cope with current illness?: N/A  Leisure/Recreation:   Leisure and Hobbies: Will not answer  Strengths/Needs:   What things does the patient do well?: Will not answer In what areas does patient struggle / problems for patient: "None"  Discharge Plan:   Does patient have access to transportation?: Yes ("I will walk.") Will patient be returning to same living situation after discharge?: Yes Currently receiving community mental health services: No If no, would patient like referral for services when discharged?: No Does patient have financial barriers related to discharge medications?: No (No income, no insurance per pt, but record reflects he has Medicaid)  Summary/Recommendations:   Summary and Recommendations (to be completed by the evaluator): Patient is a 56yo male admitted with suicidal ideation and substance abuse, with a stated history of Schizophrenia  Primary stressors include girlfriend's death 2 years ago, isolation, living in  the woods,  lack of support.  Patient will benefit from crisis stabilization, medication evaluation, group therapy and psychoeducation, in addition to case management for discharge planning. At discharge it is recommended that Patient adhere to the established discharge plan and continue in treatment.  Lynnell ChadMareida J Grossman-Orr. 05/03/2017

## 2017-05-03 NOTE — Progress Notes (Signed)
[facility] MD Progress Note  05/03/2017 1:12 PM JHORDAN MCKIBBEN  MRN:  [id number]   Subjective:  Patient reports that he has been slightly irritated today, but has not taken any medication for it. He then states that no one told him he has any medication to take other than Tylenol or Ibuprofen. He states he does not remember taking any medications last night and it has never been discussed with him.   Objective: Patient appears irritable and is asked to take a Vistaril which he does. However, patient's memory is very poor. MMSE was 19 of 30. CT scan in 2017 showed no abnormalities. Blood work was added for today to include TSH, B12, Folate, and RPR. Patient seems to get more agitated when asked questions and may be due to memory loss. So know I am unsure of the history he reported on admission.   Principal Problem: MDD (major depressive disorder), recurrent episode, severe (HCC) Diagnosis:   Patient Active Problem List   Diagnosis Date Noted  . MDD (major depressive disorder), recurrent episode, severe (HCC) [F33.2] 04/30/2017  . HTN (hypertension) [I10] 04/01/2016  . Homeless single person [Z59.0] 04/01/2016  . Tobacco abuse disorder [Z72.0] 04/01/2016   Total Time spent with patient: 25 minutes  Past Psychiatric History: See H&P  Past Medical History:  Past Medical History:  Diagnosis Date  . Alcoholic (HCC)   . Bipolar 1 disorder (HCC)   . Depression   . Diabetes mellitus without complication (HCC)   . Schizoaffective schizophrenia [facility])     Past Surgical History:  Procedure Laterality Date  . DENTAL SURGERY     wisdom teeth   Family History: History reviewed. No pertinent family history. Family Psychiatric  History: See H&P Social History:  History  Alcohol Use  . Yes     History  Drug Use No    Social History   Social History  . Marital status: Single    Spouse name: N/A  . Number of children: N/A  . Years of education: N/A   Social History Main Topics  .  Smoking status: Current Every Day Smoker    Packs/day: 2.00    Types: Cigarettes  . Smokeless tobacco: Never Used  . Alcohol use Yes  . Drug use: No  . Sexual activity: Not Asked   Other Topics Concern  . None   Social History Narrative  . None   Additional Social History:    Pain Medications: denies Prescriptions: denies Over the Counter: denies History of alcohol / drug use?: Yes Longest period of sobriety (when/how long): "months" Negative Consequences of Use: Financial, Personal relationships Withdrawal Symptoms:  (none) Name of Substance 1: alcohol 1 - Age of First Use: 16 1 - Amount (size/oz): 48 oz of beer 1 - Frequency: daily 1 - Duration: ongoing 1 - Last Use / Amount: last night                  Sleep: Good  Appetite:  Good  Current Medications: Current Facility-Administered Medications  Medication Dose Route Frequency Provider Last Rate Last Dose  . acetaminophen (TYLENOL) tablet 650 mg  650 mg Oral Q6H PRN Braydn Carneiro, Gerlene Burdock, FNP   650 mg at 05/02/17 0754  . alum & mag hydroxide-simeth (MAALOX/MYLANTA) 200-200-20 MG/5ML suspension 30 mL  30 mL Oral Q4H PRN Emberlie Gotcher, Gerlene Burdock, FNP      . chlordiazePOXIDE (LIBRIUM) capsule 25 mg  25 mg Oral Q6H PRN Cobos, Rockey Situ, MD   25 mg at  05/02/17 1610  . hydrocortisone cream 0.5 %   Topical TID Awab Abebe, Gerlene Burdock, FNP      . hydrOXYzine (ATARAX/VISTARIL) tablet 25 mg  25 mg Oral Q6H PRN Cobos, Rockey Situ, MD   25 mg at 05/03/17 1103  . ibuprofen (ADVIL,MOTRIN) tablet 600 mg  600 mg Oral Q8H PRN Cason Dabney, Gerlene Burdock, FNP   600 mg at 05/03/17 0758  . indomethacin (INDOCIN) capsule 50 mg  50 mg Oral TID PRN Carlyne Keehan, Gerlene Burdock, FNP      . loperamide (IMODIUM) capsule 2-4 mg  2-4 mg Oral PRN Cobos, Rockey Situ, MD      . magnesium hydroxide (MILK OF MAGNESIA) suspension 30 mL  30 mL Oral Daily PRN Jovante Hammitt, Feliz Beam B, FNP      . mirtazapine (REMERON) tablet 7.5 mg  7.5 mg Oral QHS Cobos, Rockey Situ, MD      . multivitamin with minerals  tablet 1 tablet  1 tablet Oral Daily Cobos, Rockey Situ, MD   1 tablet at 05/03/17 0758  . nicotine (NICODERM CQ - dosed in mg/24 hours) patch 21 mg  21 mg Transdermal Q0600 Cobos, Rockey Situ, MD   21 mg at 05/03/17 0833  . ondansetron (ZOFRAN-ODT) disintegrating tablet 4 mg  4 mg Oral Q6H PRN Cobos, Rockey Situ, MD      . risperiDONE (RISPERDAL) tablet 1 mg  1 mg Oral QHS Cobos, Fernando A, MD      . thiamine (B-1) injection 100 mg  100 mg Intramuscular Once Cobos, Fernando A, MD      . thiamine (VITAMIN B-1) tablet 100 mg  100 mg Oral Daily Cobos, Rockey Situ, MD   100 mg at 05/03/17 9604    Lab Results: No results found for this or any previous visit (from the past 48 hour(s)).  Blood Alcohol level:  No results found for: [facility]  Metabolic Disorder Labs: Lab Results  Component Value Date   HGBA1C 5.3 04/30/2017   MPG 105.41 04/30/2017   No results found for: PROLACTIN Lab Results  Component Value Date   CHOL 233 (H) 04/30/2017   TRIG 224 (H) 04/30/2017   HDL 46 04/30/2017   CHOLHDL 5.1 04/30/2017   VLDL 45 (H) 04/30/2017   LDLCALC 142 (H) 04/30/2017    Physical Findings: AIMS: Facial and Oral Movements Muscles of Facial Expression: None, normal Lips and Perioral Area: None, normal Jaw: None, normal Tongue: None, normal,Extremity Movements Upper (arms, wrists, hands, fingers): None, normal Lower (legs, knees, ankles, toes): None, normal, Trunk Movements Neck, shoulders, hips: None, normal, Overall Severity Severity of abnormal movements (highest score from questions above): None, normal Incapacitation due to abnormal movements: None, normal Patient's awareness of abnormal movements (rate only patient's report): No Awareness, Dental Status Current problems with teeth and/or dentures?: No Does patient usually wear dentures?: No  CIWA:  CIWA-Ar Total: 0 COWS:     Musculoskeletal: Strength & Muscle Tone: within normal limits Gait & Station: normal Patient leans:  N/A  Psychiatric Specialty Exam: Physical Exam  Nursing note and vitals reviewed. Constitutional: He is oriented to person, place, and time. He appears well-developed.  Cardiovascular: Normal rate.   Respiratory: Effort normal.  Musculoskeletal: Normal range of motion.  Neurological: He is alert and oriented to person, place, and time.    Review of Systems  Constitutional: Negative.   HENT: Negative.   Eyes: Negative.   Respiratory: Negative.   Cardiovascular: Negative.   Gastrointestinal: Negative.   Genitourinary: Negative.   Musculoskeletal: Negative.  Skin: Negative.   Neurological: Negative.   Endo/Heme/Allergies: Negative.     Blood pressure (!) 76/59, pulse 92, temperature 97.9 F (36.6 C), temperature source Oral, resp. rate 16, height 6' (1.829 m), weight 67.1 kg (148 lb), SpO2 99 %.Body mass index is 20.07 kg/m.  General Appearance: Bizarre  Eye Contact:  Good  Speech:  Pressured  Volume:  Normal  Mood:  Anxious and Irritable  Affect:  Flat  Thought Process:  Disorganized and Descriptions of Associations: Loose  Orientation:  Full (Time, Place, and Person)  Thought Content:  Illogical, Delusions and Paranoid Ideation  Suicidal Thoughts:  No  Homicidal Thoughts:  No  Memory:  Immediate;   Poor Recent;   Poor  Judgement:  Impaired  Insight:  Lacking  Psychomotor Activity:  Normal  Concentration:  Concentration: Poor  Recall:  Poor  Fund of Knowledge:  Poor  Language:  Good  Akathisia:  No  Handed:  Right  AIMS (if indicated):     Assets:  Social Support  ADL's:  Intact  Cognition:  WNL  Sleep:  Number of Hours: 6.25     Treatment Plan Summary: Daily contact with patient to assess and evaluate symptoms and progress in treatment, Medication management and Plan is to:   -Continue Risperidone 1 mg PO QHS for mood stability -Ordered TSH, RPR, Folate, and Vitamin B12 labs -Continue Mirtazapine 7.5 mg PO QHS for mood stability -Continue Hydroxyzine 25  mg PO Q6H PRN for anxiety -CT scan ordered due to confusion and memory loss -Encourage group therapy participation  Maryfrances Bunnellravis B Roma Bondar, FNP 05/03/2017, 1:12 PM

## 2017-05-03 NOTE — Progress Notes (Signed)
Patient was up in the dayroom briefly before he retreated to his room. He did not talk with the writer nor did he allow his vitals to be taken. He refused his hs medications. Safety maintained with 15 min checks.

## 2017-05-03 NOTE — Progress Notes (Signed)
Patient did not attend the evening speaker AA meeting. Pt was notified that group was beginning but remained in bed.   

## 2017-05-03 NOTE — [facility] Suicide Risk Assessment (Signed)
[facility] INPATIENT:  Family/Significant Other Suicide Prevention Education  Suicide Prevention Education:  Patient Refusal for Family/Significant Other Suicide Prevention Education: The patient [name] has refused to provide written consent for family/significant other to be provided Family/Significant Other Suicide Prevention Education during admission and/or prior to discharge.  Physician notified.  Briefly went over brochure on suicide prevention with pt, gave him the brochure, which he promptly threw in the trash before leaving the room.  Carloyn JaegerMareida J Grossman-Orr 05/03/2017, 8:47 AM

## 2017-05-03 NOTE — Progress Notes (Signed)
  D Pt has been withdrawn and quiet. Was able to attend the groups but would not participate within the groups. Pt is evasive and when interacting is negative and argumentative. Has poor eye contact and has minimal responses. Denies hallucinations of any sort. Is disheveled. Rates his depression at a 10, hopelessness at a 10 and his anxiety at a 10.  Pt was given a MMSE and scored a 19/30.  A) Frequent contact with Pt throughout the shift. Given support and reassurance. Praised for his ability to go to group. R) Denies SI and HI. Remains withdrawn and interacts little with others.

## 2017-05-04 ENCOUNTER — Encounter (HOSPITAL_COMMUNITY): Payer: Self-pay

## 2017-05-04 ENCOUNTER — Ambulatory Visit (HOSPITAL_COMMUNITY)
Admit: 2017-05-04 | Discharge: 2017-05-04 | Disposition: A | Discharge status: Home / Self Care | Payer: Medicaid Other | Attending: Psychiatry | Admitting: Psychiatry

## 2017-05-04 LAB — VITAMIN B12: VITAMIN B 12: 273 pg/mL (ref 180–914)

## 2017-05-04 MED ORDER — IOPAMIDOL (ISOVUE-300) INJECTION 61%
INTRAVENOUS | Status: AC
  Filled 2017-05-04: qty 75

## 2017-05-04 MED ORDER — IOPAMIDOL (ISOVUE-300) INJECTION 61%
75.0000 mL | Freq: Once | INTRAVENOUS | Status: AC | PRN
  Administered 2017-05-04: 75 mL via INTRAVENOUS

## 2017-05-04 MED ORDER — RISPERIDONE 2 MG PO TABS
2.0000 mg | ORAL_TABLET | Freq: Every day | ORAL | Status: DC
Start: 2017-05-04 — End: 2017-05-06
  Administered 2017-05-04 – 2017-05-05 (×2): 2 mg via ORAL
  Filled 2017-05-04 (×4): qty 1

## 2017-05-04 NOTE — Plan of Care (Signed)
Problem: Safety: Goal: Periods of time without injury will increase Outcome: Completed/Met Date Met: 05/04/17 Patient has not engaged in self harm, denies thoughts to do so. No SI verbalized.

## 2017-05-04 NOTE — Plan of Care (Signed)
Problem: Coping: Goal: Ability to verbalize frustrations and anger appropriately will improve Outcome: Not Progressing Patient does not respond appropriately to questions asked  or engage in conversation to verbalize his frustrations he will look at person and look away.

## 2017-05-04 NOTE — Progress Notes (Signed)
D: Patient observed up and restless on the unit. Frequent contacts made 1:1 throughout shift. Patient's affect angry, irritable with congruent mood. "Why are you sending me for more tests? Aint nothing wrong with me. Everyone has memory problems. At home I can take my own advil. I don't have to wait on you people." Per self inventory and discussions with writer, rates depression, hopelessness and anxiety all at a 10/10. Rates sleep as poor, appetite as fair, energy as low and concentration as poor. Continues to complain of a persistent headache of a 9/10, no other physical complaints.   A: Medicated per orders, prn tylenol, advil, and vistaril given for complaints. Level III obs in place for safety. Emotional support offered and self inventory reviewed. Encouraged completion of Suicide Safety Plan and programming participation. Discussed POC with MD, SW.    R: Patient verbalizes understanding of POC. On reassess, patient reports no change in pain, slight decrease in anxiety. Patient denies SI/HI/AVH and remains safe on level III obs.

## 2017-05-04 NOTE — Progress Notes (Signed)
[facility] MD Progress Note  05/04/2017 4:24 PM [name]  MRN:  [id number]   Subjective:  Patient states he continues to feel easily frustrated , and complains of insomnia. Denies suicidal ideations, denies hallucinations.  Denies medication side effects.  Objective: I have discussed case with treatment team, and have met with patient. Patient remains guarded and vaguely paranoid. There is some improvement compared to admission presentation- he is less irritable, less guarded, and more verbal. He has a long history of homelessness and [place]- states " I don't like people", and reports that he makes efforts to avoid public places if possible . Staff has reported concerns about cognitive functioning - at this time full MMSE not possible as patient remains guarded and reluctant to cooperate with full testing- he is alert, attentive, no evidence of delirium, he is oriented to "Guys", identified date as " Monday ,September 2018"  Recall was 3/3 immediate and 2/3 at 5 minutes, was able to name common objects without difficulty, spelled WORLD backwards with two mistakes . No grossly disruptive behaviors on unit, visible in day room at times, going to some groups, but participation is limited  Denies suicidal ideations. Denies hallucinations. Denies medication side effects.  No current symptoms of alcohol withdrawal- no acute distress,no diaphoresis , vitals stable.   Principal Problem: MDD (major depressive disorder), recurrent episode, severe ([place]) Diagnosis:   Patient Active Problem List   Diagnosis Date Noted  . MDD (major depressive disorder), recurrent episode, severe ([place]) [F33.2] 04/30/2017  . HTN (hypertension) [I10] 04/01/2016  . Homeless single person [Z59.0] 04/01/2016  . Tobacco abuse disorder [Z72.0] 04/01/2016   Total Time spent with patient: 20 minutes  Past Psychiatric History: See H&P  Past Medical History:  Past Medical History:   Diagnosis Date  . Alcoholic ([place])   . Bipolar 1 disorder ([place])   . Depression   . Diabetes mellitus without complication ([place])   . Schizoaffective schizophrenia [facility])     Past Surgical History:  Procedure Laterality Date  . DENTAL SURGERY     wisdom teeth   Family History: History reviewed. No pertinent family history. Family Psychiatric  History: See H&P Social History:  History  Alcohol Use  . Yes     History  Drug Use No    Social History   Social History  . Marital status: Single    Spouse name: N/A  . Number of children: N/A  . Years of education: N/A   Social History Main Topics  . Smoking status: Current Every Day Smoker    Packs/day: 2.00    Types: Cigarettes  . Smokeless tobacco: Never Used  . Alcohol use Yes  . Drug use: No  . Sexual activity: Not Asked   Other Topics Concern  . None   Social History Narrative  . None   Additional Social History:    Pain Medications: denies Prescriptions: denies Over the Counter: denies History of alcohol / drug use?: Yes Longest period of sobriety (when/how long): "months" Negative Consequences of Use: Financial, Personal relationships Withdrawal Symptoms:  (none) Name of Substance 1: alcohol 1 - Age of First Use: 16 1 - Amount (size/oz): 48 oz of beer 1 - Frequency: daily 1 - Duration: ongoing 1 - Last Use / Amount: last night  Sleep: Fair  Appetite:  Fair  Current Medications: Current Facility-Administered Medications  Medication Dose Route Frequency Provider Last Rate Last Dose  . acetaminophen (TYLENOL) tablet 650 mg  650 mg Oral Q6H PRN Money, Lowry Ram, FNP   650 mg at 05/04/17 0944  . alum & mag hydroxide-simeth (MAALOX/MYLANTA) 200-200-20 MG/5ML suspension 30 mL  30 mL Oral Q4H PRN Money, Lowry Ram, FNP      . chlordiazePOXIDE (LIBRIUM) capsule 25 mg  25 mg Oral Q6H PRN Tyqwan Pink, Myer Peer, MD   25 mg at 05/02/17 0109  . hydrocortisone cream 0.5 %   Topical TID Money, Lowry Ram, FNP      .  hydrOXYzine (ATARAX/VISTARIL) tablet 25 mg  25 mg Oral Q6H PRN Dorothee Napierkowski, Myer Peer, MD   25 mg at 05/04/17 0944  . ibuprofen (ADVIL,MOTRIN) tablet 600 mg  600 mg Oral Q8H PRN Money, Lowry Ram, FNP   600 mg at 05/04/17 1214  . indomethacin (INDOCIN) capsule 50 mg  50 mg Oral TID PRN Money, Lowry Ram, FNP      . loperamide (IMODIUM) capsule 2-4 mg  2-4 mg Oral PRN Morrie Daywalt, Myer Peer, MD      . magnesium hydroxide (MILK OF MAGNESIA) suspension 30 mL  30 mL Oral Daily PRN Money, Darnelle Maffucci B, FNP      . mirtazapine (REMERON) tablet 7.5 mg  7.5 mg Oral QHS Cahterine Heinzel, Myer Peer, MD      . multivitamin with minerals tablet 1 tablet  1 tablet Oral Daily Treon Kehl, Myer Peer, MD   1 tablet at 05/04/17 0941  . nicotine (NICODERM CQ - dosed in mg/24 hours) patch 21 mg  21 mg Transdermal Q0600 Carvin Almas, Myer Peer, MD   21 mg at 05/04/17 0941  . ondansetron (ZOFRAN-ODT) disintegrating tablet 4 mg  4 mg Oral Q6H PRN Davie Claud, Myer Peer, MD      . risperiDONE (RISPERDAL) tablet 1 mg  1 mg Oral QHS Jimmey Hengel A, MD      . thiamine (B-1) injection 100 mg  100 mg Intramuscular Once Sabre Romberger A, MD      . thiamine (VITAMIN B-1) tablet 100 mg  100 mg Oral Daily Graciano Batson, Myer Peer, MD   100 mg at 05/04/17 3235   Facility-Administered Medications Ordered in Other Encounters  Medication Dose Route Frequency Provider Last Rate Last Dose  . iopamidol (ISOVUE-300) 61 % injection             Lab Results:  Results for orders placed or performed during the hospital encounter of 04/30/17 (from the past 48 hour(s))  TSH     Status: None   Collection Time: 05/03/17  5:45 PM  Result Value Ref Range   TSH 4.147 0.350 - 4.500 uIU/mL    Comment: Performed by a 3rd Generation assay with a functional sensitivity of <=0.01 uIU/mL. Performed at [facility], [place] [address] [place], [place] [address]   Vitamin B12     Status: None   Collection Time: 05/03/17  5:45 PM  Result Value Ref Range   Vitamin B-12 273 180 -  914 pg/mL    Comment: (NOTE) This assay is not validated for testing neonatal or myeloproliferative syndrome specimens for Vitamin B12 levels. Performed at [facility], [place] [address], [place], [place] [address]     Blood Alcohol level:  No results found for: [facility]  Metabolic Disorder Labs: Lab Results  Component Value Date   HGBA1C 5.3 04/30/2017   MPG 105.41 04/30/2017   No results found for: PROLACTIN Lab Results  Component Value Date   CHOL 233 (H) 04/30/2017   TRIG 224 (H) 04/30/2017  HDL 46 04/30/2017   CHOLHDL 5.1 04/30/2017   VLDL 45 (H) 04/30/2017   LDLCALC 142 (H) 04/30/2017    Physical Findings: AIMS: Facial and Oral Movements Muscles of Facial Expression: None, normal Lips and Perioral Area: None, normal Jaw: None, normal Tongue: None, normal,Extremity Movements Upper (arms, wrists, hands, fingers): None, normal Lower (legs, knees, ankles, toes): None, normal, Trunk Movements Neck, shoulders, hips: None, normal, Overall Severity Severity of abnormal movements (highest score from questions above): None, normal Incapacitation due to abnormal movements: None, normal Patient's awareness of abnormal movements (rate only patient's report): No Awareness, Dental Status Current problems with teeth and/or dentures?: No Does patient usually wear dentures?: No  CIWA:  CIWA-Ar Total: 1 COWS:     Musculoskeletal: Strength & Muscle Tone: within normal limits Gait & Station: normal Patient leans: N/A  Psychiatric Specialty Exam: Physical Exam  Nursing note and vitals reviewed. Constitutional: He is oriented to person, place, and time. He appears well-developed.  Cardiovascular: Normal rate.   Respiratory: Effort normal.  Musculoskeletal: Normal range of motion.  Neurological: He is alert and oriented to person, place, and time.    Review of Systems  Constitutional: Negative.   HENT: Negative.   Eyes: Negative.   Respiratory: Negative.    Cardiovascular: Negative.   Gastrointestinal: Negative.   Genitourinary: Negative.   Musculoskeletal: Negative.   Skin: Negative.   Neurological: Negative.   Endo/Heme/Allergies: Negative.   denies headache, denies chest pain, no shortness of breath, no vomiting   Blood pressure (!) 89/73, pulse 97, temperature 98.5 F (36.9 C), temperature source Oral, resp. rate 16, height 6' (1.829 m), weight 67.1 kg (148 lb), SpO2 99 %.Body mass index is 20.07 kg/m.  General Appearance: Fairly Groomed- guarded , suspicious   Eye Contact:  Good  Speech:  Normal Rate  Volume:  Normal  Mood:  minimizes depression]  Affect:  vaguely irritable, but improved compared to admission and smiled briefly at times   Thought Process:  Linear and Descriptions of Associations: Intact- concrete/ but linear  Orientation:  Fully alert and attentive   Thought Content:  denies hallucinations, no overt delusions expressed, but expresses paranoid ideations, states for example that " I always have to have two exit routes, because in [place] the police can show up any time when you are homeless "  Suicidal Thoughts:  denies suicidal ideations at this time, contracts for safety on unit   Homicidal Thoughts:  No denies homicidal ideations   Memory:  Immediate 3/3 , recent  2/3   Judgement:  Fair  Insight:  Fair  Psychomotor Activity:  Normal  Concentration:  Concentration: Fair and Attention Span: Fair  Recall:  [employer] of Knowledge:  Fair  Language:  Fair  Akathisia:  No  Handed:  Right  AIMS (if indicated):     Assets:  Social Support  ADL's:  Improving compared to admission   Cognition:  WNL  Sleep:  Number of Hours: 6.5   Assessment - patient presents partially improved compared to admission- less severely irritable, more verbal, better able to tolerate session. He reports long history of preferring to be alone, and reports vague paranoid ideations - consider Cluster A personality features .Staff reports  patient has presented with cognitive dysfunction- this may be partly related to lack of cooperation with cognitive questions. At this time patient presents alert and attentive and there is currently no evidence of delirium . A CT scan was done earlier today, result currently pending  Treatment  Plan Summary: Daily contact with patient to assess and evaluate symptoms and progress in treatment, Medication management and Plan is to:   Treatment plan reviewed as below today 8/13  Continue to encourage group and milieu participation to work on coping skills and symptom reduction  Treatment team working on disposition planning options  -Increase  Risperidone to 2  mg PO QHS for mood stability -Continue Mirtazapine 7.5 mg PO QHS for mood stability -Continue Hydroxyzine 25 mg PO Q6H PRN for anxiety   Jenne Campus, MD 05/04/2017, 4:24 PMPatient ID: [name], male   DOB: 11-06-1960, 56 y.o.   MRN: [id number]

## 2017-05-04 NOTE — Progress Notes (Signed)
Pt immediately left the room when staff announce it's time for wrap up group.

## 2017-05-04 NOTE — Tx Team (Signed)
Interdisciplinary Treatment and Diagnostic Plan Update 05/04/2017 Time of Session: 9:30am  [name]  MRN: [id number]  Principal Diagnosis: MDD (major depressive disorder), recurrent episode, severe ([place])  Secondary Diagnoses: Principal Problem:   MDD (major depressive disorder), recurrent episode, severe ([place])   Current Medications:  Current Facility-Administered Medications  Medication Dose Route Frequency Provider Last Rate Last Dose  . acetaminophen (TYLENOL) tablet 650 mg  650 mg Oral Q6H PRN Money, Lowry Ram, FNP   650 mg at 05/04/17 0944  . alum & mag hydroxide-simeth (MAALOX/MYLANTA) 200-200-20 MG/5ML suspension 30 mL  30 mL Oral Q4H PRN Money, Lowry Ram, FNP      . chlordiazePOXIDE (LIBRIUM) capsule 25 mg  25 mg Oral Q6H PRN Cobos, Myer Peer, MD   25 mg at 05/02/17 2706  . hydrocortisone cream 0.5 %   Topical TID Money, Lowry Ram, FNP      . hydrOXYzine (ATARAX/VISTARIL) tablet 25 mg  25 mg Oral Q6H PRN Cobos, Myer Peer, MD   25 mg at 05/04/17 0944  . ibuprofen (ADVIL,MOTRIN) tablet 600 mg  600 mg Oral Q8H PRN Money, Lowry Ram, FNP   600 mg at 05/03/17 0758  . indomethacin (INDOCIN) capsule 50 mg  50 mg Oral TID PRN Money, Lowry Ram, FNP      . loperamide (IMODIUM) capsule 2-4 mg  2-4 mg Oral PRN Cobos, Myer Peer, MD      . magnesium hydroxide (MILK OF MAGNESIA) suspension 30 mL  30 mL Oral Daily PRN Money, Darnelle Maffucci B, FNP      . mirtazapine (REMERON) tablet 7.5 mg  7.5 mg Oral QHS Cobos, Myer Peer, MD      . multivitamin with minerals tablet 1 tablet  1 tablet Oral Daily Cobos, Myer Peer, MD   1 tablet at 05/04/17 0941  . nicotine (NICODERM CQ - dosed in mg/24 hours) patch 21 mg  21 mg Transdermal Q0600 Cobos, Myer Peer, MD   21 mg at 05/04/17 0941  . ondansetron (ZOFRAN-ODT) disintegrating tablet 4 mg  4 mg Oral Q6H PRN Cobos, Myer Peer, MD      . risperiDONE (RISPERDAL) tablet 1 mg  1 mg Oral QHS Cobos, Fernando A, MD      . thiamine (B-1) injection 100 mg  100 mg  Intramuscular Once Cobos, Fernando A, MD      . thiamine (VITAMIN B-1) tablet 100 mg  100 mg Oral Daily Cobos, Myer Peer, MD   100 mg at 05/04/17 0941    PTA Medications: Facility-Administered Medications Prior to Admission  Medication Dose Route Frequency Provider Last Rate Last Dose  . pneumococcal 23 valent vaccine (PNU-IMMUNE) injection 0.5 mL  0.5 mL Intramuscular Once Maren Reamer, MD       Prescriptions Prior to Admission  Medication Sig Dispense Refill Last Dose  . ibuprofen (ADVIL,MOTRIN) 200 MG tablet Take 600-800 mg by mouth every 6 (six) hours as needed (For headache, pain or gout.).   04/30/2017  . indomethacin (INDOCIN) 50 MG capsule Take 50 mg by mouth 3 (three) times daily as needed (For gout.).   Past Week  . loratadine (CLARITIN) 10 MG tablet Take 1 tablet (10 mg total) by mouth daily. (Patient taking differently: Take 10 mg by mouth daily as needed for allergies. ) 30 tablet 02 March 2017    Treatment Modalities: Medication Management, Group therapy, Case management,  1 to 1 session with clinician, Psychoeducation, Recreational therapy.  Patient Stressors: Financial difficulties Loss of relationship Substance abuse Patient  Strengths: Ability for insight Agricultural engineer for treatment/growth  Physician Treatment Plan for Primary Diagnosis: MDD (major depressive disorder), recurrent episode, severe ([place]) Long Term Goal(s): Improvement in symptoms so as ready for discharge Short Term Goals: Ability to verbalize feelings will improve Ability to demonstrate self-control will improve Compliance with prescribed medications will improve  Medication Management: Evaluate patient's response, side effects, and tolerance of medication regimen.  Therapeutic Interventions: 1 to 1 sessions, Unit Group sessions and Medication administration.  Evaluation of Outcomes: Not Met  Physician Treatment Plan for Secondary Diagnosis: Principal Problem:   MDD (major  depressive disorder), recurrent episode, severe ([place])  Long Term Goal(s): Improvement in symptoms so as ready for discharge  Short Term Goals: Ability to verbalize feelings will improve Ability to demonstrate self-control will improve Compliance with prescribed medications will improve  Medication Management: Evaluate patient's response, side effects, and tolerance of medication regimen.  Therapeutic Interventions: 1 to 1 sessions, Unit Group sessions and Medication administration.  Evaluation of Outcomes: Not Met  RN Treatment Plan for Primary Diagnosis: MDD (major depressive disorder), recurrent episode, severe ([place]) Long Term Goal(s): Knowledge of disease and therapeutic regimen to maintain health will improve  Short Term Goals: Ability to identify and develop effective coping behaviors will improve and Compliance with prescribed medications will improve  Medication Management: RN will administer medications as ordered by provider, will assess and evaluate patient's response and provide education to patient for prescribed medication. RN will report any adverse and/or side effects to prescribing provider.  Therapeutic Interventions: 1 on 1 counseling sessions, Psychoeducation, Medication administration, Evaluate responses to treatment, Monitor vital signs and CBGs as ordered, Perform/monitor CIWA, COWS, AIMS and Fall Risk screenings as ordered, Perform wound care treatments as ordered.  Evaluation of Outcomes: Not Met  LCSW Treatment Plan for Primary Diagnosis: MDD (major depressive disorder), recurrent episode, severe ([place]) Long Term Goal(s): Safe transition to appropriate next level of care at discharge, Engage patient in therapeutic group addressing interpersonal concerns. Short Term Goals: Engage patient in aftercare planning with referrals and resources, Increase ability to appropriately verbalize feelings, Facilitate patient progression through stages of change regarding substance  use diagnoses and concerns, Identify triggers associated with mental health/substance abuse issues and Increase skills for wellness and recovery  Therapeutic Interventions: Assess for all discharge needs, 1 to 1 time with Social worker, Explore available resources and support systems, Assess for adequacy in community support network, Educate family and significant other(s) on suicide prevention, Complete Psychosocial Assessment, Interpersonal group therapy.  Evaluation of Outcomes: Not Met  Progress in Treatment: Attending groups: No Participating in groups: No Taking medication as prescribed: Yes, MD continues to assess for medication changes as needed Toleration medication: Yes, no side effects reported at this time Family/Significant other contact made: No, pt declined contact. Patient understands diagnosis: Limited insight  Discussing patient identified problems/goals with staff: Yes Medical problems stabilized or resolved: Yes Denies suicidal/homicidal ideation: Yes Issues/concerns per patient self-inventory: None Other: N/A  New problem(s) identified: None identified at this time.   New Short Term/Long Term Goal(s): None identified at this time.   Discharge Plan or Barriers: CSW still assessing for appropriate plan. Pt presents as agitated and guarded and is currently denying any outpatient follow-up.  Reason for Continuation of Hospitalization:  Anxiety  Depression Medication stabilization Suicidal ideation Withdrawal symptoms  Estimated Length of Stay: 1-3 days; Estimated discharge date 05/07/17  Attendees: Patient: 05/04/2017 10:23 AM  Physician: Dr. Parke Poisson 05/04/2017 10:23 AM  Nursing: Jinny Sanders, RN 05/04/2017 10:23  AM  RN Care Manager: 05/04/2017 10:23 AM  Social Worker: Matthew Saras, [place] 05/04/2017 10:23 AM  Recreational Therapist:  05/04/2017 10:23 AM  Other: Lindell Spar, NP 05/04/2017 10:23 AM  Other:  05/04/2017 10:23 AM  Other: 05/04/2017 10:23 AM   Scribe for  Treatment Team: Georga Kaufmann, MSW,LCSWA 05/04/2017 10:23 AM

## 2017-05-04 NOTE — [facility] Group Notes (Addendum)
[facility] LCSW Group Therapy  05/04/2017 1:15pm  Type of Therapy: Group Therapy   Topic: Overcoming Obstacles  Participation Level: Minimal  Participation Quality: Appropriate   Affect: Flat and Irritable   Cognitive: Disorganized   Insight: Limited Insight   Engagement in Therapy: Minimal  Modes of Intervention: Discussion, Exploration, Problem-solving and Support  Description of Group:  In this group patients will be encouraged to explore what they see as obstacles to their own wellness and recovery. They will be guided to discuss their thoughts, feelings, and behaviors related to these obstacles. The group will process together ways to cope with barriers, with attention given to specific choices patients can make. Each patient will be challenged to identify changes they are motivated to make in order to overcome their obstacles. This group will be process-oriented, with patients participating in exploration of their own experiences as well as giving and receiving support and challenge from other group members.  Summary of Patient Progress:  When CSW asked pt what obstacles he was currently facing pt stated "Nothing". CSW asked pt if there were any obstacles that he anticipates facing when he leaves the hospital and pt stated "Rain". When CSW asked pt to expound upon this pt shrugged his shoulders and refused to talk further. Pt did not participate in the discussion otherwise. However, pt did hand other pts tissues as they were crying during group.   Therapeutic Modalities:  Cognitive Behavioral Therapy Solution Focused Therapy Motivational Interviewing Relapse Prevention Therapy  Jonathon JordanLynn B Briar Witherspoon, MSW, Theresia MajorsLCSWA [phone]

## 2017-05-05 DIAGNOSIS — Z560 Unemployment, unspecified: Secondary | ICD-10-CM

## 2017-05-05 LAB — RPR: RPR Ser Ql: NONREACTIVE

## 2017-05-05 LAB — FOLATE RBC
Folate, Hemolysate: 385 ng/mL
Folate, RBC: 970 ng/mL (ref >498)
Hematocrit: 39.7 % (ref 37.5–51.0)

## 2017-05-05 MED ORDER — MIRTAZAPINE 15 MG PO TABS
15.0000 mg | ORAL_TABLET | Freq: Every day | ORAL | Status: DC
  Administered 2017-05-05: 15 mg via ORAL
  Filled 2017-05-05 (×3): qty 1

## 2017-05-05 NOTE — Plan of Care (Signed)
Problem: Medication: Goal: Compliance with prescribed medication regimen will improve Outcome: Progressing Patient attending groups and is medication compliant. Patient reports hope for discharge.

## 2017-05-05 NOTE — [facility] Group Notes (Signed)
[facility] 05/05/2017 1:15pm  Type of Therapy: Mental Health Association Presentation  Participation Level: Pt invited. Did not attend.   Vito BackersLynn B. Beverely PaceBryant, MSW, [facility]WA 05/05/2017 4:38 PM

## 2017-05-05 NOTE — Progress Notes (Signed)
[facility] MD Progress Note  05/05/2017 5:27 PM [name]  MRN:  [id number] Subjective:   56 y.o. Caucasian male, single, [place] the [place]. History of Alcohol Use Disorder. Self presented for treatment. Reported suicidal thoughts at presentation. Routine labs were essentially normal.   Chart reviewed today. Patient discussed at team. Scored 19/30 on MMSE. Refused retesting.   Staff reports he is not interacting with peers, he is eccentric. He has not voiced any suicidal thoughts. He has not been observed to be internally stimulated. No behavioral issues.   Seen today. Was playing chess with a peer prior to interview. Says he was living with his girlfriend until she died two years ago. 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 Says after she passed, he deteriorated. He became homeless and lived in a tent in the woods. 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 Says he was tired of living in the woods. Says he is not really suicidal. He wants to have appropriate housing. He showed me papers given to him here. Plans to get into the shelter and use his income to seek appropriate housing. Says he is not depressed. He does not have any abnormal perception. He is not expressing any abnormal belief. States that medications has never been beneficial in the past. He feels good on current dose of Mirtazapine. Hopes he would be able to get discharged tomorrow.   Principal Problem: MDD (major depressive disorder), recurrent episode, severe (HCC) Diagnosis:   Patient Active Problem List   Diagnosis Date Noted  . MDD (major depressive disorder), recurrent episode, severe (HCC) [F33.2] 04/30/2017  . HTN (hypertension) [I10] 04/01/2016  . Homeless single person [Z59.0] 04/01/2016  . Tobacco abuse disorder [Z72.0] 04/01/2016   Total Time spent with patient: 20 minutes  Past Psychiatric History: As in H&P  Past Medical History:  Past Medical History:   Diagnosis Date  . Alcoholic (HCC)   . Bipolar 1 disorder (HCC)   . Depression   . Diabetes mellitus without complication (HCC)   . Schizoaffective schizophrenia [facility])     Past Surgical History:  Procedure Laterality Date  . DENTAL SURGERY     wisdom teeth   Family History: History reviewed. No pertinent family history. Family Psychiatric  History: As in H&P Social History:  History  Alcohol Use  . Yes     History  Drug Use No    Social History   Social History  . Marital status: Single    Spouse name: N/A  . Number of children: N/A  . Years of education: N/A   Social History Main Topics  . Smoking status: Current Every Day Smoker    Packs/day: 2.00    Types: Cigarettes  . Smokeless tobacco: Never Used  . Alcohol use Yes  . Drug use: No  . Sexual activity: Not Asked   Other Topics Concern  . None   Social History Narrative  . None   Additional Social History:    Pain Medications: denies Prescriptions: denies Over the Counter: denies History of alcohol / drug use?: Yes Longest period of sobriety (when/how long): "months" Negative Consequences of Use: Financial, Personal relationships Withdrawal Symptoms:  (none) Name of Substance 1: alcohol 1 - Age of First Use: 16 1 - Amount (size/oz): 48 oz of beer 1 - Frequency: daily 1 - Duration: ongoing 1 - Last Use / Amount: last night  Sleep: Good  Appetite:  Good  Current Medications: Current Facility-Administered Medications  Medication Dose Route Frequency Provider Last Rate Last Dose  . acetaminophen (TYLENOL) tablet 650 mg  650 mg Oral Q6H PRN Money, Gerlene Burdockravis B, FNP   650 mg at 05/04/17 1731  . alum & mag hydroxide-simeth (MAALOX/MYLANTA) 200-200-20 MG/5ML suspension 30 mL  30 mL Oral Q4H PRN Money, Feliz Beamravis B, FNP      . ibuprofen (ADVIL,MOTRIN) tablet 600 mg  600 mg Oral Q8H PRN Money, Gerlene Burdockravis B, FNP   600 mg at 05/05/17 1724  . indomethacin (INDOCIN) capsule 50 mg  50 mg Oral  TID PRN Money, Gerlene Burdockravis B, FNP      . magnesium hydroxide (MILK OF MAGNESIA) suspension 30 mL  30 mL Oral Daily PRN Money, Feliz Beamravis B, FNP      . mirtazapine (REMERON) tablet 7.5 mg  7.5 mg Oral QHS Cobos, Rockey SituFernando A, MD   7.5 mg at 05/04/17 2127  . multivitamin with minerals tablet 1 tablet  1 tablet Oral Daily Cobos, Rockey SituFernando A, MD   1 tablet at 05/05/17 0757  . nicotine (NICODERM CQ - dosed in mg/24 hours) patch 21 mg  21 mg Transdermal Q0600 Cobos, Rockey SituFernando A, MD   21 mg at 05/05/17 0641  . risperiDONE (RISPERDAL) tablet 2 mg  2 mg Oral QHS Cobos, Rockey SituFernando A, MD   2 mg at 05/04/17 2130  . thiamine (B-1) injection 100 mg  100 mg Intramuscular Once Cobos, Fernando A, MD      . thiamine (VITAMIN B-1) tablet 100 mg  100 mg Oral Daily Cobos, Rockey SituFernando A, MD   100 mg at 05/05/17 [id number]    Lab Results:  Results for orders placed or performed during the hospital encounter of 04/30/17 (from the past 48 hour(s))  TSH     Status: None   Collection Time: 05/03/17  5:45 PM  Result Value Ref Range   TSH 4.147 0.350 - 4.500 uIU/mL    Comment: Performed by a 3rd Generation assay with a functional sensitivity of <=0.01 uIU/mL. Performed at [facility], [address] [place], [place] [address]   Vitamin B12     Status: None   Collection Time: 05/03/17  5:45 PM  Result Value Ref Range   Vitamin B-12 273 180 - 914 pg/mL    Comment: (NOTE) This assay is not validated for testing neonatal or myeloproliferative syndrome specimens for Vitamin B12 levels. Performed at [facility], [address] [place], [place] [address]   Folate RBC     Status: None   Collection Time: 05/03/17  5:45 PM  Result Value Ref Range   Folate, Hemolysate 385.0 Not Estab. ng/mL   Hematocrit 39.7 37.5 - 51.0 %   Folate, RBC 970 >498 ng/mL    Comment: (NOTE) Performed At: [facility] [address] [place], [place] [id number] Mila HomerHancock William F MD AO:[phone] Performed at [facility], [address] [place], [place] [address]   RPR     Status: None   Collection Time: 05/03/17  5:45 PM  Result Value Ref Range   RPR Ser Ql Non Reactive Non Reactive    Comment: (NOTE) Performed At: [facility] [place] [address] [place], [place] [id number] Mila HomerHancock William F MD NU:[phone] Performed at [facility], [address] [place], [place] [address]     Blood Alcohol level:  No results found for: [facility]  Metabolic Disorder Labs: Lab Results  Component  Value Date   HGBA1C 5.3 04/30/2017   MPG 105.41 04/30/2017   No results found for: PROLACTIN Lab Results  Component Value Date   CHOL 233 (H) 04/30/2017   TRIG 224 (H) 04/30/2017   HDL 46 04/30/2017   CHOLHDL 5.1 04/30/2017   VLDL 45 (H) 04/30/2017   LDLCALC 142 (H) 04/30/2017    Physical Findings: AIMS: Facial and Oral Movements Muscles of Facial Expression: None, normal Lips and Perioral Area: None, normal Jaw: None, normal Tongue: None, normal,Extremity Movements Upper (arms, wrists, hands, fingers): None, normal Lower (legs, knees, ankles, toes): None, normal, Trunk Movements Neck, shoulders, hips: None, normal, Overall Severity Severity of abnormal movements (highest score from questions above): None, normal Incapacitation due to abnormal movements: None, normal Patient's awareness of abnormal movements (rate only patient's report): No Awareness, Dental Status Current problems with teeth and/or dentures?: No Does patient usually wear dentures?: No  CIWA:  CIWA-Ar Total: 1 COWS:  COWS Total Score: 2  Musculoskeletal: Strength & Muscle Tone: within normal limits Gait & Station: normal Patient leans: N/A  Psychiatric Specialty Exam: Physical Exam  Constitutional: He is oriented to person, place, and time. No distress.  HENT:  Head: Normocephalic.  Neck: Normal range of motion. Neck supple.  Respiratory: Effort normal.  Neurological: He is alert and  oriented to person, place, and time.  Skin: Skin is warm and dry. He is not diaphoretic.  Psychiatric:  As above    ROS  Blood pressure 112/74, pulse 86, temperature 97.7 F (36.5 C), resp. rate 18, height 6' (1.829 m), weight 67.1 kg (148 lb), SpO2 99 %.Body mass index is 20.07 kg/m.  General Appearance: In hospital clothing, poor personal hygiene. Calm and cooperative. Good relatedness.   Eye Contact:  Good  Speech:  Clear and Coherent and Normal Rate  Volume:  Normal  Mood:  Euthymic  Affect:  Odd   Thought Process:  Linear  Orientation:  Full (Time, Place, and Person)  Thought Content:  No delusional theme. No preoccupation with violent thoughts. No negative ruminations. No obsession.  No hallucination in any modality.   Suicidal Thoughts:  No  Homicidal Thoughts:  No  Memory:  Did not cooperate with formal testing  Judgement:  Fair  Insight:  Good  Psychomotor Activity:  Normal  Concentration:  Good  Recall: Did not assess at this time  Fund of Knowledge:  Fair  Language:  Good  Akathisia:  Negative  Handed:    AIMS (if indicated):     Assets:  Communication Skills Desire for Improvement  ADL's:  Fair  Cognition:  WNL  Sleep:  Number of Hours: 6.5     Assessment and Plan  Patient is not depressed. He is not psychotic. Patient is not confused. He is not a danger to himself or others. Review of his CT scan shows some loss of gray matter in his frontal lobes without any ventricular enlargement. 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  Etiology is likely related to chronic use of alcohol. Patient is willing to get support. Would do a MOCA test tomorrow and explore disposition tomorrow.   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   PLAN: 1. Increase Mirtazapine to 15 mg HS 2. Encourage unit groups and activities 3. Continue to monitor mood, behavior and interaction with peers 4. Hopeful discharge  tomorrow.      Georgiann Cocker, MD 05/05/2017, 5:27 PM

## 2017-05-05 NOTE — Plan of Care (Signed)
Problem: Education: Goal: Ability to make informed decisions regarding treatment will improve Outcome: Progressing Nurse discussed depression/anxiety/coping skills with patient.    

## 2017-05-05 NOTE — Progress Notes (Signed)
D: Patient isolating to self. Sitting on dayroom. No interaction. Denies pain, SI/HI, AH/VH at this time. Appear anxious and occasionally irritable. Accepted his bedtime meds and went to bed.   A: Staff offered support and encouragement as needed. Routine safety checks maintained. Will continue to monitor patient.  R: Patient remains safe on unit.

## 2017-05-05 NOTE — Progress Notes (Signed)
Nursing Progress Note 1900-0730  D) Patient presents with pleasant mood and mild anxiety. Patient reports generalized "aches and pains" and states "when you get to be 56 years old, you'll understand. Oh well, I guess pain means I'm alive". Patient is seen interactive in the milieu. Patient denies SI/HI/AVH. Patient complains of  gas relieved with PRN medication. Patient contracts for safety on the unit. Patient reports sleeping well with current regimen.   A) Emotional support given. 1:1 interaction and active listening provided. Patient medicated as prescribed. Medications and plan of care reviewed with patient. Patient verbalized understanding without further questions. Snacks and fluids provided. Opportunities for questions or concerns presented to patient. Patient encouraged to continue to work on treatment goals. Labs, vital signs and patient behavior monitored throughout shift. Patient safety maintained with q15 min safety checks. Low fall risk precautions in place and reviewed with patient; patient verbalized understanding.  R) Patient receptive to interaction with nurse. Patient remains safe on the unit at this time. Patient denies any adverse medication reactions at this time. Patient is resting in bed without complaints. Will continue to monitor.

## 2017-05-05 NOTE — Social Work (Signed)
Patient accepted Transitional Care Team, services to begin on day of discharge.  Santa GeneraAnne Lavinia Mcneely, LCSW Lead Clinical Social Worker Phone:  [phone]

## 2017-05-05 NOTE — Progress Notes (Signed)
CSW made a report with [place] [facility] intake worker [name] [phone]) about pt's lack of self care and mental status.  Jonathon JordanLynn B Jeslie Lowe, MSW, Theresia MajorsLCSWA [phone]

## 2017-05-05 NOTE — Progress Notes (Signed)
CSW called [place] [facility] at [phone] to make an APS report on pt's behalf. No answer. CSW left a message. Awaiting call back.  Winslow Ederer B Nadeen Shipman, MSW, LCSWA [phone]  

## 2017-05-05 NOTE — Progress Notes (Signed)
Recreation Therapy Notes  Animal-Assisted Activity (AAA) Program Checklist/Progress Notes Patient Eligibility Criteria Checklist & Daily Group note for Rec TxIntervention  Date: 08.14.2018 Time: 2:45pm [address]   AAA/T Program Assumption of Risk Form signed by Patient/ or Parent Legal Guardian Yes  Patient is free of allergies or sever asthma Yes  Patient reports no fear of animals Yes  Patient reports no history of cruelty to animals Yes  Patient understands his/her participation is voluntary Yes  Patient washes hands before animal contact Yes  Patient washes hands after animal contact Yes  Behavioral Response: Appropriate   Education:Hand Washing, Appropriate Animal Interaction   Education Outcome: Acknowledges education.   Clinical Observations/Feedback: Patient attended session and interacted appropriately with therapy dog and peers.   [name], LRT/CTRS        Marshea Wisher L 05/05/2017 3:01 PM

## 2017-05-05 NOTE — Progress Notes (Signed)
D:  Patient's self inventory sheet, patient has poor sleep, sleep medication not helpful.  Fair appetite, low energy level, poor concentration.  Rated depression, hopeless and anxiety #8.  Denied withdrawals.  Denied SI.  Physical problems, lightheaded, pain, headaches, blurred vision.  Physical pain, worst pain #10, back, head, pain medication not helpful.  No discharge plans. A:  Medications administered per MD orders.  Emotional support and encouragement given patient. R:  Denied SI and HI, contracts for safety.  Denied A/V hallucinations.  Safety maintained with 15 minute checks.

## 2017-05-06 MED ORDER — RISPERIDONE 2 MG PO TABS: 2.0000 mg | ORAL_TABLET | Freq: Every day | ORAL | 0 refills | Status: DC

## 2017-05-06 MED ORDER — MIRTAZAPINE 15 MG PO TABS: 15.0000 mg | ORAL_TABLET | Freq: Every day | ORAL | 0 refills | Status: DC

## 2017-05-06 MED ORDER — INDOMETHACIN 50 MG PO CAPS: 50.0000 mg | ORAL_CAPSULE | Freq: Three times a day (TID) | ORAL | 0 refills | Status: DC | PRN

## 2017-05-06 NOTE — Discharge Summary (Signed)
Physician Discharge Summary Note  Patient:  [name] is an 56 y.o., male MRN:  [id number] DOB:  03/27/1961 Patient phone:  [phone] (home)  Patient address:   [address] [place] [address],  Total Time spent with patient: 20 minutes  Date of Admission:  04/30/2017 Date of Discharge: 05/06/17   Reason for Admission:  Worsening depression with SI  Principal Problem: MDD (major depressive disorder), recurrent episode, severe [facility](HCC) Discharge Diagnoses: Patient Active Problem List   Diagnosis Date Noted  . MDD (major depressive disorder), recurrent episode, severe (HCC) [F33.2] 04/30/2017  . HTN (hypertension) [I10] 04/01/2016  . Homeless single person [Z59.0] 04/01/2016  . Tobacco abuse disorder [Z72.0] 04/01/2016    Past Psychiatric History: MDD  Past Medical History:  Past Medical History:  Diagnosis Date  . Alcoholic (HCC)   . Bipolar 1 disorder (HCC)   . Depression   . Diabetes mellitus without complication (HCC)   . Schizoaffective schizophrenia [facility](HCC)     Past Surgical History:  Procedure Laterality Date  . DENTAL SURGERY     wisdom teeth   Family History: History reviewed. No pertinent family history. Family Psychiatric  History: Denies Social History:  History  Alcohol Use  . Yes     History  Drug Use No    Social History   Social History  . Marital status: Single    Spouse name: N/A  . Number of children: N/A  . Years of education: N/A   Social History Main Topics  . Smoking status: Current Every Day Smoker    Packs/day: 2.00    Types: Cigarettes  . Smokeless tobacco: Never Used  . Alcohol use Yes  . Drug use: No  . Sexual activity: Not Asked   Other Topics Concern  . None   Social History Narrative  . None    Hospital Course:  56 y.o.malewho presents voluntarilyaccompanied by reporting symptoms of depression and suicidal ideation with no plan or history of attempts. Pt states that he has been on a downward  spiral since his GF died 2 years ago, and he curently isolates himself from everyone and lives in the woods.Pt has a history of alcohol dependence, Schizophrenia/Schizoaffective Disorder and Bipolar per his chart. He currently denies AVH andparanoia, but appears guarded. He states that he drinks 2 24 oz beers daily (last drink last night), but says he can go "months" without drinking. He states that he does get the shakes when he doesn't drink. He states that he has been hospitalized in the past for mental health reasons, but he does not remember where or when other than "a long time ago". Pt reports he tried at one point Abilify and Zoloft, but it did not work. He states that [facility] [place] [facility], but has not had any treatment in a long time. "If I leave, I will just go back to the woods to my bottle of wine, and I am going downhill". Pt acknowledges symptoms including: sleeplessness, loss of appetite.PT denieshomicidal ideation/ history of violence. Pt states current stressors include his living situation. Pt has nosupports, and gets his mail form the post office. Pt denies history of abuse and trauma.Pt deniesfamily history of MH or SA issues.Pt has poorinsight and judgment. Pt's memory appears impaired. Pt denies legal history. MSE: Pt is casually dressed with poor grooming, alert, oriented x4 with normal speech and normal motor behavior. Eye contact is fair. Pt's mood is depressed and affect is depressed and  guarded. Affect is congruent with mood. Thought process is coherent and relevant. There is no indication Pt is currently responding to internal stimuli or experiencing delusional thought content. Pt was cooperative throughout assessment. Pt is currently unable to contract for safety outside the hospital and wants inpatient psychiatric treatment. Patient appears with bizarre behavior and likes to throw his hands up in the air and state "I don't know." He admits some paranoia and feels  that some one is continuously watching him. He keeps spinning around in the room. She does not appear to be responding to internal stimuli though. He reports living in the woods for 10 years + and feels like he is confined. Patient appears irritable and is asked to take a Vistaril which he does. However, patient's memory is very poor. MMSE was 19 of 30. CT scan in 2017 showed no abnormalities. Blood work was added for today to include TSH, B12, Folate, and RPR. Patient seems to get more agitated when asked questions and may be due to memory loss. So know I am unsure of the history he reported on admission. Patient remains guarded and vaguely paranoid. There is some improvement compared to admission presentation- he is less irritable, less guarded, and more verbal. He has a long history of homelessness and states he lives in the woods- states " I don't like people", and reports that he makes efforts to avoid public places if possible .Staff has reported concerns about cognitive functioning - at this time full MMSE not possible as patient remains guarded and reluctant to cooperate with full testing- he is alert, attentive, no evidence of delirium, he is oriented to "[facility], [place]", identified date as " Monday ,September 2018"  Recall was 3/3 immediate and 2/3 at 5 minutes, was able to name common objects without difficulty, spelled WORLD backwards with two mistakes .No grossly disruptive behaviors on unit, visible in day room at times, going to some groups, but participation is limited  Patient had significant improvement and was started on Risperidone and gradually increased to 2 mg PO QHS. Patient's memory improved over the stay as he stabilized. Patient has agreed to follow up at [facility] and is given prescriptions for Risperidone, Vistaril, Mirtazapine, and Indocin (for gout).  Physical Findings: AIMS: Facial and Oral Movements Muscles of Facial Expression: None, normal Lips and Perioral Area:  None, normal Jaw: None, normal Tongue: None, normal,Extremity Movements Upper (arms, wrists, hands, fingers): None, normal Lower (legs, knees, ankles, toes): None, normal, Trunk Movements Neck, shoulders, hips: None, normal, Overall Severity Severity of abnormal movements (highest score from questions above): None, normal Incapacitation due to abnormal movements: None, normal Patient's awareness of abnormal movements (rate only patient's report): No Awareness, Dental Status Current problems with teeth and/or dentures?: No Does patient usually wear dentures?: No  CIWA:  CIWA-Ar Total: 0 COWS:  COWS Total Score: 2  Musculoskeletal: Strength & Muscle Tone: within normal limits Gait & Station: normal Patient leans: N/A  Psychiatric Specialty Exam: Physical Exam  Nursing note and vitals reviewed. Constitutional: He is oriented to person, place, and time. He appears well-developed and well-nourished.  Respiratory: Effort normal.  Musculoskeletal: Normal range of motion.  Neurological: He is alert and oriented to person, place, and time.  Skin: Skin is warm.    Review of Systems  Constitutional: Negative.   HENT: Negative.   Eyes: Negative.   Respiratory: Negative.   Cardiovascular: Negative.   Gastrointestinal: Negative.   Genitourinary: Negative.   Musculoskeletal: Negative.   Skin: Negative.  Neurological: Negative.   Endo/Heme/Allergies: Negative.     Blood pressure 113/71, pulse (!) 101, temperature 98 F (36.7 C), temperature source Oral, resp. rate 18, height 6' (1.829 m), weight 67.1 kg (148 lb), SpO2 99 %.Body mass index is 20.07 kg/m.  General Appearance: Casual and Fairly Groomed  Eye Contact:  Good  Speech:  Clear and Coherent and Normal Rate  Volume:  Normal  Mood:  Euthymic  Affect:  Appropriate  Thought Process:  Coherent and Descriptions of Associations: Intact  Orientation:  Full (Time, Place, and Person)  Thought Content:  WDL  Suicidal Thoughts:  No   Homicidal Thoughts:  No  Memory:  Able to register three items. Recalled 2/3 spontaneously. Recalled the third when cued  Judgement:  Fair  Insight:  Good  Psychomotor Activity:  Normal  Concentration:  Concentration: Good  Recall:  Good  Fund of Knowledge:  Good  Language:  Good  Akathisia:  No  Handed:  Right  AIMS (if indicated):     Assets:  Desire for Improvement Financial Resources/Insurance Social Support  ADL's:  Intact  Cognition:  WNL  Sleep:  Number of Hours: 6     Have you used any form of tobacco in the last 30 days? (Cigarettes, Smokeless Tobacco, Cigars, and/or Pipes): Yes  Has this patient used any form of tobacco in the last 30 days? (Cigarettes, Smokeless Tobacco, Cigars, and/or Pipes) Yes, Yes, A prescription for an FDA-approved tobacco cessation medication was offered at discharge and the patient refused  Blood Alcohol level:  No results found for: [facility]  Metabolic Disorder Labs:  Lab Results  Component Value Date   HGBA1C 5.3 04/30/2017   MPG 105.41 04/30/2017   No results found for: PROLACTIN Lab Results  Component Value Date   CHOL 233 (H) 04/30/2017   TRIG 224 (H) 04/30/2017   HDL 46 04/30/2017   CHOLHDL 5.1 04/30/2017   VLDL 45 (H) 04/30/2017   LDLCALC 142 (H) 04/30/2017    See Psychiatric Specialty Exam and Suicide Risk Assessment completed by Attending Physician prior to discharge.  Discharge destination:  Home  Is patient on multiple antipsychotic therapies at discharge:  No   Has Patient had three or more failed trials of antipsychotic monotherapy by history:  No  Recommended Plan for Multiple Antipsychotic Therapies: NA   Allergies as of 05/06/2017   No Known Allergies     Medication List    STOP taking these medications   loratadine 10 MG tablet Commonly known as:  CLARITIN     TAKE these medications     Indication  ibuprofen 200 MG tablet Commonly known as:  ADVIL,MOTRIN Take 600-800 mg by mouth every 6 (six) hours  as needed (For headache, pain or gout.).  Indication:  Mild to Moderate Pain   indomethacin 50 MG capsule Commonly known as:  INDOCIN Take 50 mg by mouth 3 (three) times daily as needed (For gout.). What changed:  Another medication with the same name was added. Make sure you understand how and when to take each.  Indication:  Acute Joint Inflammation in Gout   indomethacin 50 MG capsule Commonly known as:  INDOCIN Take 1 capsule (50 mg total) by mouth 3 (three) times daily as needed (For gout.). What changed:  You were already taking a medication with the same name, and this prescription was added. Make sure you understand how and when to take each.  Indication:  Acute Joint Inflammation in Gout   mirtazapine 15 MG tablet  Commonly known as:  REMERON Take 1 tablet (15 mg total) by mouth at bedtime.  Indication:  Major Depressive Disorder   risperiDONE 2 MG tablet Commonly known as:  RISPERDAL Take 1 tablet (2 mg total) by mouth at bedtime.  Indication:  Mood stability      Follow-up Information    [facility] Follow up.   Specialty:  Behavioral Health Why:  Patient accepted Transitional Care Team, services to begin on day of discharge.  [address] [place] [address] [phone]           Follow-up recommendations:  Continue activity as tolerated. Continue diet as recommended by your PCP. Ensure to keep all appointments with outpatient providers.  Comments:  Patient is instructed prior to discharge to: Take all medications as prescribed by his/her mental healthcare provider. Report any adverse effects and or reactions from the medicines to his/her outpatient provider promptly. Patient has been instructed & cautioned: To not engage in alcohol and or illegal drug use while on prescription medicines. In the event of worsening symptoms, patient is instructed to call the crisis hotline, 911 and or go to the nearest ED for appropriate evaluation and  treatment of symptoms. To follow-up with his/her primary care provider for your other medical issues, concerns and or health care needs.    Signed: Gerlene Burdock Laurna Shetley, FNP 05/06/2017, 1:11 PM

## 2017-05-06 NOTE — Progress Notes (Signed)
Pt d/c from the hospital. All items returned. D/C instructions given and prescriptions given. Pt denies si and hi. 

## 2017-05-06 NOTE — BHH Suicide Risk Assessment (Signed)
[facility] Discharge Suicide Risk Assessment   Principal Problem: MDD (major depressive disorder), recurrent episode, severe (HCC) Discharge Diagnoses:  Patient Active Problem List   Diagnosis Date Noted  . MDD (major depressive disorder), recurrent episode, severe (HCC) [F33.2] 04/30/2017  . HTN (hypertension) [I10] 04/01/2016  . Homeless single person [Z59.0] 04/01/2016  . Tobacco abuse disorder [Z72.0] 04/01/2016    Total Time spent with patient: 45 minutes  Musculoskeletal: Strength & Muscle Tone: within normal limits Gait & Station: normal Patient leans: N/A  Psychiatric Specialty Exam: Review of Systems  Constitutional: Negative.   HENT: Negative.   Eyes: Negative.   Respiratory: Negative.   Cardiovascular: Negative.   Gastrointestinal: Negative.   Genitourinary: Negative.   Musculoskeletal: Negative.   Skin: Negative.   Neurological: Negative.   Endo/Heme/Allergies: Negative.   Psychiatric/Behavioral: Negative for depression, hallucinations, memory loss, substance abuse and suicidal ideas. The patient is not nervous/anxious and does not have insomnia.     Blood pressure 113/71, pulse (!) 101, temperature 98 F (36.7 C), temperature source Oral, resp. rate 18, height 6' (1.829 m), weight 67.1 kg (148 lb), SpO2 99 %.Body mass index is 20.07 kg/m.  General Appearance: Casually dressed, good relatedness, appropriate behavior.   Eye Contact::  Good  Speech:  Spontaneous, normal prosody. Normal tone and rate.   Volume:  Normal  Mood:  Euthymic  Affect:  Appropriate and Full Range  Thought Process:  Goal Directed and Linear  Orientation:  Full (Time, Place, and Person)  Thought Content:  Future oriented. No delusional theme. No preoccupation with violent thoughts. No negative ruminations. No obsession.  No hallucination in any modality.   Suicidal Thoughts:  No  Homicidal Thoughts:  No  Memory:  Able to register three items. Recalled 2/3 spontaneously. Recalled the third  when cued    Insight:  Good  Psychomotor Activity:  Normal  Concentration:  Good  Recall:  As above  Fund of Knowledge:Good  Language: Good  Akathisia:  Negative  Handed:    AIMS (if indicated):     Assets:  Communication Skills Desire for Improvement Financial Resources/Insurance Physical Health  Sleep:  Number of Hours: 6  Cognition: WNL  ADL's:  Intact   Clinical Assessment::   56 y.o.Caucasian male, single, has been living in the woods. History of Alcohol Use Disorder. Self presented for treatment. Reported suicidal thoughts at presentation. Routine labs were essentially normal.   Seen today. Pleased he came in to get resources. Says he is optimistic he would have his own place by christmas. He has a number he would call once he is out. Says he would stay with a friend for a while or go back to his tent. Showed a lot of pride in his ability to live out in the woods. Says he has done it for over ten years. His goal is to get into an [place] house by the end of the month. Patient reports good spirits. Says he is not depressed. He just wanted to get help with housing. He is not suicidal. He does not have access to weapons. Patient notes that he is able to think clearly. He cooperated with MMSE with me today. Scored 29/30. Dropped one point on delayed recall. No evidence of psychosis. No evidence of mania. No anxiety. No craving for substances. No thoughts of violence. No homicidal thoughts.   Nursing staff reports that patient has been appropriate on the unit. Patient has been interacting well with peers. No behavioral issues. Patient has not voiced  any suicidal thoughts. Patient has not been observed to be internally stimulated. Patient has been adherent with treatment recommendations. Patient has been tolerating their medication well.   Patient was discussed at team. Team members feels that patient is back to his baseline level of function. Team agrees with plan to discharge patient  today.  Demographic Factors:  Male, Caucasian, Low socioeconomic status and Living alone  Loss Factors: NA  Historical Factors: NA  Risk Reduction Factors:   Positive social support, Positive therapeutic relationship and Positive coping skills or problem solving skills  Continued Clinical Symptoms:  As above   Cognitive Features That Contribute To Risk:  None    Suicide Risk:  Minimal: No identifiable suicidal ideation.  Patient is not having any thoughts of suicide at this time. Modifiable risk factors targeted during this admission includes depression and adjustment disorder.  Demographical and historical risk factors cannot be modified. Patient is now engaging well. Patient is reliable and is future oriented. We have buffered patient's support structures. At this point, patient is at low risk of suicide. Patient is aware of the effects of psychoactive substances on decision making process. Patient has been provided with emergency contacts. Patient acknowledges to use resources provided if unforseen circumstances changes their current risk stratification.    Follow-up Information    Patient refuses all follow-up. Follow up.        [facility] Follow up.   Specialty:  Behavioral Health Why:  Patient accepted Transitional Care Team, services to begin on day of discharge.  Contact information: [address] [place] [address] [phone]           Plan Of Care/Follow-up recommendations:  1. Continue current psychotropic medications 2. Mental health and addiction follow up as arranged.  3. Provided limited quantity of prescriptions   Georgiann CockerVincent A Everton Bertha, MD 05/06/2017, 10:53 AM

## 2017-05-06 NOTE — Progress Notes (Signed)
  [facility]  Will you be returning to the same living situation after discharge:  Yes,  pt returning home. At discharge, do you have transportation home?: Yes,  pt has bus fare. Do you have the ability to pay for your medications: Yes,  pt has insurance.  Release of information consent forms completed and in the chart;  Patient's signature needed at discharge.  Patient to Follow up at: Follow-up Information    [facility] Follow up on 05/07/2017.   Specialty:  Behavioral Health Why:  Hospital discharge appointment 8/16 at 11:15am. Please bring a photo ID, insurance card, and your discharge paperwork with you to your appointment. You have also accepted Transitional Care Team, services to begin on day of discharge.  Contact information: [address] [place] [address] [phone]           Next level of care provider [facility] Link:no  Safety Planning and Suicide Prevention discussed: Yes,  with pt.  Have you used any form of tobacco in the last 30 days? (Cigarettes, Smokeless Tobacco, Cigars, and/or Pipes): Yes  Has patient been referred to the Quitline?: Patient refused referral  Patient has been referred for addiction treatment: Yes  Jonathon JordanLynn B Taci Sterling, MSW, LCSWA 05/06/2017, 1:15 PM

## 2017-10-11 ENCOUNTER — Encounter (HOSPITAL_COMMUNITY): Payer: Self-pay | Admitting: *Deleted

## 2017-10-11 ENCOUNTER — Emergency Department (HOSPITAL_COMMUNITY)
Admission: EM | Admit: 2017-10-11 | Discharge: 2017-10-11 | Disposition: A | Discharge status: Home / Self Care | Payer: Medicaid Other | Attending: Emergency Medicine | Admitting: Emergency Medicine

## 2017-10-11 ENCOUNTER — Emergency Department (HOSPITAL_COMMUNITY): Payer: Medicaid Other

## 2017-10-11 ENCOUNTER — Other Ambulatory Visit: Payer: Self-pay

## 2017-10-11 DIAGNOSIS — B9789 Other viral agents as the cause of diseases classified elsewhere: Secondary | ICD-10-CM

## 2017-10-11 DIAGNOSIS — Z79899 Other long term (current) drug therapy: Secondary | ICD-10-CM | POA: Insufficient documentation

## 2017-10-11 DIAGNOSIS — J029 Acute pharyngitis, unspecified: Secondary | ICD-10-CM | POA: Diagnosis present

## 2017-10-11 DIAGNOSIS — I10 Essential (primary) hypertension: Secondary | ICD-10-CM | POA: Diagnosis not present

## 2017-10-11 DIAGNOSIS — J069 Acute upper respiratory infection, unspecified: Secondary | ICD-10-CM | POA: Diagnosis not present

## 2017-10-11 DIAGNOSIS — F17210 Nicotine dependence, cigarettes, uncomplicated: Secondary | ICD-10-CM | POA: Insufficient documentation

## 2017-10-11 DIAGNOSIS — E119 Type 2 diabetes mellitus without complications: Secondary | ICD-10-CM | POA: Insufficient documentation

## 2017-10-11 LAB — RAPID STREP SCREEN (MED CTR MEBANE ONLY): Streptococcus, Group A Screen (Direct): NEGATIVE

## 2017-10-11 MED ORDER — BENZONATATE 100 MG PO CAPS
100.0000 mg | ORAL_CAPSULE | Freq: Once | ORAL | Status: AC
  Administered 2017-10-11: 100 mg via ORAL
  Filled 2017-10-11: qty 1

## 2017-10-11 MED ORDER — BENZONATATE 100 MG PO CAPS: 100.0000 mg | ORAL_CAPSULE | Freq: Three times a day (TID) | ORAL | 0 refills | Status: DC

## 2017-10-11 MED ORDER — ACETAMINOPHEN 325 MG PO TABS
650.0000 mg | ORAL_TABLET | Freq: Once | ORAL | Status: AC
  Administered 2017-10-11: 650 mg via ORAL
  Filled 2017-10-11: qty 2

## 2017-10-11 NOTE — ED Notes (Signed)
Patient transported to X-ray 

## 2017-10-11 NOTE — ED Notes (Signed)
Pt attempted to sign for d/c paperwork but signature pad not working.

## 2017-10-11 NOTE — ED Provider Notes (Signed)
[name] Provider Note   CSN: [id number] Arrival date & time: 10/11/17  1004     History   Chief Complaint Chief Complaint  Patient presents with  . Sore Throat    HPI [name] is a 57 y.o. male.  HPI   57 year old male presents today with complaints of upper respiratory infection.  Patient notes that for the last 4 days he has had cough, sore throat, nasal congestion and fatigue.  Patient reports fever, notes taking ibuprofen early morning hours of today.  He denies any acute shortness of breath, reports nonproductive cough.  He denies any other acute concerns here today.  Patient reports no pulmonary history but notes he is a smoker.   Past Medical History:  Diagnosis Date  . Alcoholic (HCC)   . Bipolar 1 disorder (HCC)   . Depression   . Diabetes mellitus without complication (HCC)   . Schizoaffective schizophrenia [facility](HCC)     Patient Active Problem List   Diagnosis Date Noted  . MDD (major depressive disorder), recurrent episode, severe (HCC) 04/30/2017  . HTN (hypertension) 04/01/2016  . Homeless single person 04/01/2016  . Tobacco abuse disorder 04/01/2016    Past Surgical History:  Procedure Laterality Date  . DENTAL SURGERY     wisdom teeth       Home Medications    Prior to Admission medications   Medication Sig Start Date End Date Taking? Authorizing Provider  benzonatate (TESSALON) 100 MG capsule Take 1 capsule (100 mg total) by mouth every 8 (eight) hours. 10/11/17   Kimbery Harwood, Tinnie GensJeffrey, PA-C  ibuprofen (ADVIL,MOTRIN) 200 MG tablet Take 600-800 mg by mouth every 6 (six) hours as needed (For headache, pain or gout.).    [provider]  indomethacin (INDOCIN) 50 MG capsule Take 50 mg by mouth 3 (three) times daily as needed (For gout.).    [provider]  indomethacin (INDOCIN) 50 MG capsule Take 1 capsule (50 mg total) by mouth 3 (three) times daily as needed (For gout.). 05/06/17    Money, Gerlene Burdockravis B, FNP  mirtazapine (REMERON) 15 MG tablet Take 1 tablet (15 mg total) by mouth at bedtime. 05/06/17   Money, Gerlene Burdockravis B, FNP  risperiDONE (RISPERDAL) 2 MG tablet Take 1 tablet (2 mg total) by mouth at bedtime. 05/06/17   Money, Gerlene Burdockravis B, FNP    Family History History reviewed. No pertinent family history.  Social History Social History   Tobacco Use  . Smoking status: Current Every Day Smoker    Packs/day: 2.00    Types: Cigarettes  . Smokeless tobacco: Never Used  Substance Use Topics  . Alcohol use: Yes  . Drug use: No     Allergies   Patient has no known allergies.   Review of Systems Review of Systems  All other systems reviewed and are negative.    Physical Exam Updated Vital Signs BP 115/60 (BP Location: Right Arm)   Pulse 100   Temp 98.9 F (37.2 C) (Oral)   Resp 12   Ht 6' (1.829 m)   Wt 61.2 kg (135 lb)   SpO2 100%   BMI 18.31 kg/m   Physical Exam  Constitutional: He is oriented to person, place, and time. He appears well-developed and well-nourished.  HENT:  Head: Normocephalic and atraumatic.  Mouth/Throat: Uvula is midline, oropharynx is clear and moist and mucous membranes are normal. No oral lesions. No dental abscesses or uvula swelling. No oropharyngeal exudate, posterior oropharyngeal edema or posterior  oropharyngeal erythema. Tonsils are 0 on the right. Tonsils are 0 on the left. No tonsillar exudate.  Eyes: Conjunctivae are normal. Pupils are equal, round, and reactive to light. Right eye exhibits no discharge. Left eye exhibits no discharge. No scleral icterus.  Neck: Normal range of motion. No JVD present. No tracheal deviation present.  Pulmonary/Chest: Effort normal. No stridor.  Neurological: He is alert and oriented to person, place, and time. Coordination normal.  Psychiatric: He has a normal mood and affect. His behavior is normal. Judgment and thought content normal.  Nursing note and vitals reviewed.    ED Treatments /  Results  Labs (all labs ordered are listed, but only abnormal results are displayed) Labs Reviewed  RAPID STREP SCREEN (NOT AT [facility])  CULTURE, GROUP A STREP [facility])    EKG  EKG Interpretation None       Radiology Dg Chest 2 View  Result Date: 10/11/2017 CLINICAL DATA:  57 year old male with sore throat for several days and cough. EXAM: CHEST  2 VIEW COMPARISON:  03/17/2016 and earlier. FINDINGS: Mildly lower lung volumes. Mediastinal contours remain normal. Visualized tracheal air column is within normal limits. No pneumothorax, pulmonary edema, pleural effusion or acute pulmonary opacity. Chronic postoperative changes to the left shoulder, and chronic left lateral 8th and 9th rib fractures are again noted. No acute osseous abnormality identified. Negative visible bowel gas pattern. IMPRESSION: No acute cardiopulmonary abnormality. Electronically Signed   By: Odessa Fleming M.D.   On: 10/11/2017 13:55    Procedures Procedures (including critical care time)  Medications Ordered in ED Medications  acetaminophen (TYLENOL) tablet 650 mg (650 mg Oral Given 10/11/17 1329)  benzonatate (TESSALON) capsule 100 mg (100 mg Oral Given 10/11/17 1444)     Initial Impression / Assessment and Plan / ED Course  I have reviewed the triage vital signs and the nursing notes.  Pertinent labs & imaging results that were available during my care of the patient were reviewed by me and considered in my medical decision making (see chart for details).      Final Clinical Impressions(s) / ED Diagnoses   Final diagnoses:  Viral URI with cough   Labs: rapid strep  Imaging: DG chest 2 view  Consults:  Therapeutics: Tessalon, Tylenol  Discharge Meds: Tessalon  Assessment/Plan: 57 year old male presents today with likely viral URI.  He is well-appearing in no acute distress.  This was going on for several days, he does not have a fever.  I have lower suspicion for acute influenza in him.  Patient has no  objective findings on physical exam other than minor tachycardia.  Patient will be given cough medication, Tylenol, encouraged follow-up with primary care return immediately with any new or worsening signs or symptoms.  Verbalized understanding and      ED Discharge Orders        Ordered    benzonatate (TESSALON) 100 MG capsule  Every 8 hours     10/11/17 1420       Eyvonne Mechanic, PA-C 10/11/17 1703    Rolland Porter, MD 10/11/17 2147

## 2017-10-11 NOTE — ED Triage Notes (Signed)
[facility]. Pt having sore throat for several days and he states that "it is choking him." no redness or swelling noted to throat.

## 2017-10-11 NOTE — Discharge Instructions (Signed)
Please read attached information. If you experience any new or worsening signs or symptoms please return to the emergency room for evaluation. Please follow-up with your primary care provider or specialist as discussed. Please use medication prescribed only as directed and discontinue taking if you have any concerning signs or symptoms.   °

## 2017-10-14 LAB — CULTURE, GROUP A STREP (THRC)

## 2017-10-16 ENCOUNTER — Encounter (HOSPITAL_COMMUNITY): Payer: Self-pay | Admitting: Emergency Medicine

## 2017-10-16 ENCOUNTER — Other Ambulatory Visit: Payer: Self-pay

## 2017-10-16 ENCOUNTER — Emergency Department (HOSPITAL_COMMUNITY): Payer: Medicaid Other

## 2017-10-16 ENCOUNTER — Emergency Department (HOSPITAL_COMMUNITY)
Admission: EM | Admit: 2017-10-16 | Discharge: 2017-10-16 | Disposition: A | Discharge status: Home / Self Care | Payer: Medicaid Other | Attending: Emergency Medicine | Admitting: Emergency Medicine

## 2017-10-16 DIAGNOSIS — F17210 Nicotine dependence, cigarettes, uncomplicated: Secondary | ICD-10-CM | POA: Diagnosis not present

## 2017-10-16 DIAGNOSIS — E119 Type 2 diabetes mellitus without complications: Secondary | ICD-10-CM | POA: Diagnosis not present

## 2017-10-16 DIAGNOSIS — I10 Essential (primary) hypertension: Secondary | ICD-10-CM | POA: Diagnosis not present

## 2017-10-16 DIAGNOSIS — R0602 Shortness of breath: Secondary | ICD-10-CM | POA: Diagnosis present

## 2017-10-16 DIAGNOSIS — J189 Pneumonia, unspecified organism: Secondary | ICD-10-CM | POA: Insufficient documentation

## 2017-10-16 LAB — BASIC METABOLIC PANEL
Anion gap: 12 (ref 5–15)
BUN: 10 mg/dL (ref 6–20)
CALCIUM: 8.8 mg/dL — AB (ref 8.9–10.3)
CO2: 25 mmol/L (ref 22–32)
CREATININE: 1.02 mg/dL (ref 0.61–1.24)
Chloride: 101 mmol/L (ref 101–111)
GFR calc Af Amer: >60 mL/min (ref >60)
Glucose, Bld: 95 mg/dL (ref 65–99)
POTASSIUM: 3.6 mmol/L (ref 3.5–5.1)
SODIUM: 138 mmol/L (ref 135–145)

## 2017-10-16 LAB — CBC
HEMATOCRIT: 41.5 % (ref 39.0–52.0)
Hemoglobin: 14.2 g/dL (ref 13.0–17.0)
MCH: 32 pg (ref 26.0–34.0)
MCHC: 34.2 g/dL (ref 30.0–36.0)
MCV: 93.5 fL (ref 78.0–100.0)
PLATELETS: 185 10*3/uL (ref 150–400)
RBC: 4.44 MIL/uL (ref 4.22–5.81)
RDW: 13.9 % (ref 11.5–15.5)
WBC: 12.3 10*3/uL — AB (ref 4.0–10.5)

## 2017-10-16 LAB — I-STAT TROPONIN, ED: TROPONIN I, POC: 0 ng/mL (ref 0.00–0.08)

## 2017-10-16 LAB — D-DIMER, QUANTITATIVE (NOT AT ARMC): D DIMER QUANT: 1.04 ug{FEU}/mL — AB (ref 0.00–0.50)

## 2017-10-16 MED ORDER — IOPAMIDOL (ISOVUE-370) INJECTION 76%
INTRAVENOUS | Status: AC
  Administered 2017-10-16: 100 mL
  Filled 2017-10-16: qty 100

## 2017-10-16 MED ORDER — SODIUM CHLORIDE 0.9 % IV BOLUS (SEPSIS)
2000.0000 mL | Freq: Once | INTRAVENOUS | Status: AC
  Administered 2017-10-16: 2000 mL via INTRAVENOUS

## 2017-10-16 MED ORDER — AZITHROMYCIN 250 MG PO TABS: 250.0000 mg | ORAL_TABLET | Freq: Every day | ORAL | 0 refills | Status: DC

## 2017-10-16 MED ORDER — DEXAMETHASONE SODIUM PHOSPHATE 10 MG/ML IJ SOLN
10.0000 mg | Freq: Once | INTRAMUSCULAR | Status: AC
  Administered 2017-10-16: 10 mg via INTRAVENOUS
  Filled 2017-10-16: qty 1

## 2017-10-16 MED ORDER — HYDROCODONE-HOMATROPINE 5-1.5 MG/5ML PO SYRP
5.0000 mL | ORAL_SOLUTION | Freq: Once | ORAL | Status: AC
  Administered 2017-10-16: 5 mL via ORAL
  Filled 2017-10-16: qty 5

## 2017-10-16 MED ORDER — AZITHROMYCIN 250 MG PO TABS
500.0000 mg | ORAL_TABLET | Freq: Once | ORAL | Status: AC
  Administered 2017-10-16: 500 mg via ORAL
  Filled 2017-10-16: qty 2

## 2017-10-16 MED ORDER — IPRATROPIUM-ALBUTEROL 0.5-2.5 (3) MG/3ML IN SOLN
3.0000 mL | RESPIRATORY_TRACT | Status: DC
  Administered 2017-10-16: 3 mL via RESPIRATORY_TRACT
  Filled 2017-10-16: qty 3

## 2017-10-16 MED ORDER — AZITHROMYCIN 250 MG PO TABS
250.0000 mg | ORAL_TABLET | Freq: Every day | ORAL | Status: DC
  Administered 2017-10-16: 250 mg via ORAL
  Filled 2017-10-16: qty 4

## 2017-10-16 NOTE — ED Notes (Signed)
Patient transported to CT 

## 2017-10-16 NOTE — ED Notes (Signed)
Pt ambulated while on Pulse Ox, O2 sats remained 92-93 range with a good waveform and HR 98-102 throughout the walk. Pt had strong and steady gait but stated he still felt "weak".

## 2017-10-16 NOTE — ED Provider Notes (Signed)
Emergency Department Provider Note   I have reviewed the triage vital signs and the nursing notes.   HISTORY  Chief Complaint Chest Pain and Shortness of Breath   HPI [name] is a 57 y.o. male smoker, alcoholic with a history of diabetes, depression and schizoaffective disorder who was home at the presents the emergency department persistent chest pain, cough and shortness of breath.  Patient states that he was seen here a few days ago and was given some coughing pills that he did not have the money to get filled and his cough got worse since that time.  He had decreased appetite decreased energy and abdominal pain whenever he coughs.  Patient states he has had some chills as well but no measured fevers.  He is homeless and lives in a shelter and he is made to leave every day and he thinks that contributed to his problem.  Has not smoked since this started.  Does not have any other medications at home.  No history of illnesses. No recent travels or incarcerations  No other associated or modifying symptoms.    Past Medical History:  Diagnosis Date  . Alcoholic (HCC)   . Bipolar 1 disorder (HCC)   . Depression   . Diabetes mellitus without complication (HCC)   . Schizoaffective schizophrenia [facility])     Patient Active Problem List   Diagnosis Date Noted  . MDD (major depressive disorder), recurrent episode, severe (HCC) 04/30/2017  . HTN (hypertension) 04/01/2016  . Homeless single person 04/01/2016  . Tobacco abuse disorder 04/01/2016    Past Surgical History:  Procedure Laterality Date  . DENTAL SURGERY     wisdom teeth    Current Outpatient Rx  . Order #: [id number] Class: Print    Allergies Patient has no known allergies.  No family history on file.  Social History Social History   Tobacco Use  . Smoking status: Current Every Day Smoker    Packs/day: 2.00    Types: Cigarettes  . Smokeless tobacco: Never Used  Substance Use Topics  . Alcohol  use: Yes  . Drug use: No    Review of Systems  All other systems negative except as documented in the HPI. All pertinent positives and negatives as reviewed in the HPI. ____________________________________________   PHYSICAL EXAM:  VITAL SIGNS: ED Triage Vitals  Enc Vitals Group     BP 10/16/17 1253 135/80     Pulse Rate 10/16/17 1253 94     Resp 10/16/17 1253 18     Temp 10/16/17 1253 98.6 F (37 C)     Temp Source 10/16/17 1253 Oral     SpO2 10/16/17 1253 98 %    Constitutional: Alert and oriented. Ill appearing but in no acute distress. Eyes: Conjunctivae are normal. PERRL. EOMI. Head: Atraumatic. Nose: No congestion/rhinnorhea. Mouth/Throat: Mucous membranes are moist.  Oropharynx non-erythematous. Neck: No stridor.  No meningeal signs.   Cardiovascular: Normal rate, regular rhythm. Good peripheral circulation. Grossly normal heart sounds.   Respiratory: Normal respiratory effort.  No retractions. Lungs with LLL crackles. Gastrointestinal: Soft and nontender. No distention.  Musculoskeletal: No lower extremity tenderness nor edema. No gross deformities of extremities. Neurologic:  Normal speech and language. No gross focal neurologic deficits are appreciated.  Skin:  Skin is warm, dry and intact. No rash noted.   ____________________________________________   LABS (all labs ordered are listed, but only abnormal results are displayed)  Labs Reviewed  BASIC METABOLIC PANEL - Abnormal; Notable for the  following components:      Result Value   Calcium 8.8 (*)    All other components within normal limits  CBC - Abnormal; Notable for the following components:   WBC 12.3 (*)    All other components within normal limits  D-DIMER, QUANTITATIVE (NOT AT [facility]) - Abnormal; Notable for the following components:   D-Dimer, Quant 1.04 (*)    All other components within normal limits  I-STAT TROPONIN, ED   ____________________________________________  EKG   EKG  Interpretation  Date/Time:  Friday October 16 2017 12:49:02 EST Ventricular Rate:  96 PR Interval:  134 QRS Duration: 84 QT Interval:  348 QTC Calculation: 439 R Axis:   67 Text Interpretation:  Normal sinus rhythm Normal ECG No significant change since last tracing Confirmed by Marily MemosMesner, Bronson Bressman [phone]([id number]) on 10/16/2017 4:45:32 PM       ____________________________________________  RADIOLOGY  Dg Chest 2 View  Result Date: 10/16/2017 CLINICAL DATA:  Shortness of breath.  Chest pain. EXAM: CHEST  2 VIEW COMPARISON:  10/11/2017. FINDINGS: Mediastinum hilar structures normal. Heart size normal. COPD. Mild lingular infiltrate. No pleural effusion or pneumothorax. Biapical pleural thickening consistent scarring. Old left posterior ninth rib fracture. IMPRESSION: COPD.  Mild lingular infiltrate. Electronically Signed   By: Maisie Fushomas  Register   On: 10/16/2017 13:25   Ct Angio Chest Pe W And/or Wo Contrast  Result Date: 10/16/2017 CLINICAL DATA:  Dyspnea and chest pain.  Positive D-dimer. EXAM: CT ANGIOGRAPHY CHEST WITH CONTRAST TECHNIQUE: Multidetector CT imaging of the chest was performed using the standard protocol during bolus administration of intravenous contrast. Multiplanar CT image reconstructions and MIPs were obtained to evaluate the vascular anatomy. CONTRAST:  100mL ISOVUE-370 IOPAMIDOL (ISOVUE-370) INJECTION 76% COMPARISON:  CXR 10/16/2017 FINDINGS: Cardiovascular: Normal branch pattern of the great vessels with moderate atherosclerosis at the origin of the left subclavian artery. Aortic atherosclerosis without aneurysm. Good perfusion of the pulmonary arterial system without acute pulmonary embolus. Heart size is normal without pericardial effusion. Coronary arteriosclerosis is noted along the LAD and circumflex. Mediastinum/Nodes: No mediastinal adenopathy. Patent midline trachea. Patent main stem bronchi. Unremarkable CT appearance of the esophagus. Lungs/Pleura: Patchy ground-glass  infiltrates in the right upper lobe, right middle lobe, lingula and both lower lobes with subsegmental atelectasis and/or scarring in the periphery of the right lower lobe. No effusion or pneumothorax. Mild peribronchial thickening is seen in wall. Paraseptal emphysema is seen in the upper lobes. Upper Abdomen: No acute abnormality. Musculoskeletal: Lower cervical spondylosis with osteophytes and disc space narrowing. No acute osseous abnormality. A chronic left ninth posterior rib fracture Review of the MIP images confirms the above findings. IMPRESSION: 1. No acute pulmonary embolus. 2. Aortic atherosclerosis. 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 3. Coronary arteriosclerosis. 4. Faint ground-glass infiltrates in the right upper, right middle, both lower lobes and lingula. This in conjunction with peribronchial thickening may reflect stigmata of atypical or viral pneumonia, pneumonitis or alveolitis among some considerations though not exclusive. Aortic Atherosclerosis (ICD10-I70.0) and Emphysema (ICD10-J43.9). Electronically Signed   By: Tollie Ethavid  Kwon M.D.   On: 10/16/2017 21:30    ____________________________________________   PROCEDURES  Procedure(s) performed:   Procedures   ____________________________________________   INITIAL IMPRESSION / ASSESSMENT AND PLAN / ED COURSE  Chest x-ray with evidence of a lingular pneumonia which fits his clinical picture as well.  His heart rates up a little bit and his O2 sat a bit low.  Suspect this probably is a chronic smoker however would also be at risk for PE so will give some fluids and antibiotics and reevaluate. Abdominal and chest pain is likely secondary to coughing, doubt ACS.   Patient with CAP. Care management got his medications for him, will dc on azithromycin. VS significantly improved. Will rest at shelter. Stable for dc otherwise.    Pertinent labs & imaging results that were available during my care of the patient were reviewed by me and considered in my medical decision making (see chart for details).  ____________________________________________  FINAL CLINICAL IMPRESSION(S) / ED DIAGNOSES  Final diagnoses:  Community acquired pneumonia of right lung, unspecified part of lung     MEDICATIONS GIVEN DURING THIS VISIT:  Medications  ipratropium-albuterol (DUONEB) 0.5-2.5 (3) MG/3ML nebulizer solution 3 mL (3 mLs Nebulization Not Given 10/16/17 2111)  azithromycin (ZITHROMAX) tablet 250 mg (not administered)  sodium chloride 0.9 % bolus 2,000 mL (0 mLs Intravenous Stopped 10/16/17 2032)  HYDROcodone-homatropine (HYCODAN) 5-1.5 MG/5ML syrup 5 mL (5 mLs Oral Given 10/16/17 1826)  dexamethasone (DECADRON) injection 10 mg (10 mg Intravenous Given 10/16/17 1748)  azithromycin (ZITHROMAX) tablet 500 mg (500 mg Oral Given 10/16/17 1826)  iopamidol (ISOVUE-370) 76 % injection (100 mLs  Contrast Given 10/16/17 2102)     NEW OUTPATIENT MEDICATIONS STARTED DURING THIS VISIT:  New Prescriptions   AZITHROMYCIN (ZITHROMAX) 250 MG TABLET    Take 1 tablet (250 mg total) by mouth daily. Take 1 every day until finished.    Note:  This note was prepared with assistance of Dragon voice recognition software. Occasional wrong-word or sound-a-like substitutions may have occurred due to the inherent limitations of voice recognition software.   Marily Memos, MD 10/16/17 2235

## 2017-10-16 NOTE — Care Management (Signed)
ED CM consulted concerning medication assistance by EDP. Anticipate patient being discharged on antibiotic therapy for PNA patient is homeless staying at [facility]. Patient does not have the funds nor transportation to local pharmacy. [facility], who will provide discharge abx to patient prior to discharge from the ED. CM  [facility]  CM updated Dr. Clayborne DanaMesner and Jill AlexandersJustin RN [place] [facility].

## 2017-10-16 NOTE — ED Notes (Signed)
MD talked to patient concerning his inability to talk with RN. Patient still refused to answer questions concerning his pain and symptoms.

## 2017-10-16 NOTE — ED Notes (Signed)
Patient refuses to talk with me at this time. No reason given.

## 2017-10-16 NOTE — ED Triage Notes (Signed)
Pt to ED c/o SOB, central, non-radiating CP x 1 week and productive cough and congestion x 2 days. Patient denies fevers/chills. States he's at a shelter right now and needs a note for bed rest. Also reports abdominal pain with coughing. Denies dizziness, no N/V/D. Resp e/u, skin warm/dry.

## 2017-10-16 NOTE — ED Notes (Signed)
Patient verbalized understanding of discharge instructions and denies any further needs or questions at this time. VS stable. Provided him with [place] sandwich and coke. Patient ambulatory with steady gait.

## 2017-10-19 ENCOUNTER — Emergency Department (HOSPITAL_COMMUNITY)
Admission: EM | Admit: 2017-10-19 | Discharge: 2017-10-19 | Disposition: A | Discharge status: Home / Self Care | Payer: Medicaid Other | Attending: Emergency Medicine | Admitting: Emergency Medicine

## 2017-10-19 ENCOUNTER — Emergency Department (HOSPITAL_COMMUNITY): Payer: Medicaid Other

## 2017-10-19 ENCOUNTER — Encounter (HOSPITAL_COMMUNITY): Payer: Self-pay

## 2017-10-19 DIAGNOSIS — R05 Cough: Secondary | ICD-10-CM | POA: Diagnosis present

## 2017-10-19 DIAGNOSIS — R059 Cough, unspecified: Secondary | ICD-10-CM

## 2017-10-19 DIAGNOSIS — I10 Essential (primary) hypertension: Secondary | ICD-10-CM | POA: Insufficient documentation

## 2017-10-19 DIAGNOSIS — F17210 Nicotine dependence, cigarettes, uncomplicated: Secondary | ICD-10-CM | POA: Diagnosis not present

## 2017-10-19 DIAGNOSIS — E119 Type 2 diabetes mellitus without complications: Secondary | ICD-10-CM | POA: Insufficient documentation

## 2017-10-19 MED ORDER — IPRATROPIUM-ALBUTEROL 0.5-2.5 (3) MG/3ML IN SOLN
3.0000 mL | Freq: Once | RESPIRATORY_TRACT | Status: AC
  Administered 2017-10-19: 3 mL via RESPIRATORY_TRACT
  Filled 2017-10-19: qty 3

## 2017-10-19 NOTE — ED Notes (Signed)
Dr. Rodena MedinMessick at bedside speaking with pt

## 2017-10-19 NOTE — ED Triage Notes (Signed)
Patient started antibiotics on 1/25 for pneumonia and request further evaluation. States that he has ongoing cough and congestion and unable to rest at night. VSS, NAD

## 2017-10-19 NOTE — ED Provider Notes (Signed)
[facility] Provider Note   CSN: [id number] Arrival date & time: 10/19/17  [id number]     History   Chief Complaint Chief Complaint  Patient presents with  . recheck pneumonia    HPI [name] is a 57 y.o. male.  57 year old male with prior history of bipolar disorder, depression, diabetes, and schizoaffective disorder who presents for reevaluation following a recent diagnosis of pneumonia.  Patient reports that he was here at this facility last week and diagnosed with pneumonia.  He was started on a Z-Pak.  He reports that he has been taking this and he has 1 more day of antibiotics left.  He reports continued cough.  He otherwise feels somewhat improved.  He denies fever.  He requests a repeat x-ray to make sure that he is "getting better".   The history is provided by the patient.  Cough  This is a new problem. The current episode started more than 2 days ago. The problem occurs every few minutes. The problem has been gradually improving. The cough is non-productive. There has been no fever. He has tried nothing for the symptoms. The treatment provided no relief.    Past Medical History:  Diagnosis Date  . Alcoholic (HCC)   . Bipolar 1 disorder (HCC)   . Depression   . Diabetes mellitus without complication (HCC)   . Schizoaffective schizophrenia [facility](HCC)     Patient Active Problem List   Diagnosis Date Noted  . MDD (major depressive disorder), recurrent episode, severe (HCC) 04/30/2017  . HTN (hypertension) 04/01/2016  . Homeless single person 04/01/2016  . Tobacco abuse disorder 04/01/2016    Past Surgical History:  Procedure Laterality Date  . DENTAL SURGERY     wisdom teeth       Home Medications    Prior to Admission medications   Medication Sig Start Date End Date Taking? Authorizing Provider  azithromycin (ZITHROMAX) 250 MG tablet Take 1 tablet (250 mg total) by mouth daily. Take 1 every day until finished.  10/16/17   Mesner, Barbara CowerJason, MD    Family History No family history on file.  Social History Social History   Tobacco Use  . Smoking status: Current Every Day Smoker    Packs/day: 2.00    Types: Cigarettes  . Smokeless tobacco: Never Used  Substance Use Topics  . Alcohol use: Yes  . Drug use: No     Allergies   Patient has no known allergies.   Review of Systems Review of Systems  Respiratory: Positive for cough.   All other systems reviewed and are negative.    Physical Exam Updated Vital Signs BP 133/86 (BP Location: Right Arm)   Pulse 83   Temp 98.4 F (36.9 C) (Oral)   Resp 16   SpO2 96%   Physical Exam  Constitutional: He is oriented to person, place, and time. He appears well-developed and well-nourished. No distress.  HENT:  Head: Normocephalic and atraumatic.  Mouth/Throat: Oropharynx is clear and moist.  Eyes: Conjunctivae and EOM are normal. Pupils are equal, round, and reactive to light.  Neck: Normal range of motion. Neck supple.  Cardiovascular: Normal rate, regular rhythm and normal heart sounds.  Pulmonary/Chest: Effort normal and breath sounds normal. No respiratory distress.  Abdominal: Soft. He exhibits no distension. There is no tenderness.  Musculoskeletal: Normal range of motion. He exhibits no edema or deformity.  Neurological: He is alert and oriented to person, place, and time.  Skin: Skin is warm  and dry.  Psychiatric: He has a normal mood and affect.  Nursing note and vitals reviewed.    ED Treatments / Results  Labs (all labs ordered are listed, but only abnormal results are displayed) Labs Reviewed - No data to display  EKG  EKG Interpretation  Date/Time:  Monday October 19 2017 09:05:27 EST Ventricular Rate:  83 PR Interval:  134 QRS Duration: 82 QT Interval:  382 QTC Calculation: 448 R Axis:   73 Text Interpretation:  Normal sinus rhythm Normal ECG Confirmed by Kristine Royal [phone]) on 10/19/2017 2:50:45 PM        Radiology Dg Chest 2 View  Result Date: 10/19/2017 CLINICAL DATA:  Shortness of breath, dizziness and cough EXAM: CHEST  2 VIEW COMPARISON:  10/16/2017 FINDINGS: Normal heart size. There is no pleural effusion or edema. No airspace opacities identified. Orthopedic screw noted within the left glenoid. IMPRESSION: 1. No acute cardiopulmonary abnormality. Electronically Signed   By: Signa Kell M.D.   On: 10/19/2017 16:11    Procedures Procedures (including critical care time)  Medications Ordered in ED Medications  ipratropium-albuterol (DUONEB) 0.5-2.5 (3) MG/3ML nebulizer solution 3 mL (3 mLs Nebulization Given 10/19/17 1535)     Initial Impression / Assessment and Plan / ED Course  I have reviewed the triage vital signs and the nursing notes.  Pertinent labs & imaging results that were available during my care of the patient were reviewed by me and considered in my medical decision making (see chart for details).     MDM screen complete  Patient with diagnosis of community-acquired pneumonia.  Patient has nearly completed a Z-Pak (one dose left).   Patient is presenting requesting a repeat x-ray to confirm that he is actually getting better.  X-ray does not show persistent infiltrate.  Patient appears to be clinically improved.  Close follow-up is advised.  Strict return precautions given and understood.  Final Clinical Impressions(s) / ED Diagnoses   Final diagnoses:  Cough    ED Discharge Orders    None       Wynetta Fines, MD 10/19/17 [phone]

## 2017-10-19 NOTE — ED Notes (Signed)
Pt stable and ambulatory for d/c, states understanding d/c instructions

## 2017-11-03 ENCOUNTER — Encounter: Payer: Self-pay | Admitting: Pediatric Intensive Care

## 2017-11-11 ENCOUNTER — Emergency Department (HOSPITAL_COMMUNITY): Payer: Medicaid Other

## 2017-11-11 ENCOUNTER — Encounter (HOSPITAL_COMMUNITY): Payer: Self-pay | Admitting: Emergency Medicine

## 2017-11-11 ENCOUNTER — Emergency Department (HOSPITAL_COMMUNITY)
Admission: EM | Admit: 2017-11-11 | Discharge: 2017-11-11 | Disposition: A | Discharge status: Home / Self Care | Payer: Medicaid Other | Attending: Emergency Medicine | Admitting: Emergency Medicine

## 2017-11-11 ENCOUNTER — Other Ambulatory Visit: Payer: Self-pay

## 2017-11-11 DIAGNOSIS — E119 Type 2 diabetes mellitus without complications: Secondary | ICD-10-CM | POA: Diagnosis not present

## 2017-11-11 DIAGNOSIS — R062 Wheezing: Secondary | ICD-10-CM | POA: Diagnosis not present

## 2017-11-11 DIAGNOSIS — R0981 Nasal congestion: Secondary | ICD-10-CM | POA: Diagnosis not present

## 2017-11-11 DIAGNOSIS — I10 Essential (primary) hypertension: Secondary | ICD-10-CM | POA: Diagnosis not present

## 2017-11-11 DIAGNOSIS — J069 Acute upper respiratory infection, unspecified: Secondary | ICD-10-CM

## 2017-11-11 DIAGNOSIS — B9789 Other viral agents as the cause of diseases classified elsewhere: Secondary | ICD-10-CM | POA: Insufficient documentation

## 2017-11-11 DIAGNOSIS — J3489 Other specified disorders of nose and nasal sinuses: Secondary | ICD-10-CM | POA: Insufficient documentation

## 2017-11-11 DIAGNOSIS — F17210 Nicotine dependence, cigarettes, uncomplicated: Secondary | ICD-10-CM | POA: Diagnosis not present

## 2017-11-11 DIAGNOSIS — R05 Cough: Secondary | ICD-10-CM | POA: Diagnosis present

## 2017-11-11 MED ORDER — CETIRIZINE-PSEUDOEPHEDRINE ER 5-120 MG PO TB12: 1.0000 | ORAL_TABLET | Freq: Two times a day (BID) | ORAL | 0 refills | Status: DC | PRN

## 2017-11-11 MED ORDER — ALBUTEROL SULFATE (2.5 MG/3ML) 0.083% IN NEBU: 5.0000 mg | INHALATION_SOLUTION | Freq: Once | RESPIRATORY_TRACT | Status: DC

## 2017-11-11 MED ORDER — IPRATROPIUM BROMIDE 0.02 % IN SOLN
0.5000 mg | Freq: Once | RESPIRATORY_TRACT | Status: DC
Start: 2017-11-11 — End: 2017-11-11

## 2017-11-11 MED ORDER — ALBUTEROL SULFATE HFA 108 (90 BASE) MCG/ACT IN AERS: 2.0000 | INHALATION_SPRAY | RESPIRATORY_TRACT | 1 refills | Status: DC | PRN

## 2017-11-11 MED ORDER — PREDNISONE 20 MG PO TABS: 60.0000 mg | ORAL_TABLET | Freq: Every day | ORAL | 0 refills | Status: DC

## 2017-11-11 NOTE — ED Triage Notes (Signed)
Pt to ED with c/o productive cough.  Pt was dx with pneumonia in Jan.  Pt st's he finished his antibiotics but is not feeling any better.

## 2017-11-11 NOTE — Discharge Instructions (Signed)
It was our pleasure to provide your ER care today - we hope that you feel better.  Today's xray looks good - no pneumonia.    Take prednisone as prescribed.   Use albuterol inhaler as need.   For nasal/sinus congestion - take claritin-d or zyrtec-d as need for congestion.  Avoid any smoking.   Follow up with primary care doctor in the coming week.  Return to ER if worse, new symptoms, increased trouble breathing, other concern.

## 2017-11-11 NOTE — ED Provider Notes (Signed)
[facility] Provider Note   CSN: [id number] Arrival date & time: 11/11/17  1729     History   Chief Complaint Chief Complaint  Patient presents with  . Cough    HPI [name] is a 57 y.o. male.  Patient c/o persistent episodic cough with wheezing and sob for the past month. Cough moderate, persistent. Also c/o nasal congestion/rhinorrhea. No sore throat. No trouble swallowing. No chest pain or discomfort. No leg swelling or pain. +smoker. No fever. No body aches. No headache or neck pain/stiffness.    The history is provided by the patient.  Cough  Associated symptoms include rhinorrhea and wheezing. Pertinent negatives include no chest pain, no headaches, no sore throat and no eye redness.    Past Medical History:  Diagnosis Date  . Alcoholic (HCC)   . Bipolar 1 disorder (HCC)   . Depression   . Diabetes mellitus without complication (HCC)   . Schizoaffective schizophrenia [facility](HCC)     Patient Active Problem List   Diagnosis Date Noted  . MDD (major depressive disorder), recurrent episode, severe (HCC) 04/30/2017  . HTN (hypertension) 04/01/2016  . Homeless single person 04/01/2016  . Tobacco abuse disorder 04/01/2016    Past Surgical History:  Procedure Laterality Date  . DENTAL SURGERY     wisdom teeth       Home Medications    Prior to Admission medications   Medication Sig Start Date End Date Taking? Authorizing Provider  azithromycin (ZITHROMAX) 250 MG tablet Take 1 tablet (250 mg total) by mouth daily. Take 1 every day until finished. 10/16/17   Mesner, Barbara CowerJason, MD    Family History No family history on file.  Social History Social History   Tobacco Use  . Smoking status: Current Every Day Smoker    Packs/day: 2.00    Types: Cigarettes  . Smokeless tobacco: Never Used  Substance Use Topics  . Alcohol use: Yes  . Drug use: No     Allergies   Patient has no known allergies.   Review of  Systems Review of Systems  Constitutional: Negative for fever.  HENT: Positive for congestion and rhinorrhea. Negative for sore throat.   Eyes: Negative for redness.  Respiratory: Positive for cough and wheezing.   Cardiovascular: Negative for chest pain.  Gastrointestinal: Negative for abdominal pain.  Genitourinary: Negative for flank pain.  Musculoskeletal: Negative for neck pain and neck stiffness.  Skin: Negative for rash.  Neurological: Negative for headaches.  Hematological: Does not bruise/bleed easily.  Psychiatric/Behavioral: Negative for confusion.     Physical Exam Updated Vital Signs BP (!) 111/94 (BP Location: Right Arm)   Pulse 89   Temp 98.2 F (36.8 C) (Oral)   Resp 16   Ht 1.829 m (6')   Wt 70.3 kg (155 lb)   SpO2 99%   BMI 21.02 kg/m   Physical Exam  Constitutional: He appears well-developed and well-nourished. No distress.  HENT:  Mouth/Throat: Oropharynx is clear and moist.  Nasal congestion  Eyes: Conjunctivae are normal.  Neck: Neck supple. No tracheal deviation present.  No stiffness or rigidity  Cardiovascular: Normal rate, regular rhythm and intact distal pulses. Exam reveals no friction rub.  No murmur heard. Pulmonary/Chest: Effort normal. No accessory muscle usage. No respiratory distress. He has wheezes.  Wheezing.   Abdominal: Soft. He exhibits no distension. There is no tenderness.  Musculoskeletal: He exhibits no edema or tenderness.  Neurological: He is alert.  Skin: Skin is warm  and dry. He is not diaphoretic.  Psychiatric: He has a normal mood and affect.  Nursing note and vitals reviewed.    ED Treatments / Results  Labs (all labs ordered are listed, but only abnormal results are displayed) Labs Reviewed - No data to display  EKG  EKG Interpretation None       Radiology Dg Chest 2 View  Result Date: 11/11/2017 CLINICAL DATA:  Cough and SOB. EXAM: CHEST  2 VIEW COMPARISON:  10/19/2017. FINDINGS: The heart size and  mediastinal contours are within normal limits. Both lungs are clear. Chronic left posterior rib deformities are again noted. IMPRESSION: No active cardiopulmonary disease. Electronically Signed   By: Signa Kell M.D.   On: 11/11/2017 19:13    Procedures Procedures (including critical care time)  Medications Ordered in ED Medications  albuterol (PROVENTIL) (2.5 MG/3ML) 0.083% nebulizer solution 5 mg (5 mg Nebulization Not Given 11/11/17 1858)  ipratropium (ATROVENT) nebulizer solution 0.5 mg (0.5 mg Nebulization Not Given 11/11/17 1858)     Initial Impression / Assessment and Plan / ED Course  I have reviewed the triage vital signs and the nursing notes.  Pertinent labs & imaging results that were available during my care of the patient were reviewed by me and considered in my medical decision making (see chart for details).  Cxr.  Reviewed nursing notes and prior charts for additional history.   Albuterol neb.  Patient is breathing comfortable.  Xray reviewed - no pna.   Prednisone po, alb mdi, rec smoking cessation.  Close outpt pcp f/u.    Final Clinical Impressions(s) / ED Diagnoses   Final diagnoses:  None    ED Discharge Orders    None       Cathren Laine, MD 11/11/17 [phone]

## 2017-11-16 ENCOUNTER — Encounter: Payer: Self-pay | Admitting: Pediatric Intensive Care

## 2017-11-16 MED FILL — VENTOLIN HFA 90 MCG INHALER: 108 (90 BAS | 17 days supply | Qty: 18 | Fill #0

## 2017-11-16 MED FILL — predniSONE 20 MG TABS: 20 | 4 days supply | Qty: 12 | Fill #0

## 2017-11-28 NOTE — Congregational Nurse Program (Signed)
Congregational Nurse Program Note  Date of Encounter: 11/03/2017  Past Medical History: Past Medical History:  Diagnosis Date  . Alcoholic (HCC)   . Bipolar 1 disorder (HCC)   . Depression   . Diabetes mellitus without complication (HCC)   . Schizoaffective schizophrenia (HCC)     Encounter Details: CNP Questionnaire - 11/03/17 1000      Questionnaire   Patient Status  Not Applicable    Race  White or Caucasian    [facility]    Insurance  Medicaid    Uninsured  Not Applicable    Food  No food insecurities    Housing/Utilities  No permanent housing    Transportation  No transportation needs    Interpersonal Safety  Yes, feel physically and emotionally safe where you currently live    Medication  No medication insecurities    Medical Provider  Yes    Referrals  Not Applicable    ED Visit Averted  Yes    Life-Saving Intervention Made  Not Applicable      Client states he has had a cough since coming to the shelter- approximately 3 weeks. Denies fever, malaise. BBS- rhonchi with expiratory wheezes. CN advised PCP visit if client should develop change in color of productive cough, fever or difficulty breathing. Cleint agrees to plan.

## 2017-11-28 NOTE — Congregational Nurse Program (Signed)
Congregational Nurse Program Note  Date of Encounter: 11/16/2017  Past Medical History: Past Medical History:  Diagnosis Date  . Alcoholic (HCC)   . Bipolar 1 disorder (HCC)   . Depression   . Diabetes mellitus without complication (HCC)   . Schizoaffective schizophrenia [facility](HCC)     Encounter Details: CNP Questionnaire - 11/16/17 0945      Questionnaire   Patient Status  Not Applicable    Race  White or Caucasian    Insurance  Medicaid    Uninsured  Not Applicable    Food  No food insecurities    Housing/Utilities  No permanent housing    Transportation  No transportation needs    Interpersonal Safety  Yes, feel physically and emotionally safe where you currently live    Medication  Yes, have medication insecurities;Provided medication assistance    Medical Provider  Yes    Referrals  Medication Assistance    ED Visit Averted  Not Applicable    Life-Saving Intervention Made  Not Applicable      Client has been to his dentists and has prescription for medication. Presciption to [facility].

## 2017-12-02 ENCOUNTER — Encounter (HOSPITAL_COMMUNITY): Payer: Self-pay | Admitting: Emergency Medicine

## 2017-12-02 ENCOUNTER — Ambulatory Visit (HOSPITAL_COMMUNITY)
Admission: EM | Admit: 2017-12-02 | Discharge: 2017-12-02 | Disposition: A | Discharge status: Home / Self Care | Payer: Medicaid Other | Attending: Family Medicine | Admitting: Family Medicine

## 2017-12-02 DIAGNOSIS — B351 Tinea unguium: Secondary | ICD-10-CM

## 2017-12-02 NOTE — ED Triage Notes (Signed)
Pt c/o R big toe pain, states "its turning black" x 6 months.

## 2017-12-02 NOTE — ED Provider Notes (Signed)
  [facility]   [id number] 12/02/17 Arrival Time: 1136  ASSESSMENT & PLAN:  1. Onychomycosis    Does not want to pursue treatment options. May f/u as needed.  Reviewed expectations re: course of current medical issues. Questions answered.  SUBJECTIVE:  [name] is a 57 y.o. male who reports that the toenail of his R great toe "has been turning black" over the past 6 months.   Location: R great toe Onset: gradual Duration: approx 6 months Pruritic? No Painful? No Progression: stable  Drainage? No  Known trigger? No  Contacts with similar? No Recent travel? No  Other associated symptoms: none Therapies tried thus far: none Denies arthralgia and fever. No specific aggravating or alleviating factors reported.   ROS: As per HPI.  OBJECTIVE: Vitals:   12/02/17 1244 12/02/17 1245  BP:  (!) 89/72  Pulse: 100   Resp: 20   Temp: 98.2 F (36.8 C)   SpO2: 100%     General appearance: alert; no distress Lungs: clear to auscultation bilaterally Heart: regular rate and rhythm Extremities: no edema; R great toenail with onychomycosis causing nail to thicken and become brittle Skin: warm and dry Psychological: alert and cooperative; normal mood and affect  No Known Allergies  Past Medical History:  Diagnosis Date  . Alcoholic (HCC)   . Bipolar 1 disorder (HCC)   . Depression   . Diabetes mellitus without complication (HCC)   . Schizoaffective schizophrenia (HCC)    Social History   Socioeconomic History  . Marital status: Single    Spouse name: Not on file  . Number of children: Not on file  . Years of education: Not on file  . Highest education level: Not on file  Social Needs  . Financial resource strain: Not on file  . Food insecurity - worry: Not on file  . Food insecurity - inability: Not on file  . Transportation needs - medical: Not on file  . Transportation needs - non-medical: Not on file  Occupational History  . Not on file    Tobacco Use  . Smoking status: Current Every Day Smoker    Packs/day: 2.00    Types: Cigarettes  . Smokeless tobacco: Never Used  Substance and Sexual Activity  . Alcohol use: Yes  . Drug use: No  . Sexual activity: Not on file  Other Topics Concern  . Not on file  Social History Narrative  . Not on file   No family history on file. Past Surgical History:  Procedure Laterality Date  . DENTAL SURGERY     wisdom teeth     Mardella LaymanHagler, Arthurine Oleary, MD 12/09/17 [phone]

## 2017-12-02 NOTE — ED Notes (Signed)
Dr. hagler notified of pt vital signs 

## 2017-12-15 ENCOUNTER — Encounter (HOSPITAL_COMMUNITY): Payer: Self-pay | Admitting: Emergency Medicine

## 2017-12-15 ENCOUNTER — Other Ambulatory Visit: Payer: Self-pay

## 2017-12-15 ENCOUNTER — Emergency Department (HOSPITAL_COMMUNITY)
Admission: EM | Admit: 2017-12-15 | Discharge: 2017-12-17 | Disposition: A | Discharge status: Home / Self Care | Payer: Medicaid Other | Attending: Physician Assistant | Admitting: Physician Assistant

## 2017-12-15 DIAGNOSIS — E119 Type 2 diabetes mellitus without complications: Secondary | ICD-10-CM | POA: Diagnosis not present

## 2017-12-15 DIAGNOSIS — R45851 Suicidal ideations: Secondary | ICD-10-CM | POA: Diagnosis not present

## 2017-12-15 DIAGNOSIS — Z590 Homelessness: Secondary | ICD-10-CM | POA: Insufficient documentation

## 2017-12-15 DIAGNOSIS — F17210 Nicotine dependence, cigarettes, uncomplicated: Secondary | ICD-10-CM | POA: Diagnosis not present

## 2017-12-15 DIAGNOSIS — F10920 Alcohol use, unspecified with intoxication, uncomplicated: Secondary | ICD-10-CM

## 2017-12-15 DIAGNOSIS — F250 Schizoaffective disorder, bipolar type: Secondary | ICD-10-CM | POA: Diagnosis present

## 2017-12-15 DIAGNOSIS — F332 Major depressive disorder, recurrent severe without psychotic features: Secondary | ICD-10-CM | POA: Diagnosis not present

## 2017-12-15 HISTORY — DX: Homelessness unspecified: Z59.00

## 2017-12-15 HISTORY — DX: Homelessness: Z59.0

## 2017-12-15 LAB — CBC
HEMATOCRIT: 51 % (ref 39.0–52.0)
HEMOGLOBIN: 16.9 g/dL (ref 13.0–17.0)
MCH: 32.1 pg (ref 26.0–34.0)
MCHC: 33.1 g/dL (ref 30.0–36.0)
MCV: 96.8 fL (ref 78.0–100.0)
Platelets: 308 10*3/uL (ref 150–400)
RBC: 5.27 MIL/uL (ref 4.22–5.81)
RDW: 14.8 % (ref 11.5–15.5)
WBC: 10.2 10*3/uL (ref 4.0–10.5)

## 2017-12-15 LAB — COMPREHENSIVE METABOLIC PANEL
ALBUMIN: 4.6 g/dL (ref 3.5–5.0)
ALK PHOS: 85 U/L (ref 38–126)
ALT: 10 U/L — AB (ref 17–63)
ANION GAP: 15 (ref 5–15)
AST: 23 U/L (ref 15–41)
BILIRUBIN TOTAL: 0.5 mg/dL (ref 0.3–1.2)
BUN: 7 mg/dL (ref 6–20)
CO2: 23 mmol/L (ref 22–32)
Calcium: 9.4 mg/dL (ref 8.9–10.3)
Chloride: 105 mmol/L (ref 101–111)
Creatinine, Ser: 1.06 mg/dL (ref 0.61–1.24)
GFR calc Af Amer: >60 mL/min (ref >60)
GFR calc non Af Amer: >60 mL/min (ref >60)
GLUCOSE: 69 mg/dL (ref 65–99)
Potassium: 3.6 mmol/L (ref 3.5–5.1)
Sodium: 143 mmol/L (ref 135–145)
TOTAL PROTEIN: 7.5 g/dL (ref 6.5–8.1)

## 2017-12-15 LAB — ETHANOL: Alcohol, Ethyl (B): 239 mg/dL — ABNORMAL HIGH (ref <10)

## 2017-12-15 LAB — SALICYLATE LEVEL: Salicylate Lvl: <7 mg/dL (ref 2.8–30.0)

## 2017-12-15 LAB — ACETAMINOPHEN LEVEL

## 2017-12-15 NOTE — ED Notes (Signed)
Charge nurse, [place]had Gross, RN made aware

## 2017-12-15 NOTE — ED Notes (Signed)
Provider bedside.

## 2017-12-15 NOTE — ED Notes (Signed)
Staffing called for sitter.   

## 2017-12-15 NOTE — ED Notes (Signed)
RN moved pt to RM F11.  Pt will not talk to RN even as RN is attempting to make pt comfortable. Unable to assess.  Sitter bedside. Will continue to monitor

## 2017-12-15 NOTE — ED Triage Notes (Signed)
Pt to ED with c/o being suicidal x's 20 years.  Pt st's "I guess I don't have the balls to jump in front of a train"   Pt admits to ETOH today.

## 2017-12-15 NOTE — ED Notes (Signed)
Pt refused to give urine sample.  Informed Provider who is now bedside.

## 2017-12-15 NOTE — ED Notes (Signed)
Security called to wand pt.   Pt in wine scrubs and belongs in pt belonging bags and placed at desk

## 2017-12-15 NOTE — ED Provider Notes (Signed)
[facility] EMERGENCY DEPARTMENT Provider Note   CSN: [id number] Arrival date & time: 12/15/17  1710     History   Chief Complaint Chief Complaint  Patient presents with  . Suicidal    HPI [name] is a 57 y.o. male who presents with passive SI. PMH significant for ETOH abuse, schizophrenia, DM, Bipolar d/o. The patient refuses to answer most questions. He states that he has been having SI but cannot elaborate and states that he is confused and agitated. All he states when I walk in the room is "will you turn the light off?"  LEVEL 5 CAVEAT due to patient's intoxication  HPI  Past Medical History:  Diagnosis Date  . Alcoholic (HCC)   . Bipolar 1 disorder (HCC)   . Depression   . Diabetes mellitus without complication (HCC)   . Schizoaffective schizophrenia [facility])     Patient Active Problem List   Diagnosis Date Noted  . MDD (major depressive disorder), recurrent episode, severe (HCC) 04/30/2017  . HTN (hypertension) 04/01/2016  . Homeless single person 04/01/2016  . Tobacco abuse disorder 04/01/2016    Past Surgical History:  Procedure Laterality Date  . DENTAL SURGERY     wisdom teeth        Home Medications    Prior to Admission medications   Medication Sig Start Date End Date Taking? Authorizing Provider  albuterol (PROVENTIL HFA;VENTOLIN HFA) 108 (90 Base) MCG/ACT inhaler Inhale 2 puffs into the lungs every 4 (four) hours as needed for wheezing or shortness of breath. Patient not taking: Reported on 12/15/2017 11/11/17   Cathren Laine, MD  azithromycin (ZITHROMAX) 250 MG tablet Take 1 tablet (250 mg total) by mouth daily. Take 1 every day until finished. Patient not taking: Reported on 12/02/2017 10/16/17   Mesner, Barbara Cower, MD  cetirizine-pseudoephedrine (ZYRTEC-D) 5-120 MG tablet Take 1 tablet by mouth 2 (two) times daily as needed for allergies. Patient not taking: Reported on 12/15/2017 11/11/17   Cathren Laine, MD  predniSONE  (DELTASONE) 20 MG tablet Take 3 tablets (60 mg total) by mouth daily. Patient not taking: Reported on 12/02/2017 11/11/17   Cathren Laine, MD    Family History No family history on file.  Social History Social History   Tobacco Use  . Smoking status: Current Every Day Smoker    Packs/day: 2.00    Types: Cigarettes  . Smokeless tobacco: Never Used  Substance Use Topics  . Alcohol use: Yes  . Drug use: No     Allergies   Patient has no known allergies.   Review of Systems Review of Systems  Unable to perform ROS: Other     Physical Exam Updated Vital Signs BP (!) 153/139 (BP Location: Right Arm)   Pulse (!) 103   Temp 97.9 F (36.6 C) (Oral)   Resp 18   SpO2 98%   Physical Exam  Constitutional: He is oriented to person, place, and time. No distress.  Thin, chronically ill appearing  HENT:  Head: Normocephalic and atraumatic.  Eyes: Pupils are equal, round, and reactive to light. Conjunctivae are normal. Right eye exhibits no discharge. Left eye exhibits no discharge. No scleral icterus.  Neck: Normal range of motion.  Cardiovascular: Normal rate.  Pulmonary/Chest: Effort normal. No respiratory distress.  Abdominal: He exhibits no distension.  Neurological: He is alert and oriented to person, place, and time.  Skin: Skin is warm and dry.  Psychiatric: His speech is slurred. He expresses suicidal ideation. He expresses no  homicidal ideation. He expresses no suicidal plans and no homicidal plans. He is communicative.  Nursing note and vitals reviewed.    ED Treatments / Results  Labs (all labs ordered are listed, but only abnormal results are displayed) Labs Reviewed  COMPREHENSIVE METABOLIC PANEL - Abnormal; Notable for the following components:      Result Value   ALT 10 (*)    All other components within normal limits  ETHANOL - Abnormal; Notable for the following components:   Alcohol, Ethyl (B) 239 (*)    All other components within normal limits    ACETAMINOPHEN LEVEL - Abnormal; Notable for the following components:   Acetaminophen (Tylenol), Serum <10 (*)    All other components within normal limits  SALICYLATE LEVEL  CBC  RAPID URINE DRUG SCREEN, HOSP PERFORMED    EKG None  Radiology No results found.  Procedures Procedures (including critical care time)  Medications Ordered in ED Medications - No data to display   Initial Impression / Assessment and Plan / ED Course  I have reviewed the triage vital signs and the nursing notes.  Pertinent labs & imaging results that were available during my care of the patient were reviewed by me and considered in my medical decision making (see chart for details).  57 year old male presents with SI after ETOH consumption. He is currently intoxicated and unable to give a detailed history. He answers I don't know to almost all questions. He does report SI but will not elaborate. He is mildly tachycardic and hypertensive but otherwise vitals are normal. Labs are remarkable for ETOH of 239. UDS is pending. Pt medically cleared for TTS consult.  Final Clinical Impressions(s) / ED Diagnoses   Final diagnoses:  Suicidal thoughts  Alcoholic intoxication without complication [facility](HCC)    ED Discharge Orders    None       Bethel BornGekas, Kari Kerth Marie, PA-C 12/15/17 2324    Derwood KaplanNanavati, Ankit, MD 12/17/17 [phone]

## 2017-12-16 ENCOUNTER — Other Ambulatory Visit: Payer: Self-pay

## 2017-12-16 ENCOUNTER — Encounter (HOSPITAL_COMMUNITY): Payer: Self-pay | Admitting: *Deleted

## 2017-12-16 ENCOUNTER — Emergency Department (HOSPITAL_COMMUNITY): Payer: Medicaid Other

## 2017-12-16 LAB — RAPID URINE DRUG SCREEN, HOSP PERFORMED
Amphetamines: NOT DETECTED
BARBITURATES: NOT DETECTED
Benzodiazepines: NOT DETECTED
COCAINE: NOT DETECTED
Opiates: NOT DETECTED
TETRAHYDROCANNABINOL: NOT DETECTED

## 2017-12-16 MED ORDER — THIAMINE HCL 100 MG/ML IJ SOLN: 100.0000 mg | Freq: Every day | INTRAMUSCULAR | Status: DC

## 2017-12-16 MED ORDER — VITAMIN B-1 100 MG PO TABS
100.0000 mg | ORAL_TABLET | Freq: Every day | ORAL | Status: DC
  Administered 2017-12-16: 100 mg via ORAL
  Filled 2017-12-16 (×2): qty 1

## 2017-12-16 MED ORDER — LORAZEPAM 1 MG PO TABS
1.0000 mg | ORAL_TABLET | Freq: Once | ORAL | Status: AC
  Administered 2017-12-16: 1 mg via ORAL
  Filled 2017-12-16: qty 1

## 2017-12-16 MED ORDER — LORAZEPAM 2 MG/ML IJ SOLN: 0.0000 mg | Freq: Two times a day (BID) | INTRAMUSCULAR | Status: DC

## 2017-12-16 MED ORDER — LORAZEPAM 2 MG/ML IJ SOLN: 0.0000 mg | Freq: Four times a day (QID) | INTRAMUSCULAR | Status: DC

## 2017-12-16 MED ORDER — LORAZEPAM 1 MG PO TABS: 0.0000 mg | ORAL_TABLET | Freq: Four times a day (QID) | ORAL | Status: DC

## 2017-12-16 MED ORDER — LORAZEPAM 1 MG PO TABS: 0.0000 mg | ORAL_TABLET | Freq: Two times a day (BID) | ORAL | Status: DC

## 2017-12-16 NOTE — ED Notes (Signed)
Dr Clarene DukeLittle aware pt's BP 79/44 - 103/71 on recheck and pt c/o right chest pain when coughs - nonprod. Order received for CXR. Pt states he does not feel good but will not advise what is bothering him.

## 2017-12-16 NOTE — ED Notes (Signed)
Carb Modified Diet ordered for Lunch. 

## 2017-12-16 NOTE — ED Notes (Signed)
Carb Modified Diet was ordered for Dinner. 

## 2017-12-16 NOTE — [facility] Counselor (Signed)
Pt to have AM psych eval per S. Rankin.  AM psych eval to occur on 3/28.

## 2017-12-16 NOTE — ED Notes (Signed)
Woke pt so may administer meds and pt may eat lunch. Pt initially stated he was not hungry. Encouraged pt to eat.

## 2017-12-16 NOTE — ED Notes (Signed)
Woke pt so may eat dinner - declined - even after much encouragement.

## 2017-12-16 NOTE — BH Assessment (Addendum)
Tele Assessment Note   Patient Name: [name] MRN: [id number] Referring Physician: PA Terance Hart Location of Patient: [facility] ED Location of Provider: Behavioral Health TTS Department  [name] is an 57 y.o. male.  The pt came in due to being suicidal.  The pt denies having a plan of how he would commit suicide.  When asked what was his main stressor, the pt responded with, "people like you."  The pt was also asked if he had any previous suicide attempts and he stated, "Yeah, I put a gun in my mouth and pulled the trigger."  The pt is currently homeless and stated he is wanting to find a place to stay.  The pt is divorced "since the 26's" and stated "I have no family".  When asked if he has hallucinations the pt stated he does, but refused to go into detail and then requested to end the assessment, because he was agitated.  The pt has a history of alcoholism and had a blood alcohol level of 239.  The pt's memory was unable to be assessed.  However, according to previous records from 04/2017 the pt's MMSE was 19 of 30.  The pt was last admitted to [facility] 04/2017 due to depression and substance use.    Diagnosis: F33.2 Major depressive disorder, Recurrent episode, Severe F10.20 Alcohol use disorder, Severe   Past Medical History:  Past Medical History:  Diagnosis Date  . Alcoholic (HCC)   . Bipolar 1 disorder (HCC)   . Depression   . Diabetes mellitus without complication (HCC)   . Schizoaffective schizophrenia [facility])     Past Surgical History:  Procedure Laterality Date  . DENTAL SURGERY     wisdom teeth    Family History: No family history on file.  Social History:  reports that he has been smoking cigarettes.  He has been smoking about 2.00 packs per day. He has never used smokeless tobacco. He reports that he drinks alcohol. He reports that he does not use drugs.  Additional Social History:  Alcohol / Drug Use Pain Medications: See MAR Prescriptions: See  MAR Over the Counter: See MAR History of alcohol / drug use?: Yes Longest period of sobriety (when/how long): unknown Substance #1 Name of Substance 1: Alcohol 1 - Last Use / Amount: 12/15/2017  CIWA: CIWA-Ar BP: 101/71 Pulse Rate: (!) 106 COWS:    Allergies: No Known Allergies  Home Medications:  (Not in a hospital admission)  OB/GYN Status:  No LMP for male patient.  General Assessment Data Location of Assessment: [facility] ED TTS Assessment: In system Is this a Tele or Face-to-Face Assessment?: Tele Assessment Is this an Initial Assessment or a Re-assessment for this encounter?: Initial Assessment Marital status: Divorced [place] name: NA Is patient pregnant?: Other (Comment)(Male) Living Arrangements: Alone, Other (Comment)(currently homeless) Can pt return to current living arrangement?: Yes Admission Status: Voluntary Is patient capable of signing voluntary admission?: Yes Referral Source: Self/Family/Friend Insurance type: Medicaid     Crisis Care Plan Living Arrangements: Alone, Other (Comment)(currently homeless) Legal Guardian: Other:(Self) Name of Psychiatrist: unable to assess Name of Therapist: unable to assess  Education Status Is patient currently in school?: No Is the patient employed, unemployed or receiving disability?: Unemployed  Risk to self with the past 6 months Suicidal Ideation: Yes-Currently Present Has patient been a risk to self within the past 6 months prior to admission? : Other (comment)(unable to assess) Suicidal Intent: No Has patient had any suicidal intent within the  past 6 months prior to admission? : No Is patient at risk for suicide?: Yes Suicidal Plan?: No Has patient had any suicidal plan within the past 6 months prior to admission? 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 : No Thoughts of Harm to Others: No Current Homicidal Intent: No Current Homicidal Plan: No Access to Homicidal Means: No Identified Victim: none History of harm to others?: No Assessment of Violence: None Noted Violent Behavior Description: none Does patient have access to weapons?: No Criminal Charges Pending?: No Does patient have a court date: No Is patient on probation?: No  Psychosis Hallucinations: (stated he has hallucinations but refused to elaborate) Delusions: None noted  Mental Status Report Appearance/Hygiene: In scrubs, Unremarkable Eye Contact: Poor Motor Activity: Unable to assess Speech: Pressured Level of Consciousness: Irritable, Alert Mood: Irritable Affect: Angry, Irritable Anxiety Level: Moderate Thought Processes: Coherent, Relevant Judgement: Impaired Orientation: Unable to assess Obsessive Compulsive Thoughts/Behaviors: None  Cognitive Functioning Concentration: Unable to Assess Memory: Unable to Assess Is patient IDD: No Is patient DD?: No Insight: Poor Impulse Control: Fair Appetite: (unable to assess) Have you had any weight changes? : No Change Sleep: Unable to Assess Vegetative Symptoms: None  ADLScreening [facility] Assessment Services) Patient's cognitive ability adequate to safely complete daily activities?: Yes Patient  able to express need for assistance with ADLs?: Yes Independently performs ADLs?: Yes (appropriate for developmental age)  Prior Inpatient Therapy Prior Inpatient Therapy: Yes Prior Therapy Dates: 04/2017 [facility] Reason for Treatment: SI and substance use  Prior Outpatient Therapy Prior Outpatient Therapy: No Does patient have an ACCT team?: No Does patient have Intensive In-House Services?  : No Does patient have [facility] services? : No Does patient have P4CC services?: No  ADL Screening (condition at time of admission) Patient's cognitive ability adequate to safely complete daily activities?: Yes Patient able to express need for assistance with ADLs?: Yes Independently performs ADLs?: Yes (appropriate for developmental age)       Abuse/Neglect Assessment (Assessment to be complete while patient is alone) Abuse/Neglect Assessment Can Be Completed: Yes Physical Abuse: Denies Verbal Abuse: Denies Sexual Abuse: Denies Exploitation of patient/patient's resources: Denies Self-Neglect: Denies Values / Beliefs Cultural Requests During Hospitalization: None Spiritual Requests During Hospitalization: None Consults Spiritual Care Consult Needed: No Social Work Consult Needed: No      Additional Information 1:1 In Past 12 Months?: No CIRT Risk: No Elopement Risk: No Does patient have medical clearance?: Yes     Disposition:  Disposition Initial Assessment Completed for this Encounter: Yes Disposition of Patient: (reassess in the AM) Patient refused recommended treatment: No Mode of transportation if patient is discharged?: N/A   PA Donell SievertSpencer Simon recommends the pt be observed and reassessed in the AM.  RN Victorino DikeJennifer and PA Terance HartKelly Gekas was made aware of the recommendation.  This service was  provided via telemedicine using a 2-way, interactive audio and video technology.  Names of all persons participating in this telemedicine service and  their role in this encounter. Name: [name] Role: Pt  Name: Riley Churches Role: TTS  Name:  Role:   Name:  Role:     Ottis Stain 12/16/2017 12:30 AM

## 2017-12-17 ENCOUNTER — Encounter (HOSPITAL_COMMUNITY): Payer: Self-pay | Admitting: Registered Nurse

## 2017-12-17 DIAGNOSIS — Y907 Blood alcohol level of 200-239 mg/100 ml: Secondary | ICD-10-CM

## 2017-12-17 DIAGNOSIS — F17210 Nicotine dependence, cigarettes, uncomplicated: Secondary | ICD-10-CM

## 2017-12-17 DIAGNOSIS — Z560 Unemployment, unspecified: Secondary | ICD-10-CM

## 2017-12-17 DIAGNOSIS — F10920 Alcohol use, unspecified with intoxication, uncomplicated: Secondary | ICD-10-CM

## 2017-12-17 DIAGNOSIS — R45851 Suicidal ideations: Secondary | ICD-10-CM

## 2017-12-17 NOTE — ED Notes (Signed)
Patient was give A Sprite and Cookies and A Carb Modified Diet was ordered for Lunch.

## 2017-12-17 NOTE — ED Notes (Signed)
Pt refusing to speak to nurse or MD about discharge. Refusing to sign paperwork for returned belongings, medications, and computer signature for discharge. Pt given belongings and medications, patient changed into his clothing and left with discharge paperwork.

## 2017-12-17 NOTE — ED Provider Notes (Signed)
[facility] pt alert oriented and appears nontoxic at time of discharge.   [name], Livianna Petraglia Lyn, MD 12/17/17 1348

## 2017-12-17 NOTE — ED Notes (Signed)
In to assess pt vital signs, pt refusing to talk to engage with this RN. Pt stated loudly "NO!" when asked if he would answer questions for me. Asked pt about pain, nausea or malaise and pt refused to answer questions. Offered medication to pt if he was feeling poorly, pt did not respond. Compliant w/ vital signs, but quickly covered head back with blanket and turned back to this RN after BP cuff removed

## 2017-12-17 NOTE — ED Notes (Signed)
Pt speaking with TTS now.  

## 2017-12-17 NOTE — Consult Note (Signed)
Telepsych Consultation   Reason for Consult:  Suicidal ideation Referring Physician:  Recardo Evangelist, PA-C Location of Patient: [facility] Location of Provider: [facility]  Patient Identification: [name] MRN:  [id number] Principal Diagnosis: <principal problem not specified> Diagnosis:   Patient Active Problem List   Diagnosis Date Noted  . MDD (major depressive disorder), recurrent episode, severe ([place]) [F33.2] 04/30/2017  . HTN (hypertension) [I10] 04/01/2016  . Homeless single person [Z59.0] 04/01/2016  . Tobacco abuse disorder [Z72.0] 04/01/2016    Total Time spent with patient: 45 minutes  Subjective:   [name] is a 57 y.o. male patient presented to [place] intoxicated and complaints of suicidal ideation.  HPI:   Per TTS initial tele assessment on 12/16/17  [name] is an 57 y.o. male.  The pt came in due to being suicidal.  The pt denies having a plan of how he would commit suicide.  When asked what was his main stressor, the pt responded with, "people like you."  The pt was also asked if he had any previous suicide attempts and he stated, "Yeah, I put a gun in my mouth and pulled the trigger."  The pt is currently homeless and stated he is wanting to find a place to stay.  The pt is divorced "since the 49's" and stated "I have no family".  When asked if he has hallucinations the pt stated he does, but refused to go into detail and then requested to end the assessment, because he was agitated.  The pt has a history of alcoholism and had a blood alcohol level of 239.  The pt's memory was unable to be assessed.  However, according to previous records from 04/2017 the pt's MMSE was 19 of 30.  The pt was last admitted to [facility] 04/2017 due to depression and substance use.    Per psychiatric tele assessment 12/17/17 Troy Amble Parenteau, 57 y.o., male patient  seen via telepsych by this provider; chart reviewed and consulted with Dr.  Dwyane Dee on 12/17/17.    On evaluation [name] reports when asked what happened to get him in the hospital "I forget.  Oh, I wanted to commit suicide."  When continued with interview questions patient responded "I do not care for all his questions we have answers right in front of you."  Patient denied homicidal ideation.  When asked if prior suicide attempt patient responded " doesn't matter" when asked if he was paranoid patient responded  "do not know" when asked if he was having suicidal thoughts he responded "don't matter" when asked if he had a suicidal plan he responded "I'm working on it; thinking of things.  I do not have access to anything right now but I be out of here soon enough."  When asked if he was psychotic (here and is seeing things other people do not) patient responded "don't know" patient also states he does not remember if he is ever had psychiatric inpatient or outpatient treatment.  He denies illicit drug use and states that he drinks alcohol very seldom.  When asked how much he had drank prior to his admission to the ED he stated one beer when informed that his ETOH level was 239 he stated that it was wrong.  When asked where he lives patient responded "don't matter" patient is finally stated I have asked several more times that he lives in [place] alone and that he was homeless During evaluation [name] is  alert/oriented x 4; and agitated through entire assessment ; but held his hand in front of his face the whole entire assessment; when asked why he was holding his hands a stated because the light was bright after the lights were turned down he continued to hold his hand over his face.  He does not appear to be responding to internal/external stimuli or delusional thoughts.  Continue to report passive suicidal thoughts with no plan; and responded that "Don't know of Doesn't matter to psychosis and paranoia; but denies homicidal ideation.    Past Psychiatric  History: Responded that he did not know; No found records of inpatient or outpatient treatment.  Risk to Self: Suicidal Ideation: Yes-Currently Present Suicidal Intent: No Is patient at risk for suicide?: Yes Suicidal Plan?: No Access to Means: No What has been your use of drugs/alcohol within the last 12 months?: alcohol use How many times?: 0 Other Self Harm Risks: alcohol use Triggers for Past Attempts: Unknown Intentional Self Injurious Behavior: None Risk to Others: Homicidal Ideation: No Thoughts of Harm to Others: No Current Homicidal Intent: No Current Homicidal Plan: No Access to Homicidal Means: No Identified Victim: none History of harm to others?: No Assessment of Violence: None Noted Violent Behavior Description: none Does patient have access to weapons?: No Criminal Charges Pending?: No Does patient have a court date: No Prior Inpatient Therapy: Prior Inpatient Therapy: Yes Prior Therapy Dates: 04/2017 Prior Therapy Facilty/Provider(s): [facility] Reason for Treatment: SI and substance use Prior Outpatient Therapy: Prior Outpatient Therapy: No Does patient have an ACCT team?: No Does patient have Intensive In-House Services?  : No Does patient have [facility] services? : No Does patient have P4CC services?: No  Past Medical History:  Past Medical History:  Diagnosis Date  . Alcoholic ([place])   . Bipolar 1 disorder ([place])   . Depression   . Diabetes mellitus without complication ([place])   . Homeless   . Schizoaffective schizophrenia [facility])     Past Surgical History:  Procedure Laterality Date  . DENTAL SURGERY     wisdom teeth   Family History: History reviewed. No pertinent family history. Family Psychiatric  History: Unaware Social History:  Social History   Substance and Sexual Activity  Alcohol Use Yes     Social History   Substance and Sexual Activity  Drug Use No    Social History   Socioeconomic History  . Marital status: Single    Spouse  name: Not on file  . Number of children: Not on file  . Years of education: Not on file  . Highest education level: Not on file  Occupational History  . Not on file  Social Needs  . Financial resource strain: Not on file  . Food insecurity:    Worry: Not on file    Inability: Not on file  . Transportation needs:    Medical: Not on file    Non-medical: Not on file  Tobacco Use  . Smoking status: Current Every Day Smoker    Packs/day: 2.00    Types: Cigarettes  . Smokeless tobacco: Never Used  Substance and Sexual Activity  . Alcohol use: Yes  . Drug use: No  . Sexual activity: Not on file  Lifestyle  . Physical activity:    Days per week: Not on file    Minutes per session: Not on file  . Stress: Not on file  Relationships  . Social connections:    Talks on phone: Not on file  Gets together: Not on file    Attends religious service: Not on file    Active member of club or organization: Not on file    Attends meetings of clubs or organizations: Not on file    Relationship status: Not on file  Other Topics Concern  . Not on file  Social History Narrative  . Not on file   Additional Social History:    Allergies:  No Known Allergies  Labs:  Results for orders placed or performed during the hospital encounter of 12/15/17 (from the past 48 hour(s))  Comprehensive metabolic panel     Status: Abnormal   Collection Time: 12/15/17  6:20 PM  Result Value Ref Range   Sodium 143 135 - 145 mmol/L   Potassium 3.6 3.5 - 5.1 mmol/L   Chloride 105 101 - 111 mmol/L   CO2 23 22 - 32 mmol/L   Glucose, Bld 69 65 - 99 mg/dL   BUN 7 6 - 20 mg/dL   Creatinine, Ser 1.06 0.61 - 1.24 mg/dL   Calcium 9.4 8.9 - 10.3 mg/dL   Total Protein 7.5 6.5 - 8.1 g/dL   Albumin 4.6 3.5 - 5.0 g/dL   AST 23 15 - 41 U/L   ALT 10 (L) 17 - 63 U/L   Alkaline Phosphatase 85 38 - 126 U/L   Total Bilirubin 0.5 0.3 - 1.2 mg/dL   GFR calc non Af Amer >60 >60 mL/min   GFR calc Af Amer >60 >60 mL/min     Comment: (NOTE) The eGFR has been calculated using the CKD EPI equation. This calculation has not been validated in all clinical situations. eGFR's persistently <60 mL/min signify possible Chronic Kidney Disease.    Anion gap 15 5 - 15    Comment: Performed at [facility] [address] [place], [place] [address]  Ethanol     Status: Abnormal   Collection Time: 12/15/17  6:20 PM  Result Value Ref Range   Alcohol, Ethyl (B) 239 (H) <10 mg/dL    Comment:        LOWEST DETECTABLE LIMIT FOR SERUM ALCOHOL IS 10 mg/dL FOR MEDICAL PURPOSES ONLY Performed at [facility], [place] [address], [place], [place] [address]   Salicylate level     Status: None   Collection Time: 12/15/17  6:20 PM  Result Value Ref Range   Salicylate Lvl <8.2 2.8 - 30.0 mg/dL    Comment: Performed at [facility] [address] [place], [place] [address]  Acetaminophen level     Status: Abnormal   Collection Time: 12/15/17  6:20 PM  Result Value Ref Range   Acetaminophen (Tylenol), Serum <10 (L) 10 - 30 ug/mL    Comment:        THERAPEUTIC CONCENTRATIONS VARY SIGNIFICANTLY. A RANGE OF 10-30 ug/mL MAY BE AN EFFECTIVE CONCENTRATION FOR MANY PATIENTS. HOWEVER, SOME ARE BEST TREATED AT CONCENTRATIONS OUTSIDE THIS RANGE. ACETAMINOPHEN CONCENTRATIONS >150 ug/mL AT 4 HOURS AFTER INGESTION AND >50 ug/mL AT 12 HOURS AFTER INGESTION ARE OFTEN ASSOCIATED WITH TOXIC REACTIONS. Performed at [facility], [place] [address] [place], [place] [address]   cbc     Status: None   Collection Time: 12/15/17  6:20 PM  Result Value Ref Range   WBC 10.2 4.0 - 10.5 K/uL   RBC 5.27 4.22 - 5.81 MIL/uL   Hemoglobin 16.9 13.0 - 17.0 g/dL   HCT 51.0 39.0 - 52.0 %   MCV 96.8 78.0 -  100.0 fL   MCH 32.1 26.0 - 34.0 pg   MCHC 33.1 30.0 - 36.0 g/dL   RDW 14.8 11.5 - 15.5 %   Platelets 308 150 - 400 K/uL    Comment: Performed at [facility], [place] [address] [place], [place] [address]  Rapid urine  drug screen (hospital performed)     Status: None   Collection Time: 12/16/17  6:50 AM  Result Value Ref Range   Opiates NONE DETECTED NONE DETECTED   Cocaine NONE DETECTED NONE DETECTED   Benzodiazepines NONE DETECTED NONE DETECTED   Amphetamines NONE DETECTED NONE DETECTED   Tetrahydrocannabinol NONE DETECTED NONE DETECTED   Barbiturates NONE DETECTED NONE DETECTED    Comment: (NOTE) DRUG SCREEN FOR MEDICAL PURPOSES ONLY.  IF CONFIRMATION IS NEEDED FOR ANY PURPOSE, NOTIFY LAB WITHIN 5 DAYS. LOWEST DETECTABLE LIMITS FOR URINE DRUG SCREEN Drug Class                     Cutoff (ng/mL) Amphetamine and metabolites    1000 Barbiturate and metabolites    200 Benzodiazepine                 751 Tricyclics and metabolites     300 Opiates and metabolites        300 Cocaine and metabolites        300 THC                            50 Performed at [facility], [place] [address] [place], [place] [address]     Medications:  Current Facility-Administered Medications  Medication Dose Route Frequency Provider Last Rate Last Dose  . LORazepam (ATIVAN) injection 0-4 mg  0-4 mg Intravenous Q6H Little, Wenda Overland, MD       Or  . LORazepam (ATIVAN) tablet 0-4 mg  0-4 mg Oral Q6H Little, Wenda Overland, MD   Stopped at 12/16/17 1916  . [START ON 12/18/2017] LORazepam (ATIVAN) injection 0-4 mg  0-4 mg Intravenous Q12H Little, Wenda Overland, MD       Or  . Derrill Memo ON 12/18/2017] LORazepam (ATIVAN) tablet 0-4 mg  0-4 mg Oral Q12H Little, Wenda Overland, MD      . thiamine (VITAMIN B-1) tablet 100 mg  100 mg Oral Daily Little, Wenda Overland, MD   100 mg at 12/16/17 1343   Or  . thiamine (B-1) injection 100 mg  100 mg Intravenous Daily Little, Wenda Overland, MD       Current Outpatient Medications  Medication Sig Dispense Refill  . albuterol (PROVENTIL HFA;VENTOLIN HFA) 108 (90 Base) MCG/ACT inhaler Inhale 2 puffs into the lungs every 4 (four) hours as needed for wheezing or shortness of breath.  (Patient not taking: Reported on 12/15/2017) 1 Inhaler 1  . azithromycin (ZITHROMAX) 250 MG tablet Take 1 tablet (250 mg total) by mouth daily. Take 1 every day until finished. (Patient not taking: Reported on 12/02/2017) 4 tablet 0  . cetirizine-pseudoephedrine (ZYRTEC-D) 5-120 MG tablet Take 1 tablet by mouth 2 (two) times daily as needed for allergies. (Patient not taking: Reported on 12/15/2017) 20 tablet 0  . predniSONE (DELTASONE) 20 MG tablet Take 3 tablets (60 mg total) by mouth daily. (Patient not taking: Reported on 12/02/2017) 12 tablet 0    Musculoskeletal: Strength & Muscle Tone: within normal limits Gait & Station: normal Patient leans: N/A  Psychiatric Specialty Exam: Physical Exam  Constitutional: He  is oriented to person, place, and time.  Neck: Normal range of motion.  Respiratory: Effort normal.  Neurological: He is alert and oriented to person, place, and time.    Review of Systems  Psychiatric/Behavioral: Positive for depression and substance abuse (ETOH 236, denies problem with alcohol). Hallucinations: "Don't know" Suicidal ideas: Passive; no plan.  All other systems reviewed and are negative.   Blood pressure 94/67, pulse 98, temperature 98.2 F (36.8 C), temperature source Oral, resp. rate 18, SpO2 96 %.There is no height or weight on file to calculate BMI.  General Appearance: Casual  Eye Contact:  Had coving face  Speech:  Clear and Coherent and Normal Rate  Volume:  Normal  Mood:  Irritable  Affect:  Angry  Thought Process:  Coherent  Orientation:  Full (Time, Place, and Person)  Thought Content:  Patient does not appear to be responding to internal/external stimuli; when asked he would respond "Don't know or Don't matter"  Suicidal Thoughts:  Passive suicidal thoughts "tired of life, Don't care" no plan or intent  Homicidal Thoughts:  No  Memory:  Patient answered "I don't know, Don't know" to most questions; states that he has always had a problem with his  memory  Judgement:  Intact  Insight:  Fair  Psychomotor Activity:  Normal  Concentration:  Concentration: Fair and Attention Span: Fair  Recall:  Poor  Fund of Knowledge:  Fair  Language:  Good  Akathisia:  No  Handed:  Right  AIMS (if indicated):     Assets:  Desire for Improvement Physical Health  ADL's:  Intact  Cognition:  WNL  Sleep:        Treatment Plan Summary: Plan Psychiatrically cleared.  Patient to follow up with [facility]  Give resources for other community services  Disposition: No evidence of imminent risk to self or others at present.   Patient does not meet criteria for psychiatric inpatient admission.  This service was provided via telemedicine using a 2-way, interactive audio and video technology.  Names of all persons participating in this telemedicine service and their role in this encounter. Name: Earleen Newport, NP Role: Telepsych  Name: Dr Dwyane Dee Role: Psychiatrist  Name: [name] Role: Patient  Name:  Role:     Earleen Newport, NP 12/17/2017 11:21 AM

## 2017-12-17 NOTE — ED Notes (Signed)
Pt ambulated to bathroom with steady gait, denies needs or complaints at this time. Returned to bed and began sleeping again.

## 2018-03-11 ENCOUNTER — Ambulatory Visit (HOSPITAL_COMMUNITY)
Admission: EM | Admit: 2018-03-11 | Discharge: 2018-03-11 | Disposition: A | Discharge status: Home / Self Care | Payer: Medicaid Other | Attending: Family Medicine | Admitting: Family Medicine

## 2018-03-11 ENCOUNTER — Other Ambulatory Visit: Payer: Self-pay

## 2018-03-11 ENCOUNTER — Encounter (HOSPITAL_COMMUNITY): Payer: Self-pay | Admitting: Emergency Medicine

## 2018-03-11 ENCOUNTER — Encounter (HOSPITAL_COMMUNITY): Payer: Self-pay

## 2018-03-11 ENCOUNTER — Emergency Department (HOSPITAL_COMMUNITY)
Admission: EM | Admit: 2018-03-11 | Discharge: 2018-03-11 | Discharge status: Against Medical Advice | Payer: Medicaid Other | Attending: Emergency Medicine | Admitting: Emergency Medicine

## 2018-03-11 DIAGNOSIS — F17210 Nicotine dependence, cigarettes, uncomplicated: Secondary | ICD-10-CM | POA: Insufficient documentation

## 2018-03-11 DIAGNOSIS — M10071 Idiopathic gout, right ankle and foot: Secondary | ICD-10-CM | POA: Insufficient documentation

## 2018-03-11 DIAGNOSIS — Z590 Homelessness: Secondary | ICD-10-CM | POA: Diagnosis not present

## 2018-03-11 DIAGNOSIS — E119 Type 2 diabetes mellitus without complications: Secondary | ICD-10-CM | POA: Diagnosis not present

## 2018-03-11 DIAGNOSIS — M109 Gout, unspecified: Secondary | ICD-10-CM

## 2018-03-11 DIAGNOSIS — M79674 Pain in right toe(s): Secondary | ICD-10-CM | POA: Diagnosis present

## 2018-03-11 DIAGNOSIS — R42 Dizziness and giddiness: Secondary | ICD-10-CM

## 2018-03-11 DIAGNOSIS — I951 Orthostatic hypotension: Secondary | ICD-10-CM | POA: Diagnosis not present

## 2018-03-11 DIAGNOSIS — Z79899 Other long term (current) drug therapy: Secondary | ICD-10-CM | POA: Diagnosis not present

## 2018-03-11 LAB — BASIC METABOLIC PANEL
ANION GAP: 8 (ref 5–15)
BUN: 12 mg/dL (ref 6–20)
CALCIUM: 9.2 mg/dL (ref 8.9–10.3)
CO2: 27 mmol/L (ref 22–32)
Chloride: 104 mmol/L (ref 101–111)
Creatinine, Ser: 1.23 mg/dL (ref 0.61–1.24)
GFR calc Af Amer: >60 mL/min (ref >60)
GFR calc non Af Amer: >60 mL/min (ref >60)
GLUCOSE: 121 mg/dL — AB (ref 65–99)
POTASSIUM: 4.1 mmol/L (ref 3.5–5.1)
Sodium: 139 mmol/L (ref 135–145)

## 2018-03-11 LAB — CBC WITH DIFFERENTIAL/PLATELET
ABS IMMATURE GRANULOCYTES: 0 10*3/uL (ref 0.0–0.1)
BASOS PCT: 0 %
Basophils Absolute: 0 10*3/uL (ref 0.0–0.1)
EOS ABS: 0.1 10*3/uL (ref 0.0–0.7)
Eosinophils Relative: 1 %
HCT: 48.4 % (ref 39.0–52.0)
Hemoglobin: 15.3 g/dL (ref 13.0–17.0)
IMMATURE GRANULOCYTES: 0 %
Lymphocytes Relative: 15 %
Lymphs Abs: 1.6 10*3/uL (ref 0.7–4.0)
MCH: 30.4 pg (ref 26.0–34.0)
MCHC: 31.6 g/dL (ref 30.0–36.0)
MCV: 96 fL (ref 78.0–100.0)
MONOS PCT: 8 %
Monocytes Absolute: 0.9 10*3/uL (ref 0.1–1.0)
NEUTROS ABS: 7.9 10*3/uL — AB (ref 1.7–7.7)
NEUTROS PCT: 76 %
Platelets: 217 10*3/uL (ref 150–400)
RBC: 5.04 MIL/uL (ref 4.22–5.81)
RDW: 13.6 % (ref 11.5–15.5)
WBC: 10.5 10*3/uL (ref 4.0–10.5)

## 2018-03-11 LAB — I-STAT TROPONIN, ED: TROPONIN I, POC: 0 ng/mL (ref 0.00–0.08)

## 2018-03-11 MED ORDER — INDOMETHACIN 50 MG PO CAPS: 50.0000 mg | ORAL_CAPSULE | Freq: Three times a day (TID) | ORAL | 0 refills | Status: DC

## 2018-03-11 MED ORDER — SODIUM CHLORIDE 0.9 % IV BOLUS: 1000.0000 mL | Freq: Once | INTRAVENOUS | Status: DC

## 2018-03-11 MED ORDER — INDOMETHACIN 50 MG PO CAPS: 50.0000 mg | ORAL_CAPSULE | Freq: Three times a day (TID) | ORAL | 2 refills | Status: AC

## 2018-03-11 NOTE — ED Triage Notes (Signed)
Right great toe pain-says this is gout.  This episode started this morning

## 2018-03-11 NOTE — ED Provider Notes (Addendum)
[facility] EMERGENCY DEPARTMENT Provider Note   CSN: [id number] Arrival date & time: 03/11/18  0818     History   Chief Complaint Chief Complaint  Patient presents with  . Toe Pain  . Dizziness    HPI [name] is a 57 y.o. male with a hx of alcohol abuse, tobacco abuse, bipolar 1 disorder, depression, DM, homelessness, and schizoaffective disorder who presents to the ED with complaints of R great toe pain and dizziness today.   Regarding right great toe pain, patient states he woke up with the discomfort, states it is constant, 10 out of 10 in severity, worse with ambulation, no alleviating factors.  States similar to previous gout, he has had gout in this location in the past.  Denies numbness, weakness, fever, chills, or pain in any other specific joints.  Regarding dizziness, patient describes this as "feeling as though I stood up too quickly" but more persistent.  Worse with position changes.  No specific alleviating factors.  He may have had some decreased p.o. intake yesterday, but he is not sure.  No recent change in medications.  States he does not feel like the room is spinning.  Denies change in vision, headache, numbness, weakness, chest pain, or dyspnea.  No recent head injury.  HPI  Past Medical History:  Diagnosis Date  . Alcoholic (HCC)   . Bipolar 1 disorder (HCC)   . Depression   . Diabetes mellitus without complication (HCC)   . Homeless   . Schizoaffective schizophrenia [facility])     Patient Active Problem List   Diagnosis Date Noted  . Alcoholic intoxication without complication (HCC)   . Suicidal thoughts   . MDD (major depressive disorder), recurrent episode, severe (HCC) 04/30/2017  . HTN (hypertension) 04/01/2016  . Homeless single person 04/01/2016  . Tobacco abuse disorder 04/01/2016    Past Surgical History:  Procedure Laterality Date  . DENTAL SURGERY     wisdom teeth        Home Medications    Prior to  Admission medications   Medication Sig Start Date End Date Taking? Authorizing Provider  albuterol (PROVENTIL HFA;VENTOLIN HFA) 108 (90 Base) MCG/ACT inhaler Inhale 2 puffs into the lungs every 4 (four) hours as needed for wheezing or shortness of breath. Patient not taking: Reported on 12/15/2017 11/11/17   Cathren Laine, MD  azithromycin (ZITHROMAX) 250 MG tablet Take 1 tablet (250 mg total) by mouth daily. Take 1 every day until finished. Patient not taking: Reported on 12/02/2017 10/16/17   Mesner, Barbara Cower, MD  cetirizine-pseudoephedrine (ZYRTEC-D) 5-120 MG tablet Take 1 tablet by mouth 2 (two) times daily as needed for allergies. Patient not taking: Reported on 12/15/2017 11/11/17   Cathren Laine, MD  predniSONE (DELTASONE) 20 MG tablet Take 3 tablets (60 mg total) by mouth daily. Patient not taking: Reported on 12/02/2017 11/11/17   Cathren Laine, MD    Family History History reviewed. No pertinent family history.  Social History Social History   Tobacco Use  . Smoking status: Current Every Day Smoker    Packs/day: 2.00    Types: Cigarettes  . Smokeless tobacco: Never Used  Substance Use Topics  . Alcohol use: Yes  . Drug use: No     Allergies   Patient has no known allergies.   Review of Systems Review of Systems  Constitutional: Negative for chills and fever.  HENT: Negative for ear pain.   Eyes: Negative for visual disturbance.  Respiratory: Negative for shortness  of breath.   Cardiovascular: Negative for chest pain.  Gastrointestinal: Negative for nausea and vomiting.  Musculoskeletal: Positive for arthralgias (R 1st MTP).  Neurological: Positive for dizziness (more lightheaded). Negative for seizures, syncope, weakness, numbness and headaches.  All other systems reviewed and are negative.  Physical Exam Updated Vital Signs BP (!) 141/107 (BP Location: Right Arm)   Pulse 65   Temp 97.7 F (36.5 C)   Resp 16   Ht 6\' 1"  (1.854 m)   Wt 68 kg (150 lb)   SpO2 99%    BMI 19.79 kg/m   Physical Exam  Constitutional: He appears well-developed and well-nourished.  Non-toxic appearance. No distress.  HENT:  Head: Normocephalic and atraumatic. Head is without raccoon's eyes and without Battle's sign.  Right Ear: Tympanic membrane normal.  Left Ear: Tympanic membrane normal.  Nose: Nose normal.  Mouth/Throat: Uvula is midline. Mucous membranes are dry. No oropharyngeal exudate or posterior oropharyngeal erythema.  Eyes: Pupils are equal, round, and reactive to light. Conjunctivae and EOM are normal. Right eye exhibits no discharge. Left eye exhibits no discharge. Right eye exhibits no nystagmus. Left eye exhibits no nystagmus.  Neck: Normal range of motion. Neck supple.  Cardiovascular: Normal rate and regular rhythm.  No murmur heard. Pulses:      Dorsalis pedis pulses are 2+ on the right side, and 2+ on the left side.  Pulmonary/Chest: Effort normal and breath sounds normal. No respiratory distress. He has no wheezes. He has no rhonchi. He has no rales.  Abdominal: Soft. He exhibits no distension. There is no tenderness.  Musculoskeletal:  Patient has mild erythema and swelling over the right first MTP region dorsally.  No overlying warmth.  No obvious deformities.  Patient has normal range of motion to bilateral ankles and is able to move all digits including first toe. Tender to palpation over right 1st MTP. NVI distally.   Neurological: He is alert.  Clear speech.  No facial droop.  CN III through XII grossly intact.  Sensation grossly intact bilateral upper and lower extremities.  Patient has 5 out of 5 symmetric grip strength.  He has 5 out of 5 strength with plantar dorsiflexion bilaterally.  Normal finger-to-nose bilaterally.  Negative pronator drift.  Negative Romberg sign.  Gait is antalgic secondary to right foot pain.  Skin: Skin is warm and dry. No rash noted.  Psychiatric: He has a normal mood and affect. His behavior is normal.  Nursing note and  vitals reviewed.   ED Treatments / Results  Labs Results for orders placed or performed during the hospital encounter of 03/11/18  CBC with Differential  Result Value Ref Range   WBC 10.5 4.0 - 10.5 K/uL   RBC 5.04 4.22 - 5.81 MIL/uL   Hemoglobin 15.3 13.0 - 17.0 g/dL   HCT 16.148.4 09.639.0 - 04.552.0 %   MCV 96.0 78.0 - 100.0 fL   MCH 30.4 26.0 - 34.0 pg   MCHC 31.6 30.0 - 36.0 g/dL   RDW 40.913.6 81.111.5 - 91.415.5 %   Platelets 217 150 - 400 K/uL   Neutrophils Relative % 76 %   Neutro Abs 7.9 (H) 1.7 - 7.7 K/uL   Lymphocytes Relative 15 %   Lymphs Abs 1.6 0.7 - 4.0 K/uL   Monocytes Relative 8 %   Monocytes Absolute 0.9 0.1 - 1.0 K/uL   Eosinophils Relative 1 %   Eosinophils Absolute 0.1 0.0 - 0.7 K/uL   Basophils Relative 0 %   Basophils Absolute  0.0 0.0 - 0.1 K/uL   Immature Granulocytes 0 %   Abs Immature Granulocytes 0.0 0.0 - 0.1 K/uL  Basic metabolic panel  Result Value Ref Range   Sodium 139 135 - 145 mmol/L   Potassium 4.1 3.5 - 5.1 mmol/L   Chloride 104 101 - 111 mmol/L   CO2 27 22 - 32 mmol/L   Glucose, Bld 121 (H) 65 - 99 mg/dL   BUN 12 6 - 20 mg/dL   Creatinine, Ser 1.61 0.61 - 1.24 mg/dL   Calcium 9.2 8.9 - 09.6 mg/dL   GFR calc non Af Amer >60 >60 mL/min   GFR calc Af Amer >60 >60 mL/min   Anion gap 8 5 - 15  I-Stat Troponin, ED (not at [facility])  Result Value Ref Range   Troponin i, poc 0.00 0.00 - 0.08 ng/mL   Comment 3           No results found.  EKG EKG Interpretation  Date/Time:  Thursday March 11 2018 09:16:16 EDT Ventricular Rate:  58 PR Interval:    QRS Duration: 90 QT Interval:  442 QTC Calculation: 435 R Axis:   71 Text Interpretation:  Sinus rhythm no acute st/ts similar to prior 2/19 Confirmed by Meridee Score [id number]) on 03/11/2018 9:34:23 AM   Radiology No results found.  Procedures Procedures (including critical care time)  Medications Ordered in ED Medications - No data to display   Initial Impression / Assessment and Plan / ED Course  I  have reviewed the triage vital signs and the nursing notes.  Pertinent labs & imaging results that were available during my care of the patient were reviewed by me and considered in my medical decision making (see chart for details).   Patient presents with complaints of right great toe pain as well as dizziness.  Patient nontoxic-appearing, no apparent distress, initial vitals WNL with the exception of elevated blood pressure, doubt HTN emergency.  Regarding right toe pain: Patient with monoarticular pain, swelling, and erythema to location he has had gout previously, states this feels the same.  He is afebrile, no systemic symptoms or overlying warmth of this area to prompt concern for septic joint or cellulitis.  No history of injury to raise concern for fracture or dislocation.  Patient states he is received indomethacin for this with improvement previously.  Will provide prescription for indomethacin.  Regarding patient's dizziness: Patient describes this more as lightheadedness or feeling as if he stood up too quickly.  He has no neurologic deficits on exam. Will evaluate with labs, EKG, and orthostatic vitals. Will given 1L IV fluids. Labs fairly unremarkable. No significant electrolyte abnormalities, no anemia. EKG without significant change from previous, troponin WNL. Orthostatics are positive. RN informed me patient refusing IV.   10:20: RE-EVAL: Patient refusing IV for fluids and refusing to stay in the ER any longer for further evaluation/monitoring. The patient and I discussed the nature and purpose, risks and benefits, as well as, the alternatives of treatment. Time was given to allow the opportunity to ask questions and consider options. After the discussion, the patient decided to refuse the offerred treatment and leave AMA. The patient was informed that refusal could lead to, but was not limited to, death, permanent disability, or severe pain.  Prior to refusing, I determined that the  patient had the capacity to make their decision and understood the consequences of that decision. After refusal, I made every reasonable effort to treat them to the best of my  ability- Indomethacin prescription provided with recommendations for good hydration and PO intake.  The patient was notified that they may return to the emergency department at any time for further treatment.     Final Clinical Impressions(s) / ED Diagnoses   Final diagnoses:  Acute gout involving toe of right foot, unspecified cause  Dizziness  Orthostatic hypotension    ED Discharge Orders    None       Cherly Anderson, PA-C 03/11/18 1256    [address], [place], PA-C 03/11/18 1258    Terrilee Files, MD 03/12/18 [phone]

## 2018-03-11 NOTE — ED Notes (Signed)
Pt agitated, due to IV trying to be placed. Pt cursing at [employer]. Tech tried to explain that he needed to get the IV. Pt told Tech to "be quiet". Tech left room.

## 2018-03-11 NOTE — ED Provider Notes (Addendum)
[facility]    CSN: [id number] Arrival date & time: 03/11/18  1039     History   Chief Complaint Chief Complaint  Patient presents with  . Gout    HPI [name] is a 57 y.o. male history of schizophrenia, DM type II, bipolar type I presenting today for evaluation of right great toe pain.  He believes that he is having a gout flare.  States he has had gout for 40 years and it flares off and on, never nonspecific area.  Flare began this morning, denies any injury.  States that he typically has relief with indomethacin.  Patient was seen in the emergency room earlier today and prescribed indomethacin, it appears she left AMA and ripped this prescription up.  Patient had positive orthostatic vital signs, he refused IV hydration.  HPI  Past Medical History:  Diagnosis Date  . Alcoholic (HCC)   . Bipolar 1 disorder (HCC)   . Depression   . Diabetes mellitus without complication (HCC)   . Homeless   . Schizoaffective schizophrenia [facility](HCC)     Patient Active Problem List   Diagnosis Date Noted  . Alcoholic intoxication without complication (HCC)   . Suicidal thoughts   . MDD (major depressive disorder), recurrent episode, severe (HCC) 04/30/2017  . HTN (hypertension) 04/01/2016  . Homeless single person 04/01/2016  . Tobacco abuse disorder 04/01/2016    Past Surgical History:  Procedure Laterality Date  . DENTAL SURGERY     wisdom teeth       Home Medications    Prior to Admission medications   Medication Sig Start Date End Date Taking? Authorizing Provider  indomethacin (INDOCIN) 50 MG capsule Take 1 capsule (50 mg total) by mouth 3 (three) times daily with meals. 03/11/18   Jolyn Deshmukh C, PA-C  predniSONE (DELTASONE) 20 MG tablet Take 3 tablets (60 mg total) by mouth daily. Patient not taking: Reported on 12/02/2017 11/11/17   Cathren LaineSteinl, Kevin, MD    Family History No family history on file.  Social History Social History   Tobacco Use  .  Smoking status: Current Every Day Smoker    Packs/day: 2.00    Types: Cigarettes  . Smokeless tobacco: Never Used  Substance Use Topics  . Alcohol use: Yes  . Drug use: No     Allergies   Patient has no known allergies.   Review of Systems Review of Systems  Constitutional: Negative for activity change, appetite change, fatigue and fever.  Respiratory: Negative for chest tightness and shortness of breath.   Cardiovascular: Negative for chest pain.  Gastrointestinal: Negative for abdominal pain, nausea and vomiting.  Musculoskeletal: Positive for arthralgias, gait problem, joint swelling and myalgias.  Skin: Positive for color change. Negative for rash and wound.  Neurological: Negative for dizziness, weakness, light-headedness and headaches.     Physical Exam Triage Vital Signs ED Triage Vitals  Enc Vitals Group     BP 03/11/18 1051 103/71     Pulse Rate 03/11/18 1051 78     Resp 03/11/18 1051 18     Temp 03/11/18 1051 97.8 F (36.6 C)     Temp Source 03/11/18 1051 Oral     SpO2 03/11/18 1051 100 %     Weight --      Height --      Head Circumference --      Peak Flow --      Pain Score 03/11/18 1049 10     Pain Loc --  Pain Edu? --      Excl. in GC? --    No data found.  Updated Vital Signs BP 103/71 (BP Location: Left Arm) Comment (BP Location): small adult cuff, properly sized   Pulse 78   Temp 97.8 F (36.6 C) (Oral)   Resp 18   SpO2 100%   Visual Acuity Right Eye Distance:   Left Eye Distance:   Bilateral Distance:    Right Eye Near:   Left Eye Near:    Bilateral Near:     Physical Exam  Constitutional: He appears well-developed and well-nourished.  Patient seems slightly agitated, wanting to go home as soon as possible  HENT:  Head: Normocephalic and atraumatic.  Eyes: Conjunctivae are normal.  Neck: Neck supple.  Cardiovascular: Normal rate.  Pulmonary/Chest: Effort normal. No respiratory distress.  Musculoskeletal: He exhibits no  edema.  Right base of first toe with significant swelling, redness, mild warmth, dorsalis pedis 2+ ' Ambulating without abnormality  Neurological: He is alert.  Skin: Skin is warm and dry.  Psychiatric: He has a normal mood and affect.  Nursing note and vitals reviewed.    UC Treatments / Results  Labs (all labs ordered are listed, but only abnormal results are displayed) Labs Reviewed - No data to display  EKG None  Radiology No results found.  Procedures Procedures (including critical care time)  Medications Ordered in UC Medications - No data to display  Initial Impression / Assessment and Plan / UC Course  I have reviewed the triage vital signs and the nursing notes.  Pertinent labs & imaging results that were available during my care of the patient were reviewed by me and considered in my medical decision making (see chart for details).     Patient does appear to have an acute gout flare.  Prescription for indomethacin provided.  Advised him to return if his symptoms are not improving. Discussed strict return precautions. Patient verbalized understanding and is agreeable with plan.  Final Clinical Impressions(s) / UC Diagnoses   Final diagnoses:  Acute gout involving toe of right foot, unspecified cause     Discharge Instructions     Please use indomethacin for gout  Please return if not getting better   ED Prescriptions    Medication Sig Dispense Auth. Provider   indomethacin (INDOCIN) 50 MG capsule Take 1 capsule (50 mg total) by mouth 3 (three) times daily with meals. 50 capsule Devonia Farro C, PA-C     Controlled Substance Prescriptions [place] Controlled Substance Registry consulted? Not Applicable   Lew Dawes, PA-C 03/11/18 1113    Amed Datta, Logan C, [place] 03/11/18 1114

## 2018-03-11 NOTE — Discharge Instructions (Signed)
You were seen in the emergency department today for a gout flare and dizziness.  We are sending you home with indomethacin medicine to treat your gout. Indomethacin is a nonsteroidal anti-inflammatory medication that will help with pain and swelling. Be sure to take this medication as prescribed with food, 1 pill three times per day,  It should be taken with food, as it can cause stomach upset, and more seriously, stomach bleeding. Do not take other nonsteroidal anti-inflammatory medications with this such as Advil, Motrin, aleve, naproxen, etc.   We have prescribed you new medication(s) today. Discuss the medications prescribed today with your pharmacist as they can have adverse effects and interactions with your other medicines including over the counter and prescribed medications. Seek medical evaluation if you start to experience new or abnormal symptoms after taking one of these medicines, seek care immediately if you start to experience difficulty breathing, feeling of your throat closing, facial swelling, or rash as these could be indications of a more serious allergic reaction   We suspect that her dizziness is related to being dehydrated, we offered for you to stay in the emergency department for observation and for fluids however you declined.  Please be sure to intake plenty of fluids and food throughout the day today.  You may return to the emergency department at any time for reevaluation and further management, return to the ER for new or worsening symptoms or any other concerns that you may have.

## 2018-03-11 NOTE — ED Notes (Signed)
Pt refused to get into a gown.

## 2018-03-11 NOTE — ED Notes (Signed)
Edp aware patient refusing Iv at this time and requesting to see edp

## 2018-03-11 NOTE — ED Notes (Signed)
Risks of leaving AMA explained to patient. Patient refusing to sign ama form.

## 2018-03-11 NOTE — Discharge Instructions (Signed)
Please use indomethacin for gout  Please return if not getting better

## 2018-03-11 NOTE — ED Notes (Signed)
Brought patient his discharge paperwork.  While attempting to explain his instructions and rationale for EDP not wanting him to leave, and that he is leaving against medical advice  patient takes papers from RN and tears off prescription and walks out

## 2018-03-11 NOTE — ED Notes (Addendum)
Patient refusing to put gown on, and refusing to remove shoe for assessment. Also refusing neuro assessment no cooperating

## 2018-03-11 NOTE — ED Triage Notes (Signed)
Pt arrives from home with c/o right great toe pain since this am. Also c/o diozziness since this am.
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
## 2018-03-11 NOTE — ED Notes (Signed)
Patient requesting to see edp. EDP aware

## 2018-03-12 ENCOUNTER — Encounter: Payer: Self-pay | Admitting: Pediatric Intensive Care

## 2018-03-12 MED FILL — INDOMETHACIN 50 MG CAPSULE: 50 | 16 days supply | Qty: 50 | Fill #0

## 2018-03-25 ENCOUNTER — Emergency Department (HOSPITAL_COMMUNITY)
Admission: EM | Admit: 2018-03-25 | Discharge: 2018-03-25 | Disposition: A | Discharge status: Home / Self Care | Payer: Medicaid Other | Attending: Emergency Medicine | Admitting: Emergency Medicine

## 2018-03-25 ENCOUNTER — Other Ambulatory Visit: Payer: Self-pay

## 2018-03-25 ENCOUNTER — Encounter (HOSPITAL_COMMUNITY): Payer: Self-pay | Admitting: Emergency Medicine

## 2018-03-25 DIAGNOSIS — M25511 Pain in right shoulder: Secondary | ICD-10-CM

## 2018-03-25 DIAGNOSIS — S8002XA Contusion of left knee, initial encounter: Secondary | ICD-10-CM | POA: Diagnosis not present

## 2018-03-25 DIAGNOSIS — F17210 Nicotine dependence, cigarettes, uncomplicated: Secondary | ICD-10-CM | POA: Insufficient documentation

## 2018-03-25 DIAGNOSIS — S50312A Abrasion of left elbow, initial encounter: Secondary | ICD-10-CM | POA: Diagnosis not present

## 2018-03-25 DIAGNOSIS — Y999 Unspecified external cause status: Secondary | ICD-10-CM | POA: Diagnosis not present

## 2018-03-25 DIAGNOSIS — Y929 Unspecified place or not applicable: Secondary | ICD-10-CM | POA: Insufficient documentation

## 2018-03-25 DIAGNOSIS — M25562 Pain in left knee: Secondary | ICD-10-CM | POA: Diagnosis present

## 2018-03-25 DIAGNOSIS — Y939 Activity, unspecified: Secondary | ICD-10-CM | POA: Insufficient documentation

## 2018-03-25 DIAGNOSIS — E119 Type 2 diabetes mellitus without complications: Secondary | ICD-10-CM | POA: Diagnosis not present

## 2018-03-25 DIAGNOSIS — I10 Essential (primary) hypertension: Secondary | ICD-10-CM | POA: Insufficient documentation

## 2018-03-25 MED ORDER — IBUPROFEN 800 MG PO TABS: 800.0000 mg | ORAL_TABLET | Freq: Three times a day (TID) | ORAL | 0 refills | Status: DC | PRN

## 2018-03-25 MED ORDER — IBUPROFEN 800 MG PO TABS
800.0000 mg | ORAL_TABLET | Freq: Once | ORAL | Status: AC
  Administered 2018-03-25: 800 mg via ORAL
  Filled 2018-03-25: qty 1
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
## 2018-03-25 NOTE — ED Notes (Signed)
Pt refused vitals 

## 2018-03-31 NOTE — Congregational Nurse Program (Signed)
Congregational Nurse Program Note  Date of Encounter: 03/12/2018  Past Medical History: Past Medical History:  Diagnosis Date  . Alcoholic (HCC)   . Bipolar 1 disorder (HCC)   . Depression   . Diabetes mellitus without complication (HCC)   . Homeless   . Schizoaffective schizophrenia [facility](HCC)     Encounter Details: CNP Questionnaire - 03/12/18 1015      Questionnaire   Patient Status  Not Applicable    Race  White or Caucasian    Location Patient Served At  [facility]    Insurance  Medicaid    Uninsured  Not Applicable    Food  No food insecurities    Housing/Utilities  No permanent housing    Transportation  No transportation needs    Interpersonal Safety  Yes, feel physically and emotionally safe where you currently live    Medication  Yes, have medication insecurities;Provided medication assistance    Medical Provider  Yes    Referrals  Medication Assistance    ED Visit Averted  Not Applicable    Life-Saving Intervention Made  Not Applicable     Seen in ED yesterday due to acute gout episode. Needs prescription for indocin filled.

## 2018-07-09 IMAGING — DX DG CHEST 2V
2 series · 2 of 2 positions shown · non-contrast
Comparison: 10/19/2017.

CLINICAL DATA: Cough and SOB.

EXAM:
CHEST  2 VIEW

[chest pa]
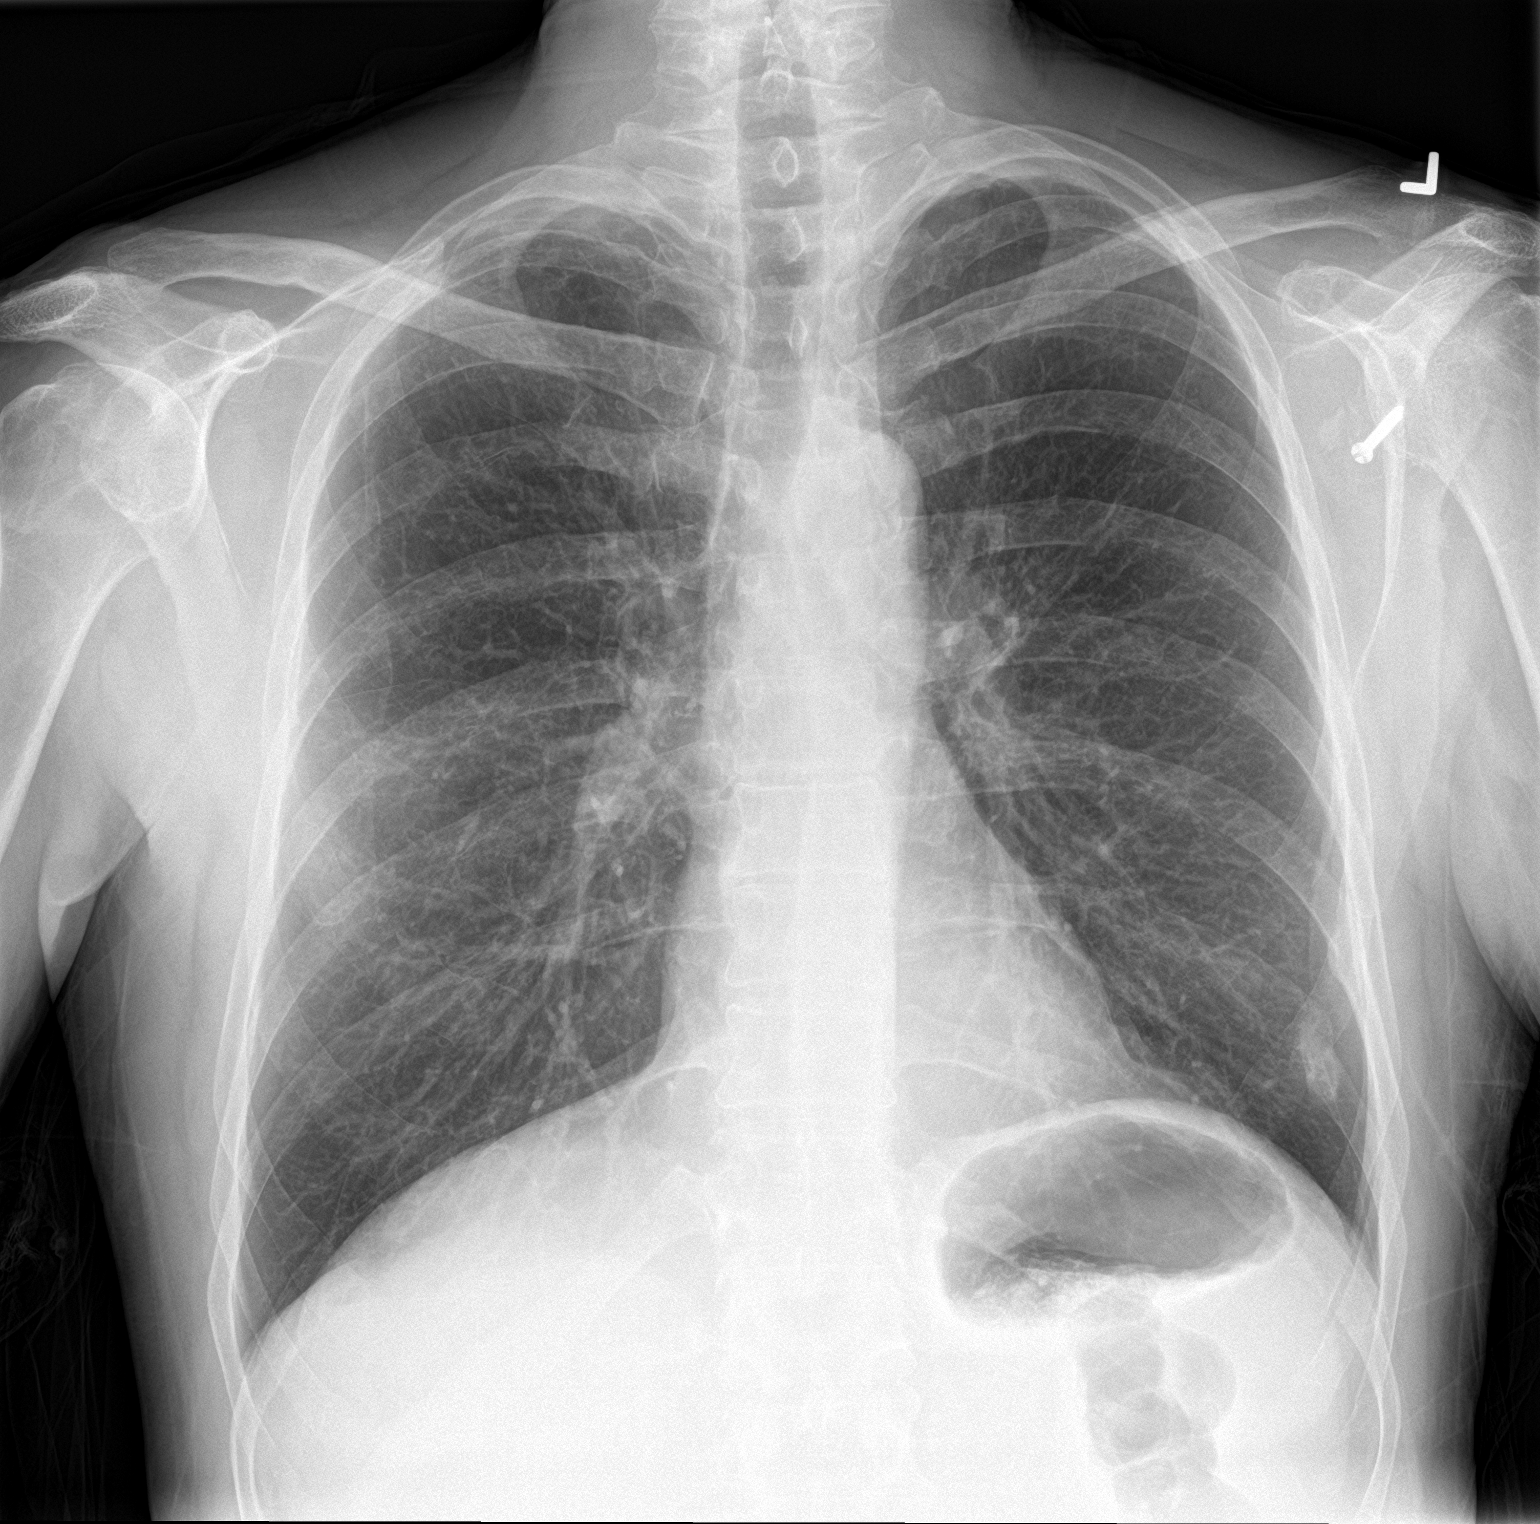

[chest lat]
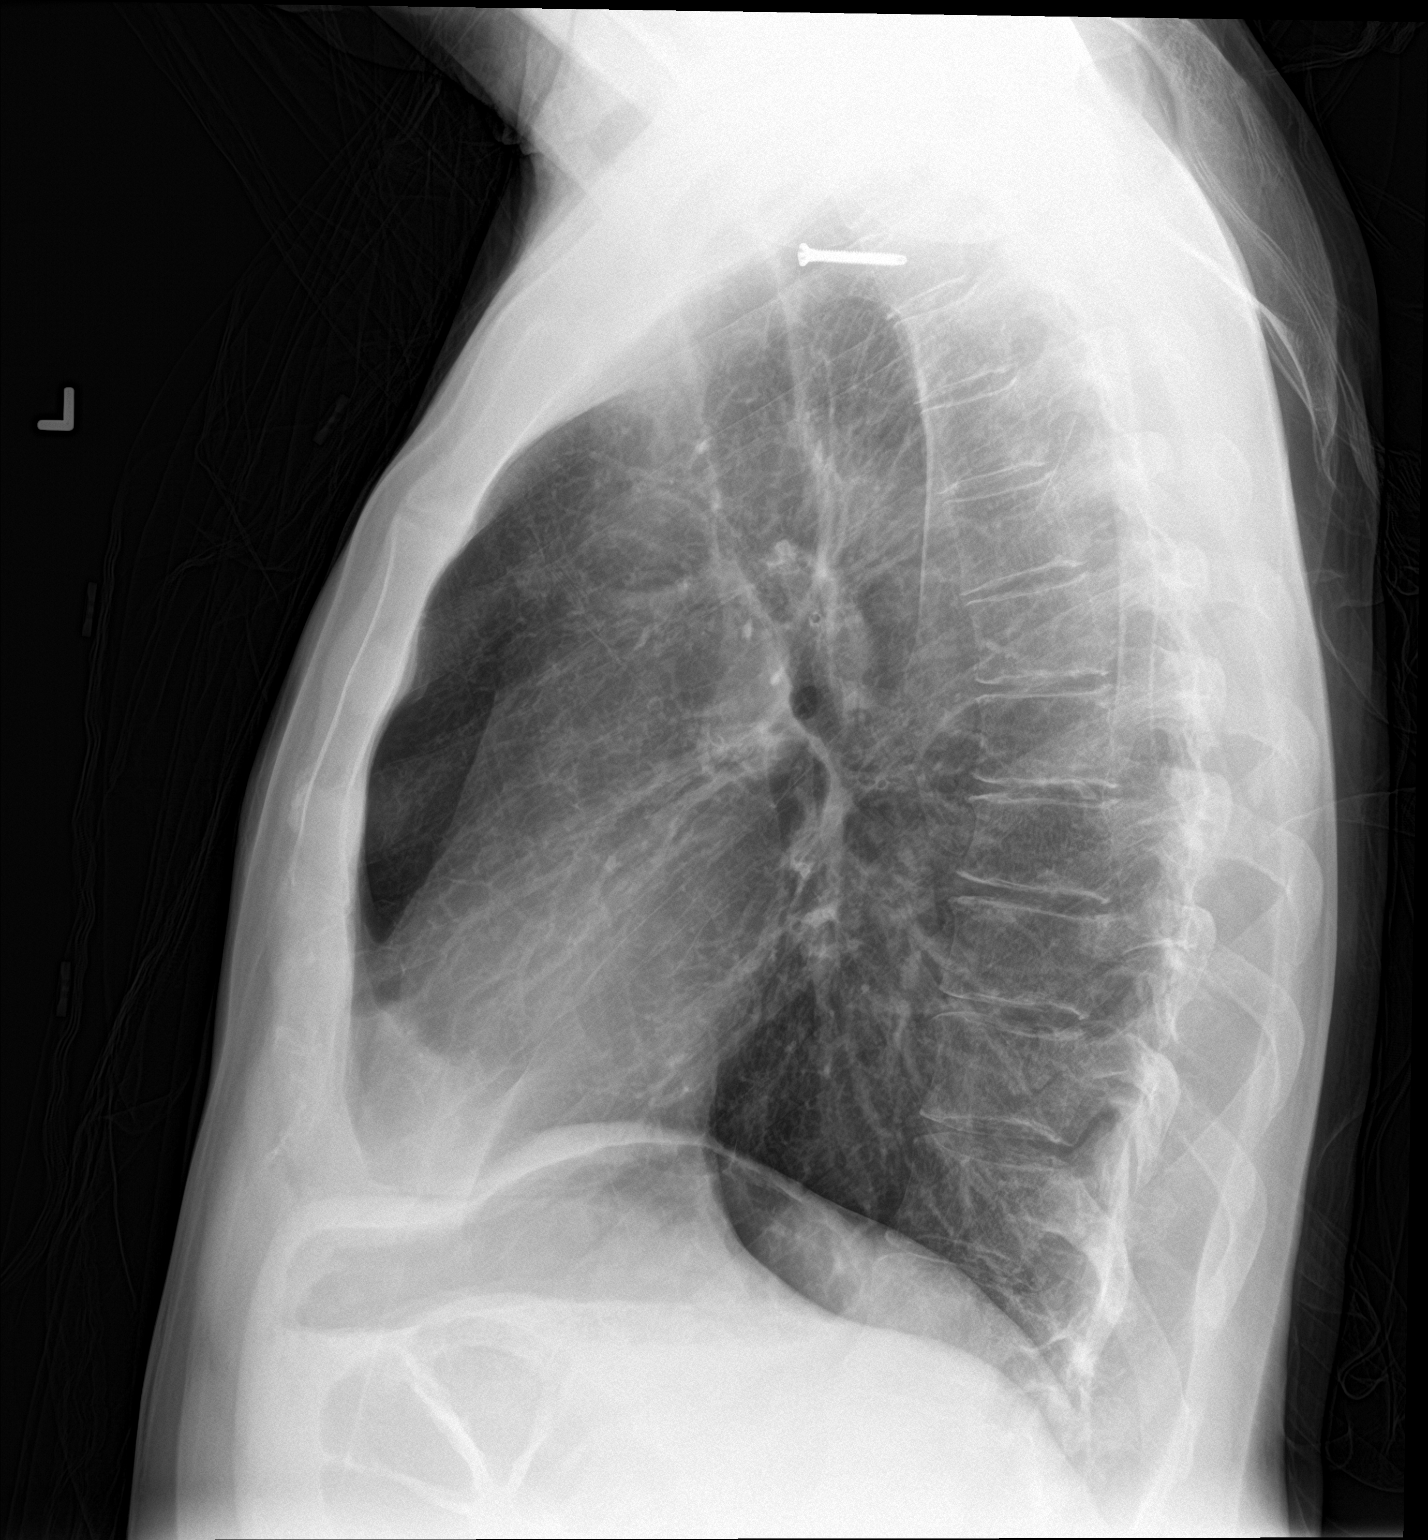

[2 of 2 positions shown; findings below may reference images not displayed]

FINDINGS: The heart size and mediastinal contours are within normal limits.
Both lungs are clear. Chronic left posterior rib deformities are
again noted.
IMPRESSION: No active cardiopulmonary disease.

## 2018-08-13 IMAGING — DX DG CHEST 2V
2 series · 2 of 2 positions shown · non-contrast
Comparison: November 11, 2017

CLINICAL DATA: Cough

EXAM:
CHEST - 2 VIEW

[chest pa]
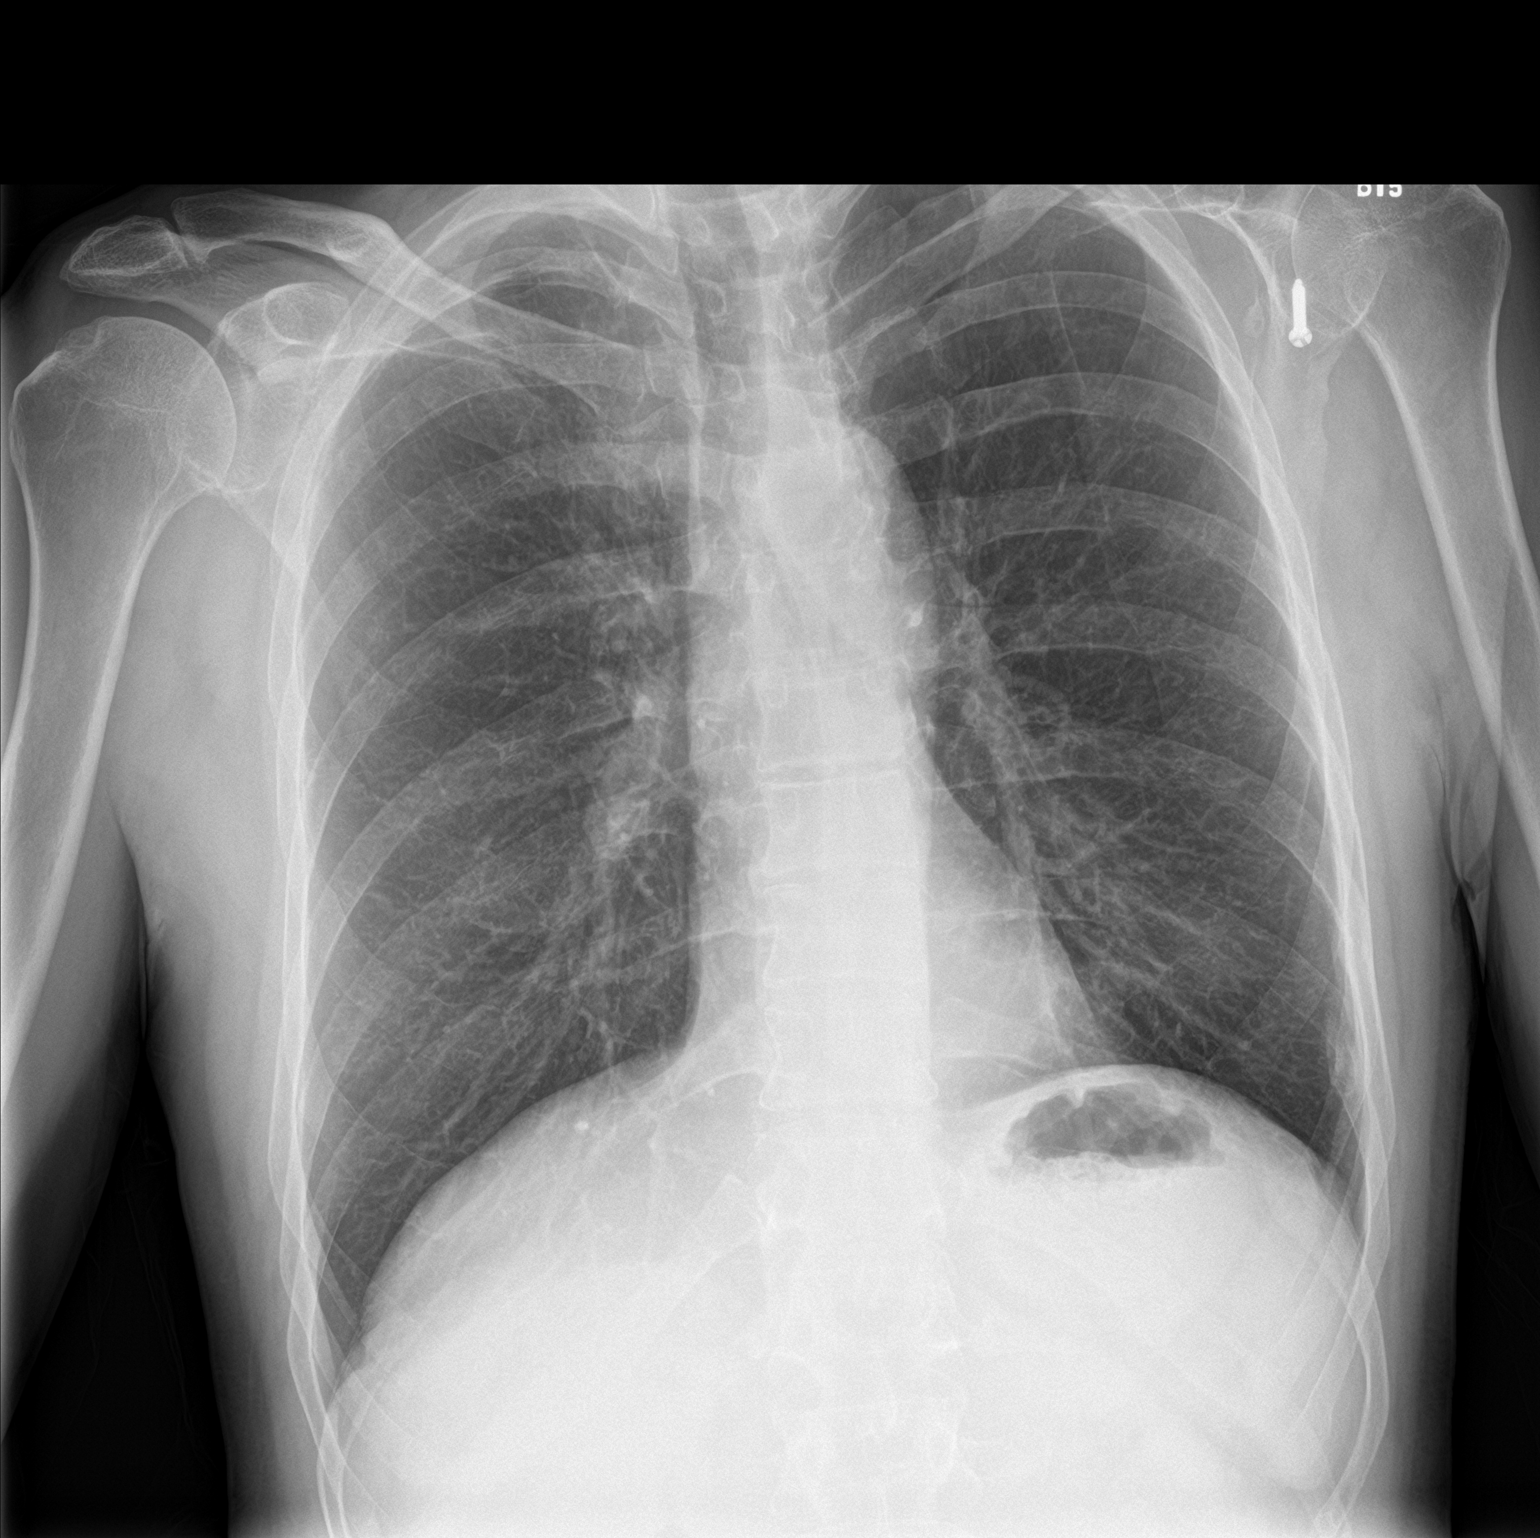

[chest lat]
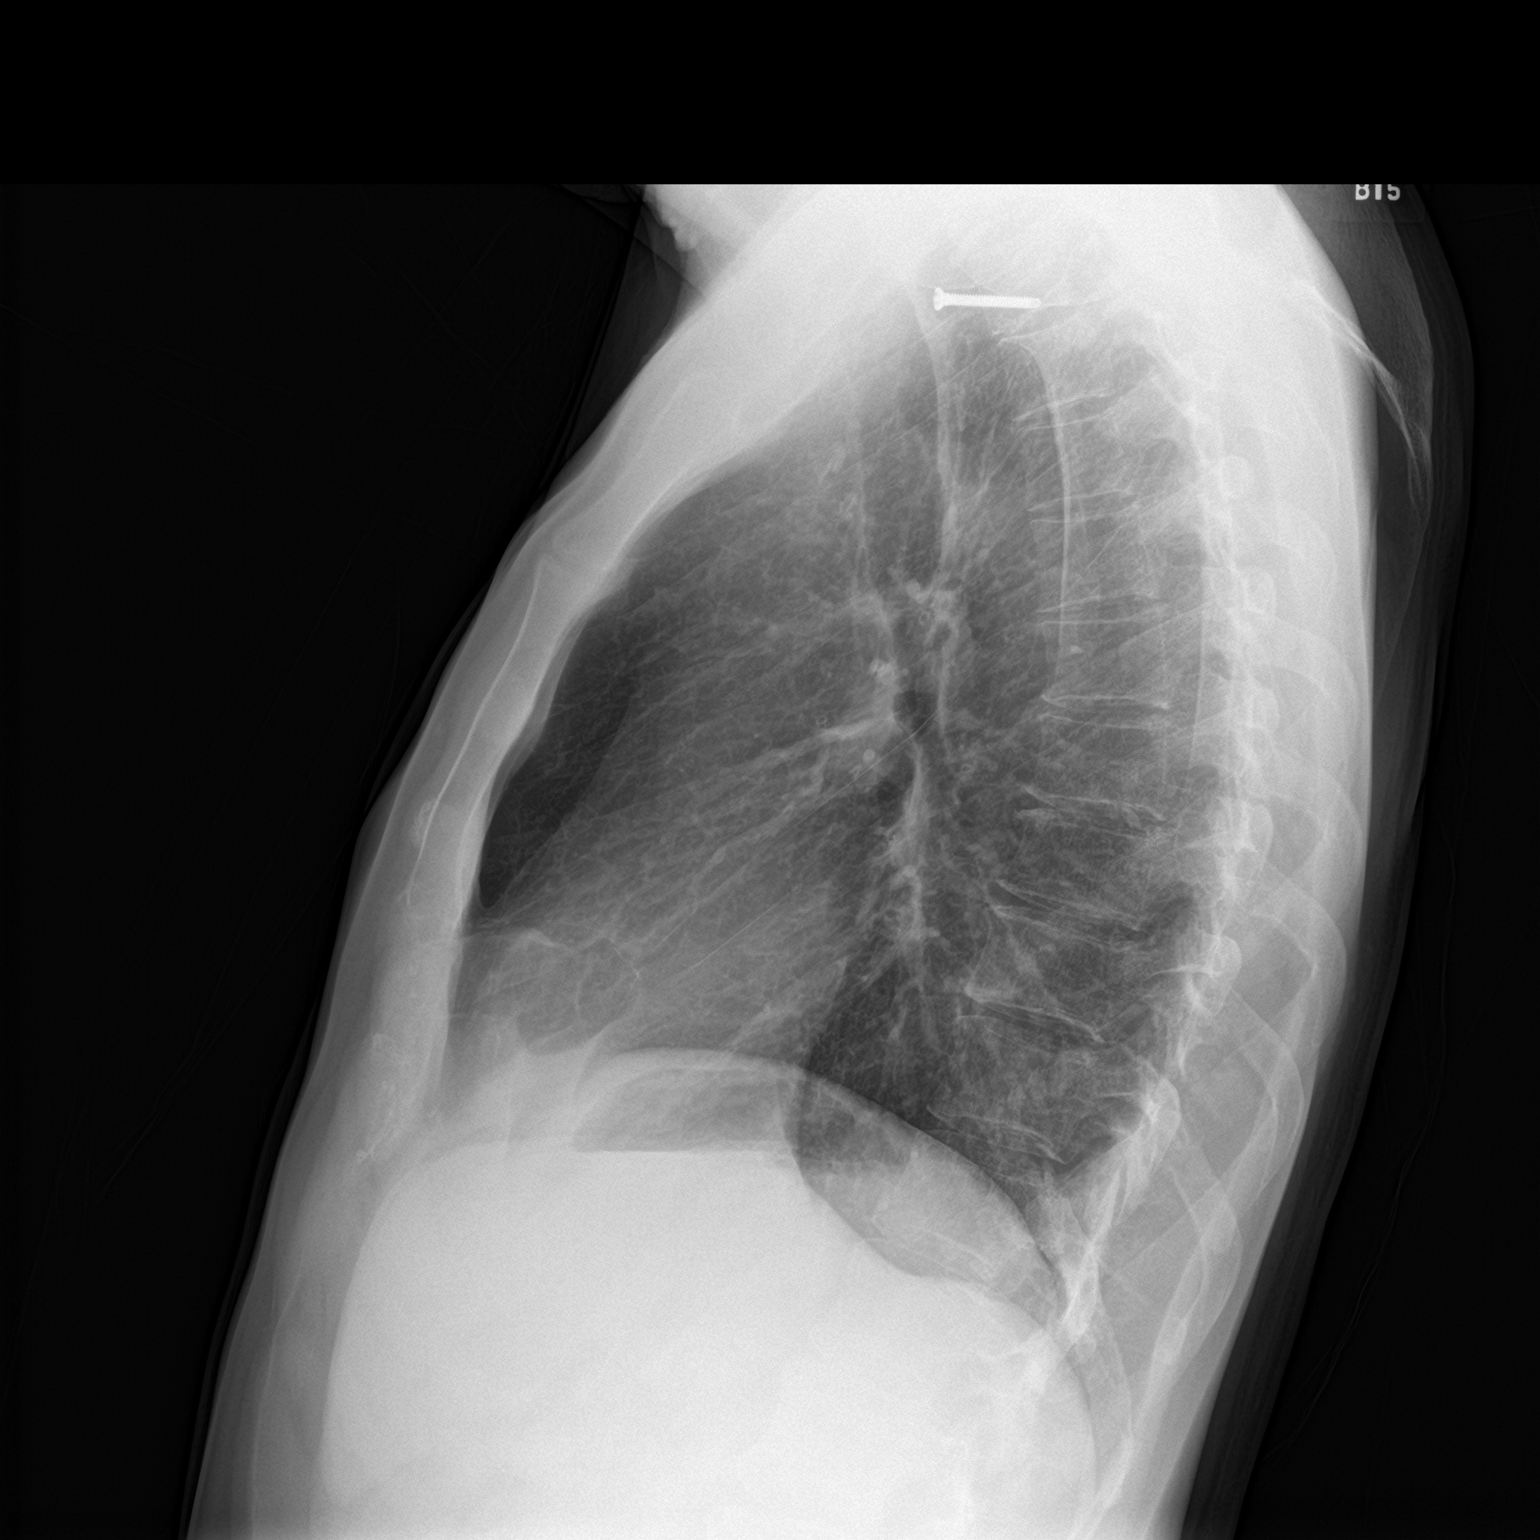

[2 of 2 positions shown; findings below may reference images not displayed]

FINDINGS: There is no edema or consolidation. The heart size and pulmonary
vascularity are normal. No adenopathy. There is postoperative change
in left shoulder. There old healed rib fractures on the left
inferolaterally.
IMPRESSION: No edema or consolidation.

## 2018-12-16 ENCOUNTER — Telehealth: Payer: Medicaid Other | Admitting: Nurse Practitioner

## 2018-12-16 DIAGNOSIS — R059 Cough, unspecified: Secondary | ICD-10-CM

## 2018-12-16 DIAGNOSIS — R05 Cough: Secondary | ICD-10-CM

## 2018-12-16 NOTE — Progress Notes (Signed)
  E-Visit for Corona Virus Screening  Based on what you have shared with me, you need to seek an evaluation for a severe illness that is causing your symptoms which may be coronavirus or some other illness. I recommend that you be seen and evaluated "face to face". Our Emergency Departments are best equipped to handle patients with severe symptoms.   I recommend the following:  . If you are having a true medical emergency please call 911. . If you are considered high risk for Corona virus because of a known exposure, fever, shortness of breath and cough, OR if you have severe symptoms of any kind, seek medical care at an emergency room.  . Please call ahead and tell them that you were seen by telemedicine and they have recommended that you have a face to face evaluation. . [facility] Emergency Department [address], [place], [place] [address] [phone]  . [facility] [place] Emergency Department [address], [place], [place] [address] [phone]  . [facility] Emergency Department [address], [place], [place] [address] [phone]  . [facility] Emergency Department [address], [place], [place] [address] [phone]  . [facility] Emergency Department [address], [place],  [address] [phone]  NOTE: If you entered your credit card information for this eVisit, you will not be charged. You may see a "hold" on your card for the $35 but that hold will drop off and you will not have a charge processed.   Your e-visit answers were reviewed by a board certified advanced clinical practitioner to complete your personal care plan.  Thank you for using e-Visits.  

## 2018-12-17 NOTE — Progress Notes (Signed)
  E-Visit for Corona Virus Screening  Based on what you have shared with me, you need to seek an evaluation for a severe illness that is causing your symptoms which may be coronavirus or some other illness. I recommend that you be seen and evaluated "face to face". Our Emergency Departments are best equipped to handle patients with severe symptoms.   I recommend the following:  . If you are having a true medical emergency please call 911. . If you are considered high risk for Corona virus because of a known exposure, fever, shortness of breath and cough, OR if you have severe symptoms of any kind, seek medical care at an emergency room.  . Please call ahead and tell them that you were seen by telemedicine and they have recommended that you have a face to face evaluation. . [facility] Emergency Department [address], [place], [place] [address] [phone]  . [facility] [place] Emergency Department [address], [place], [place] [address] [phone]  . [facility] Emergency Department [address], [place], [place] [address] [phone]  . [facility] Emergency Department [address], [place], [place] [address] [phone]  . [facility] Emergency Department [address], [place], [place] [address] [phone]  NOTE: If you entered your credit card information for this eVisit, you will not be charged. You may see a "hold" on your card for the $35 but that hold will drop off and you will not have a charge processed.   Your e-visit answers were reviewed by a board certified advanced clinical practitioner to complete your personal care plan.  Thank you for using e-Visits.  

## 2023-03-24 ENCOUNTER — Other Ambulatory Visit: Payer: Self-pay | Admitting: Internal Medicine

## 2023-03-24 LAB — CBC
Hemoglobin: 15.1 g/dL (ref 13.2–17.1)
MCV: 91.1 fL (ref 80.0–100.0)
MPV: 10 fL (ref 7.5–12.5)
Platelets: 267 10*3/uL (ref 140–400)
WBC: 8.6 10*3/uL (ref 3.8–10.8)

## 2023-03-25 LAB — COMPLETE METABOLIC PANEL WITH GFR
ALT: 9 U/L (ref 9–46)
Alkaline phosphatase (APISO): 81 U/L (ref 35–144)
CO2: 21 mmol/L (ref 20–32)
Calcium: 9.3 mg/dL (ref 8.6–10.3)
Total Bilirubin: 0.4 mg/dL (ref 0.2–1.2)
eGFR: 77 mL/min/{1.73_m2} (ref >=60)

## 2023-03-25 LAB — LIPID PANEL
Cholesterol: 264 mg/dL — ABNORMAL HIGH (ref <200)
Non-HDL Cholesterol (Calc): 231 mg/dL (calc) — ABNORMAL HIGH (ref <130)

## 2023-03-27 LAB — COMPLETE METABOLIC PANEL WITH GFR
AG Ratio: 1.7 (calc) (ref 1.0–2.5)
AST: 15 U/L (ref 10–35)
Albumin: 4.5 g/dL (ref 3.6–5.1)
BUN: 19 mg/dL (ref 7–25)
Chloride: 106 mmol/L (ref 98–110)
Creat: 1.09 mg/dL (ref 0.70–1.35)
Globulin: 2.6 g/dL (calc) (ref 1.9–3.7)
Glucose, Bld: 75 mg/dL (ref 65–99)
Potassium: 3.9 mmol/L (ref 3.5–5.3)
Sodium: 141 mmol/L (ref 135–146)
Total Protein: 7.1 g/dL (ref 6.1–8.1)

## 2023-03-27 LAB — URIC ACID: Uric Acid, Serum: 7.7 mg/dL (ref 4.0–8.0)

## 2023-03-27 LAB — CBC
HCT: 45.1 % (ref 38.5–50.0)
MCH: 30.5 pg (ref 27.0–33.0)
MCHC: 33.5 g/dL (ref 32.0–36.0)
RBC: 4.95 10*6/uL (ref 4.20–5.80)
RDW: 13.7 % (ref 11.0–15.0)

## 2023-03-27 LAB — TSH: TSH: 7.93 mIU/L — ABNORMAL HIGH (ref 0.40–4.50)

## 2023-03-27 LAB — LIPID PANEL
HDL: 33 mg/dL — ABNORMAL LOW (ref >=40)
Total CHOL/HDL Ratio: 8 (calc) — ABNORMAL HIGH (ref <5.0)
Triglycerides: 422 mg/dL — ABNORMAL HIGH (ref <150)

## 2023-03-27 LAB — T4, FREE: Free T4: 1.3 ng/dL (ref 0.8–1.8)

## 2023-03-27 LAB — T3 UPTAKE: T3 Uptake: 31 % (ref 22–35)

## 2023-03-27 LAB — T3: T3, Total: 150 ng/dL (ref 76–181)

## 2023-03-27 LAB — PSA: PSA: 0.63 ng/mL (ref <=4.00)

## 2023-03-27 LAB — VITAMIN D 25 HYDROXY (VIT D DEFICIENCY, FRACTURES): Vit D, 25-Hydroxy: 28 ng/mL — ABNORMAL LOW (ref 30–100)

## 2023-05-12 ENCOUNTER — Telehealth: Payer: Self-pay

## 2023-05-13 ENCOUNTER — Encounter: Payer: Self-pay | Admitting: Pulmonary Disease

## 2023-05-13 ENCOUNTER — Ambulatory Visit (INDEPENDENT_AMBULATORY_CARE_PROVIDER_SITE_OTHER): Payer: MEDICAID | Admitting: Pulmonary Disease

## 2023-05-13 VITALS — BP 80/60 | HR 102 | Ht 72.0 in | Wt 174.6 lb

## 2023-05-13 DIAGNOSIS — Z720 Tobacco use: Secondary | ICD-10-CM

## 2023-05-13 DIAGNOSIS — R911 Solitary pulmonary nodule: Secondary | ICD-10-CM | POA: Diagnosis not present

## 2023-05-13 DIAGNOSIS — R918 Other nonspecific abnormal finding of lung field: Secondary | ICD-10-CM

## 2023-05-13 DIAGNOSIS — F17210 Nicotine dependence, cigarettes, uncomplicated: Secondary | ICD-10-CM

## 2023-05-13 NOTE — Telephone Encounter (Signed)
Made in error

## 2023-05-13 NOTE — Progress Notes (Signed)
Synopsis: Referred in Aug 2024 for lung nodule by [name], MD  Subjective:   PATIENT ID: [name] GENDER: male DOB: May 08, 1961, MRN: [id number]  Chief Complaint  Patient presents with   Consult    Consult for lung nodule.    This is a 62 year old gentleman, past medical history of alcohol use, has prior psychiatric disease.  He is a current every day smoker.Patient was referred after having a lung cancer screening CT.  This was completed on 04/24/2023.  Patient had an 8 x 6 mm lung nodule in the left upper lobe found on a lung cancer screening done at [facility].  Patient was referred for evaluation of pulmonary nodule.  At his max was smoking 2 packs/day.  He has smoked for 30+ years.     Past Medical History:  Diagnosis Date   Alcoholic (HCC)    Bipolar 1 disorder (HCC)    Depression    Diabetes mellitus without complication (HCC)    Homeless    Schizoaffective schizophrenia (HCC)      No family history on file.   Past Surgical History:  Procedure Laterality Date   DENTAL SURGERY     wisdom teeth    Social History   Socioeconomic History   Marital status: Single    Spouse name: Not on file   Number of children: Not on file   Years of education: Not on file   Highest education level: Not on file  Occupational History   Not on file  Tobacco Use   Smoking status: Every Day    Current packs/day: 2.00    Types: Cigarettes   Smokeless tobacco: Never   Tobacco comments:    7 packs of cigarettes in a week. 05/13/23 [place]  Vaping Use   Vaping status: Never Used  Substance and Sexual Activity   Alcohol use: Yes   Drug use: No   Sexual activity: Not on file  Other Topics Concern   Not on file  Social History Narrative   Not on file   Social Determinants of Health   Financial Resource Strain: Not on file  Food Insecurity: Not on file  Transportation Needs: Not on file  Physical Activity: Not on file  Stress: Not on file  Social  Connections: Unknown (04/23/2023)   Received from [facility]   Social Network    Social Network: Not on file  Intimate Partner Violence: Unknown (04/23/2023)   Received from [facility]   HITS    Physically Hurt: Not on file    Insult or Talk Down To: Not on file    Threaten Physical Harm: Not on file    Scream or Curse: Not on file     No Known Allergies   Outpatient Medications Prior to Visit  Medication Sig Dispense Refill   cetirizine (ZYRTEC) 10 MG tablet Take 10 mg by mouth daily.     ibuprofen (ADVIL,MOTRIN) 800 MG tablet Take 1 tablet (800 mg total) by mouth every 8 (eight) hours as needed. 21 tablet 0   indomethacin (INDOCIN) 50 MG capsule Take 1 capsule (50 mg total) by mouth 3 (three) times daily with meals. 50 capsule 2   predniSONE (DELTASONE) 20 MG tablet Take 3 tablets (60 mg total) by mouth daily. (Patient not taking: Reported on 12/02/2017) 12 tablet 0   No facility-administered medications prior to visit.    Review of Systems  Constitutional:  Negative for chills, fever, malaise/fatigue and weight loss.  HENT:  Negative  for hearing loss, sore throat and tinnitus.   Eyes:  Negative for blurred vision and double vision.  Respiratory:  Negative for cough, hemoptysis, sputum production, shortness of breath, wheezing and stridor.   Cardiovascular:  Negative for chest pain, palpitations, orthopnea, leg swelling and PND.  Gastrointestinal:  Negative for abdominal pain, constipation, diarrhea, heartburn, nausea and vomiting.  Genitourinary:  Negative for dysuria, hematuria and urgency.  Musculoskeletal:  Negative for joint pain and myalgias.  Skin:  Negative for itching and rash.  Neurological:  Negative for dizziness, tingling, weakness and headaches.  Endo/Heme/Allergies:  Negative for environmental allergies. Does not bruise/bleed easily.  Psychiatric/Behavioral:  Negative for depression. The patient is not nervous/anxious and does not have insomnia.   All other  systems reviewed and are negative.    Objective:  Physical Exam Vitals reviewed.  Constitutional:      General: He is not in acute distress.    Appearance: He is well-developed.  HENT:     Head: Normocephalic and atraumatic.  Eyes:     General: No scleral icterus.    Conjunctiva/sclera: Conjunctivae normal.     Pupils: Pupils are equal, round, and reactive to light.  Neck:     Vascular: No JVD.     Trachea: No tracheal deviation.  Cardiovascular:     Rate and Rhythm: Normal rate and regular rhythm.     Heart sounds: Normal heart sounds. No murmur heard. Pulmonary:     Effort: Pulmonary effort is normal. No tachypnea, accessory muscle usage or respiratory distress.     Breath sounds: Normal breath sounds. No stridor. No wheezing, rhonchi or rales.  Abdominal:     General: Bowel sounds are normal. There is no distension.     Palpations: Abdomen is soft.     Tenderness: There is no abdominal tenderness.  Musculoskeletal:        General: No tenderness.     Cervical back: Neck supple.  Lymphadenopathy:     Cervical: No cervical adenopathy.  Skin:    General: Skin is warm and dry.     Capillary Refill: Capillary refill takes less than 2 seconds.     Findings: No rash.  Neurological:     Mental Status: He is alert and oriented to person, place, and time.  Psychiatric:        Behavior: Behavior normal.     Vitals:   05/13/23 1338  BP: (!) 80/60  Pulse: (!) 102  SpO2: 97%  Weight: 174 lb 9.6 oz (79.2 kg)  Height: 6' (1.829 m)   97% on RA BMI Readings from Last 3 Encounters:  05/13/23 23.68 kg/m  03/11/18 19.79 kg/m  11/11/17 21.02 kg/m   Wt Readings from Last 3 Encounters:  05/13/23 174 lb 9.6 oz (79.2 kg)  03/11/18 150 lb (68 kg)  11/11/17 155 lb (70.3 kg)     CBC    Component Value Date/Time   WBC 8.6 03/24/2023 0000   RBC 4.95 03/24/2023 0000   HGB 15.1 03/24/2023 0000   HCT 45.1 03/24/2023 0000   HCT 39.7 05/03/2017 1745   PLT 267 03/24/2023  0000   MCV 91.1 03/24/2023 0000   MCH 30.5 03/24/2023 0000   MCHC 33.5 03/24/2023 0000   RDW 13.7 03/24/2023 0000   LYMPHSABS 1.6 03/11/2018 0903   MONOABS 0.9 03/11/2018 0903   EOSABS 0.1 03/11/2018 0903   BASOSABS 0.0 03/11/2018 1610     Chest Imaging:  Lung cancer screening CT completed at [facility]. Small 8 mm  left upper lobe pulmonary nodule. The patient's images have been independently reviewed by me.    Pulmonary Functions Testing Results:     No data to display          FeNO:   Pathology:   Echocardiogram:   Heart Catheterization:     Assessment & Plan:     ICD-10-CM   1. Lung nodule  R91.1 NM PET Image Initial (PI) Skull Base To Thigh (F-18 FDG)    2. Tobacco abuse disorder  Z72.0     3. Abnormal CT lung screening  R91.8       Discussion: This is a 62 year old gentleman with past medical history of tobacco use, abnormal lung cancer screening CT.  Plan: Based on the size of lesion of the gets important that we have follow-up CT imaging and further a restratification with nuclear medicine PET scan. We will order a PET scan and have him follow-up with [place] in 3 to 4 weeks after it has been complete.   Current Outpatient Medications:    cetirizine (ZYRTEC) 10 MG tablet, Take 10 mg by mouth daily., Disp: , Rfl:    ibuprofen (ADVIL,MOTRIN) 800 MG tablet, Take 1 tablet (800 mg total) by mouth every 8 (eight) hours as needed., Disp: 21 tablet, Rfl: 0   indomethacin (INDOCIN) 50 MG capsule, Take 1 capsule (50 mg total) by mouth 3 (three) times daily with meals., Disp: 50 capsule, Rfl: 2   predniSONE (DELTASONE) 20 MG tablet, Take 3 tablets (60 mg total) by mouth daily. (Patient not taking: Reported on 12/02/2017), Disp: 12 tablet, Rfl: 0   [name], DO [facility] 05/13/2023 2:45 PM

## 2023-05-13 NOTE — Patient Instructions (Signed)
Thank you for visiting Dr. Tonia Brooms at [facility]. Today we recommend the following:  Orders Placed This Encounter  Procedures   NM PET Image Initial (PI) Skull Base To Thigh (F-18 FDG)    Return in about 4 weeks (around 06/10/2023) for with Kandice Robinsons, NP, or Dr. Tonia Brooms, after PET/CT Chest.    Please do your part to reduce the spread of COVID-19.

## 2023-05-13 NOTE — Progress Notes (Signed)
Smoking Cessation Counseling:   The patient's current tobacco use: 1 pack for 45 years The patient was advised to quit and impact of smoking on their health.  I assessed the patient's willingness to attempt to quit. I provided methods and skills for cessation. We reviewed medication management of smoking session drugs if appropriate. Resources to help quit smoking were provided. A smoking cessation quit date was set: not willing to commit Follow-up was arranged in our clinic.  The amount of time spent counseling patient was 4 mins    [name], DO [facility] 05/13/2023 2:03 PM

## 2023-05-14 ENCOUNTER — Ambulatory Visit (HOSPITAL_COMMUNITY)
Admission: RE | Admit: 2023-05-14 | Discharge: 2023-05-14 | Disposition: A | Discharge status: Home / Self Care | Payer: MEDICAID | Source: Ambulatory Visit | Attending: Pulmonary Disease | Admitting: Pulmonary Disease

## 2023-05-14 DIAGNOSIS — R911 Solitary pulmonary nodule: Secondary | ICD-10-CM | POA: Insufficient documentation

## 2023-05-14 LAB — GLUCOSE, CAPILLARY: Glucose-Capillary: 97 mg/dL (ref 70–99)

## 2023-05-14 MED ORDER — FLUDEOXYGLUCOSE F - 18 (FDG) INJECTION
8.6600 | Freq: Once | INTRAVENOUS | Status: AC
  Administered 2023-05-14: 8.66 via INTRAVENOUS

## 2023-05-28 NOTE — Progress Notes (Signed)
[name],   This patient needs PFTs complete and to see me or SG after PFTs. They are for surgical eval. I have reviewed pet scan and I am concerned for possible small lung cancer. Please call patient and get this arranged.  Thanks,  BLI  [name], DO [facility] 05/28/2023 8:31 AM

## 2023-05-29 ENCOUNTER — Telehealth: Payer: Self-pay | Admitting: Pulmonary Disease

## 2023-05-29 DIAGNOSIS — R911 Solitary pulmonary nodule: Secondary | ICD-10-CM

## 2023-05-29 NOTE — Telephone Encounter (Signed)
Per Dr. Tonia Brooms, patient needs PFTs prior to office visit. PCC's please advise. Thanks.

## 2023-05-29 NOTE — Telephone Encounter (Signed)
Patient is scheduled for 06/10/23 @ 2 over at [facility]

## 2023-05-29 NOTE — Telephone Encounter (Signed)
I called patient to give appt for the PFT and then they were asking me for the results of the test

## 2023-06-10 ENCOUNTER — Ambulatory Visit (HOSPITAL_COMMUNITY)
Admission: RE | Admit: 2023-06-10 | Discharge: 2023-06-10 | Disposition: A | Discharge status: Home / Self Care | Payer: MEDICAID | Source: Ambulatory Visit | Attending: Pulmonary Disease | Admitting: Pulmonary Disease

## 2023-06-10 DIAGNOSIS — R911 Solitary pulmonary nodule: Secondary | ICD-10-CM | POA: Insufficient documentation

## 2023-06-10 LAB — PULMONARY FUNCTION TEST
DL/VA % pred: 77 %
DL/VA: 3.21 ml/min/mmHg/L
DLCO unc % pred: 62 %
DLCO unc: 18.01 ml/min/mmHg
FEF 25-75 Post: 1.59 L/sec
FEF 25-75 Pre: 1.14 L/sec
FEF2575-%Change-Post: 38 %
FEF2575-%Pred-Post: 51 %
FEF2575-%Pred-Pre: 37 %
FEV1-%Change-Post: 11 %
FEV1-%Pred-Post: 61 %
FEV1-%Pred-Pre: 54 %
FEV1-Post: 2.32 L
FEV1-Pre: 2.09 L
FEV1FVC-%Change-Post: 4 %
FEV1FVC-%Pred-Pre: 82 %
FEV6-%Change-Post: 7 %
FEV6-%Pred-Post: 73 %
FEV6-%Pred-Pre: 67 %
FEV6-Post: 3.52 L
FEV6-Pre: 3.26 L
FEV6FVC-%Change-Post: 1 %
FEV6FVC-%Pred-Post: 102 %
FEV6FVC-%Pred-Pre: 101 %
FVC-%Change-Post: 6 %
FVC-%Pred-Post: 71 %
FVC-%Pred-Pre: 66 %
FVC-Post: 3.59 L
FVC-Pre: 3.38 L
Post FEV1/FVC ratio: 65 %
Post FEV6/FVC ratio: 98 %
Pre FEV1/FVC ratio: 62 %
Pre FEV6/FVC Ratio: 97 %
RV % pred: 119 %
RV: 2.85 L
TLC % pred: 80 %
TLC: 5.94 L

## 2023-06-10 MED ORDER — ALBUTEROL SULFATE (2.5 MG/3ML) 0.083% IN NEBU
2.5000 mg | INHALATION_SOLUTION | Freq: Once | RESPIRATORY_TRACT | Status: AC
  Administered 2023-06-10: 2.5 mg via RESPIRATORY_TRACT

## 2023-06-15 ENCOUNTER — Encounter: Payer: Self-pay | Admitting: Pulmonary Disease

## 2023-06-15 ENCOUNTER — Ambulatory Visit: Payer: MEDICAID | Admitting: Pulmonary Disease

## 2023-06-15 VITALS — BP 118/72 | HR 100 | Ht 72.0 in | Wt 177.0 lb

## 2023-06-15 DIAGNOSIS — R918 Other nonspecific abnormal finding of lung field: Secondary | ICD-10-CM | POA: Diagnosis not present

## 2023-06-15 DIAGNOSIS — Z720 Tobacco use: Secondary | ICD-10-CM

## 2023-06-15 DIAGNOSIS — R911 Solitary pulmonary nodule: Secondary | ICD-10-CM | POA: Diagnosis not present

## 2023-06-15 MED ORDER — TRELEGY ELLIPTA 100-62.5-25 MCG/ACT IN AEPB: 1.0000 | INHALATION_SPRAY | Freq: Every day | RESPIRATORY_TRACT | Status: DC

## 2023-06-15 NOTE — Addendum Note (Signed)
Addended by: Shelby Dubin on: 06/15/2023 03:44 PM   Modules accepted: Orders

## 2023-06-15 NOTE — Patient Instructions (Addendum)
Thank you for visiting Dr. Tonia Brooms at [facility]. Today we recommend the following:  Orders Placed This Encounter  Procedures   Ambulatory referral to Cardiothoracic Surgery   We will schedule an appt with [facility] [address], [place], [place] [address] [phone]   Trelegy samples   Return if symptoms worsen or fail to improve.    Please do your part to reduce the spread of COVID-19.

## 2023-06-15 NOTE — Progress Notes (Signed)
Synopsis: Referred in Aug 2024 for lung nodule by No ref. provider found  Subjective:   PATIENT ID: [name] GENDER: male DOB: 1961/04/11, MRN: [id number]  Chief Complaint  Patient presents with   Lung Nodule    This is a 62 year old gentleman, past medical history of alcohol use, has prior psychiatric disease.  He is a current every day smoker.Patient was referred after having a lung cancer screening CT.  This was completed on 04/24/2023.  Patient had an 8 x 6 mm lung nodule in the left upper lobe found on a lung cancer screening done at [facility].  Patient was referred for evaluation of pulmonary nodule.  At his max was smoking 2 packs/day.  He has smoked for 30+ years.  OV 06/15/2023: Here today for follow-up after nuclear medicine pet imaging and PFTs.  Patient was found to have stage II COPD on PFTs reviewed this today in the office as well as nuclear medicine PET scan which shows SUV max of 5 in the posterior left upper lobe lesion concerning for primary bronchogenic carcinoma.  There was no evidence of metastatic disease or adenopathy.      Past Medical History:  Diagnosis Date   Alcoholic (HCC)    Bipolar 1 disorder (HCC)    Depression    Diabetes mellitus without complication (HCC)    Homeless    Schizoaffective schizophrenia (HCC)      History reviewed. No pertinent family history.   Past Surgical History:  Procedure Laterality Date   DENTAL SURGERY     wisdom teeth    Social History   Socioeconomic History   Marital status: Single    Spouse name: Not on file   Number of children: Not on file   Years of education: Not on file   Highest education level: Not on file  Occupational History   Not on file  Tobacco Use   Smoking status: Every Day    Current packs/day: 2.00    Types: Cigarettes   Smokeless tobacco: Never   Tobacco comments:    7 packs of cigarettes in a week. 05/13/23 Tay  Vaping Use   Vaping status: Never Used  Substance and  Sexual Activity   Alcohol use: Yes   Drug use: No   Sexual activity: Not on file  Other Topics Concern   Not on file  Social History Narrative   Not on file   Social Determinants of Health   Financial Resource Strain: Not on file  Food Insecurity: Not on file  Transportation Needs: Not on file  Physical Activity: Not on file  Stress: Not on file  Social Connections: Unknown (04/23/2023)   Received from [facility]   Social Network    Social Network: Not on file  Intimate Partner Violence: Unknown (04/23/2023)   Received from [facility]   HITS    Physically Hurt: Not on file    Insult or Talk Down To: Not on file    Threaten Physical Harm: Not on file    Scream or Curse: Not on file     No Known Allergies   Outpatient Medications Prior to Visit  Medication Sig Dispense Refill   cetirizine (ZYRTEC) 10 MG tablet Take 10 mg by mouth daily.     ibuprofen (ADVIL,MOTRIN) 800 MG tablet Take 1 tablet (800 mg total) by mouth every 8 (eight) hours as needed. 21 tablet 0   indomethacin (INDOCIN) 50 MG capsule Take 1 capsule (50 mg total) by mouth  3 (three) times daily with meals. 50 capsule 2   predniSONE (DELTASONE) 20 MG tablet Take 3 tablets (60 mg total) by mouth daily. (Patient not taking: Reported on 12/02/2017) 12 tablet 0   No facility-administered medications prior to visit.    Review of Systems  Constitutional:  Negative for chills, fever, malaise/fatigue and weight loss.  HENT:  Negative for hearing loss, sore throat and tinnitus.   Eyes:  Negative for blurred vision and double vision.  Respiratory:  Positive for shortness of breath. Negative for cough, hemoptysis, sputum production, wheezing and stridor.   Cardiovascular:  Negative for chest pain, palpitations, orthopnea, leg swelling and PND.  Gastrointestinal:  Negative for abdominal pain, constipation, diarrhea, heartburn, nausea and vomiting.  Genitourinary:  Negative for dysuria, hematuria and urgency.   Musculoskeletal:  Negative for joint pain and myalgias.  Skin:  Negative for itching and rash.  Neurological:  Negative for dizziness, tingling, weakness and headaches.  Endo/Heme/Allergies:  Negative for environmental allergies. Does not bruise/bleed easily.  Psychiatric/Behavioral:  Negative for depression. The patient is not nervous/anxious and does not have insomnia.   All other systems reviewed and are negative.    Objective:  Physical Exam Vitals reviewed.  Constitutional:      General: He is not in acute distress.    Appearance: He is well-developed.  HENT:     Head: Normocephalic and atraumatic.  Eyes:     General: No scleral icterus.    Conjunctiva/sclera: Conjunctivae normal.     Pupils: Pupils are equal, round, and reactive to light.  Neck:     Vascular: No JVD.     Trachea: No tracheal deviation.  Cardiovascular:     Rate and Rhythm: Normal rate and regular rhythm.     Heart sounds: Normal heart sounds. No murmur heard. Pulmonary:     Effort: Pulmonary effort is normal. No tachypnea, accessory muscle usage or respiratory distress.     Breath sounds: No stridor. No wheezing, rhonchi or rales.     Comments: Diminished breath sounds bilaterally Abdominal:     General: There is no distension.     Palpations: Abdomen is soft.     Tenderness: There is no abdominal tenderness.  Musculoskeletal:        General: No tenderness.     Cervical back: Neck supple.  Lymphadenopathy:     Cervical: No cervical adenopathy.  Skin:    General: Skin is warm and dry.     Capillary Refill: Capillary refill takes less than 2 seconds.     Findings: No rash.  Neurological:     Mental Status: He is alert and oriented to person, place, and time.  Psychiatric:        Behavior: Behavior normal.      Vitals:   06/15/23 1415  BP: 118/72  Pulse: 100  SpO2: 96%  Weight: 177 lb (80.3 kg)  Height: 6' (1.829 m)   96% on RA BMI Readings from Last 3 Encounters:  06/15/23 24.01  kg/m  05/13/23 23.68 kg/m  03/11/18 19.79 kg/m   Wt Readings from Last 3 Encounters:  06/15/23 177 lb (80.3 kg)  05/13/23 174 lb 9.6 oz (79.2 kg)  03/11/18 150 lb (68 kg)     CBC    Component Value Date/Time   WBC 8.6 03/24/2023 0000   RBC 4.95 03/24/2023 0000   HGB 15.1 03/24/2023 0000   HCT 45.1 03/24/2023 0000   HCT 39.7 05/03/2017 1745   PLT 267 03/24/2023 0000  MCV 91.1 03/24/2023 0000   MCH 30.5 03/24/2023 0000   MCHC 33.5 03/24/2023 0000   RDW 13.7 03/24/2023 0000   LYMPHSABS 1.6 03/11/2018 0903   MONOABS 0.9 03/11/2018 0903   EOSABS 0.1 03/11/2018 0903   BASOSABS 0.0 03/11/2018 1610     Chest Imaging:  Lung cancer screening CT completed at [facility]. Small 8 mm left upper lobe pulmonary nodule. The patient's images have been independently reviewed by me.    Pulmonary Functions Testing Results:    Latest Ref Rng & Units 06/10/2023    1:57 PM  PFT Results  FVC-Pre L 3.38   FVC-Predicted Pre % 66   FVC-Post L 3.59   FVC-Predicted Post % 71   Pre FEV1/FVC % % 62   Post FEV1/FCV % % 65   FEV1-Pre L 2.09   FEV1-Predicted Pre % 54   FEV1-Post L 2.32   DLCO uncorrected ml/min/mmHg 18.01   DLCO UNC% % 62   DLVA Predicted % 77   TLC L 5.94   TLC % Predicted % 80   RV % Predicted % 119     FeNO:   Pathology:   Echocardiogram:   Heart Catheterization:     Assessment & Plan:     ICD-10-CM   1. Lung nodule  R91.1 Ambulatory referral to Cardiothoracic Surgery    2. Tobacco abuse disorder  Z72.0     3. Abnormal CT lung screening  R91.8        Discussion:  This is a 62 year old gentleman past medical history of tobacco abuse and abnormal lung cancer screening CT.  We reviewed his nuclear medicine pet imaging today that shows hypermetabolic uptake within the posterior left upper Lobe lesion with no evidence of adenopathy.  Appears to have a stage I lung cancer.  Also has PFTs with stage II COPD.  Not on any inhalers at this  time.  Plan: Start Trelegy, also given samples of Trelegy today. Referral to cardiothoracic surgery for consideration of lobectomy. Counseled on smoking cessation. Return to clinic after evaluation from thoracic surgery and/or decision is made for resection. I have messaged their scheduler hopefully get him on as early as Wednesday afternoon.   Current Outpatient Medications:    cetirizine (ZYRTEC) 10 MG tablet, Take 10 mg by mouth daily., Disp: , Rfl:    ibuprofen (ADVIL,MOTRIN) 800 MG tablet, Take 1 tablet (800 mg total) by mouth every 8 (eight) hours as needed., Disp: 21 tablet, Rfl: 0   indomethacin (INDOCIN) 50 MG capsule, Take 1 capsule (50 mg total) by mouth 3 (three) times daily with meals., Disp: 50 capsule, Rfl: 2   [name], DO  Pulmonary Critical Care 06/15/2023 2:59 PM

## 2023-06-17 ENCOUNTER — Institutional Professional Consult (permissible substitution) (INDEPENDENT_AMBULATORY_CARE_PROVIDER_SITE_OTHER): Payer: MEDICAID | Admitting: Thoracic Surgery (Cardiothoracic Vascular Surgery)

## 2023-06-17 ENCOUNTER — Encounter: Payer: Self-pay | Admitting: Thoracic Surgery (Cardiothoracic Vascular Surgery)

## 2023-06-17 VITALS — BP 146/80 | HR 91 | Resp 20 | Ht 72.0 in | Wt 177.0 lb

## 2023-06-17 DIAGNOSIS — R911 Solitary pulmonary nodule: Secondary | ICD-10-CM | POA: Diagnosis not present

## 2023-06-17 NOTE — Progress Notes (Signed)
PCP is [facility] Referring Provider is [name], DO  Chief Complaint  Patient presents with   Lung Lesion    PET 8/22, PFTS 9/18    HPI: [name] is sent for consultation regarding a left upper lobe lung nodule.  [name] is a 62 year old man with a history of tobacco abuse, alcoholism, bipolar 1 disorder, schizoaffective schizophrenia, depression, type 2 diabetes without complication, gout and hyperlipidemia.  [name] recently saw a PA at [facility] for a refill of his Indocin which [name] takes for gout.  Because of his smoking history it was recommended that [name] have a low-dose CT for lung cancer screening.  That showed an 8 mm left upper lobe lung nodule.  [name] was referred to Dr. Tonia Brooms.  PET/CT showed the nodule was hypermetabolic with an SUV of 5.  [name] now sent for consideration for surgical resection.  [name] continues to smoke about a pack a day.  Overall has about an 80-pack-year history (2 PPD x 40 years).  Currently lives with his significant other.  No chest pain, pressure, tightness, or shortness of breath.  Can walk up a flight of stairs.  Would be tired with 2 flights.  No change in appetite or weight loss.  Zubrod Score: At the time of surgery this patient's most appropriate activity status/level should be described as: []     0    Normal activity, no symptoms [x]     1    Restricted in physical strenuous activity but ambulatory, able to do out light work []     2    Ambulatory and capable of self care, unable to do work activities, up and about >50 % of waking hours                              []     3    Only limited self care, in bed greater than 50% of waking hours []     4    Completely disabled, no self care, confined to bed or chair []     5    Moribund  Past Medical History:  Diagnosis Date   Alcoholic (HCC)    Bipolar 1 disorder (HCC)    Depression    Diabetes mellitus without complication (HCC)    Homeless    Schizoaffective schizophrenia  (HCC)     Past Surgical History:  Procedure Laterality Date   DENTAL SURGERY     wisdom teeth    History reviewed. No pertinent family history.  Social History Social History   Tobacco Use   Smoking status: Every Day    Current packs/day: 2.00    Types: Cigarettes   Smokeless tobacco: Never   Tobacco comments:    7 packs of cigarettes in a week. 05/13/23 Tay  Vaping Use   Vaping status: Never Used  Substance Use Topics   Alcohol use: Yes   Drug use: No    Current Outpatient Medications  Medication Sig Dispense Refill   cetirizine (ZYRTEC) 10 MG tablet Take 10 mg by mouth daily.     Fluticasone-Umeclidin-Vilant (TRELEGY ELLIPTA) 100-62.5-25 MCG/ACT AEPB Inhale 1 puff into the lungs daily.     ibuprofen (ADVIL,MOTRIN) 800 MG tablet Take 1 tablet (800 mg total) by mouth every 8 (eight) hours as needed. 21 tablet 0   indomethacin (INDOCIN) 50 MG capsule Take 1 capsule (50 mg total) by mouth 3 (three) times daily with meals. 50  capsule 2   No current facility-administered medications for this visit.    No Known Allergies  Review of Systems  Constitutional:  Negative for activity change, fatigue and unexpected weight change.  HENT:  Negative for trouble swallowing and voice change.   Eyes:  Negative for visual disturbance.  Respiratory:  Positive for cough. Negative for shortness of breath and wheezing.   Cardiovascular:  Negative for chest pain and leg swelling.  Gastrointestinal:  Negative for abdominal pain.  Genitourinary:  Negative for difficulty urinating and dysuria.  Musculoskeletal:  Positive for arthralgias. Negative for gait problem.  Neurological:  Negative for seizures and weakness.  Hematological:  Negative for adenopathy. Does not bruise/bleed easily.  Psychiatric/Behavioral:  Positive for dysphoric mood. The patient is nervous/anxious.   All other systems reviewed and are negative.   BP (!) 146/80 (BP Location: Right Arm, Patient Position: Sitting)    Pulse 91   Resp 20   Ht 6' (1.829 m)   Wt 177 lb (80.3 kg)   SpO2 96% Comment: RA  BMI 24.01 kg/m  Physical Exam Vitals reviewed.  Constitutional:      General: [name] is not in acute distress.    Appearance: Normal appearance.  HENT:     Head: Normocephalic and atraumatic.  Eyes:     General: No scleral icterus.    Extraocular Movements: Extraocular movements intact.  Cardiovascular:     Rate and Rhythm: Normal rate and regular rhythm.     Heart sounds: Normal heart sounds. No murmur heard.    No friction rub. No gallop.  Pulmonary:     Effort: No respiratory distress.     Breath sounds: Normal breath sounds. No wheezing or rales.  Abdominal:     General: There is no distension.     Palpations: Abdomen is soft.  Skin:    General: Skin is warm and dry.  Neurological:     General: No focal deficit present.     Mental Status: [name] is alert and oriented to person, place, and time.     Cranial Nerves: No cranial nerve deficit.     Motor: No weakness.    Diagnostic Tests: NUCLEAR MEDICINE PET SKULL BASE TO THIGH   TECHNIQUE: 8.7 mCi F-18 FDG was injected intravenously. Full-ring PET imaging was performed from the skull base to thigh after the radiotracer. CT data was obtained and used for attenuation correction and anatomic localization.   Fasting blood glucose: 94 mg/dl   COMPARISON:  CT chest dated 10/16/2017   FINDINGS: Mediastinal blood pool activity: SUV max 2.6   Liver activity: SUV max NA   NECK: No hypermetabolic cervical lymphadenopathy.   Incidental CT findings: None.   CHEST: 13 x 6 mm solid nodule in the medial left upper lobe (series 7/image 16), max SUV 5.1, suspicious for primary bronchogenic carcinoma.   No hypermetabolic thoracic lymphadenopathy.   Incidental CT findings: Atherosclerotic calcifications of the aortic arch. Mild coronary atherosclerosis of the LAD and left circumflex. Mild paraseptal emphysematous changes, upper lung  predominant.   ABDOMEN/PELVIS: No abnormal hypermetabolism in the liver, spleen, pancreas, or adrenal glands.   Mild focal hypermetabolism in the medial cecum/ascending colon, max SUV 4.0. Associated 2.0 x 2.9 cm low-density lesion with macroscopic fat near the ileocecal valve (series 4/image 152), favoring a benign lipoma. However, follow-up colonoscopy is suggested.   No hypermetabolic abdominopelvic lymphadenopathy.   Incidental CT findings: Atherosclerotic calcifications of the abdominal aorta and branch vessels. Focal outpouching along the right posterolateral infrarenal  abdominal aorta, with resulting 3.6 x 2.6 cm saccular aneurysm (series 4/image 144). Left colonic diverticulosis, without evidence of diverticulitis. Postsurgical changes related to prior right inguinal hernia repair. Tiny fat containing left femoral hernia.   SKELETON: No focal hypermetabolic activity to suggest skeletal metastasis.   Incidental CT findings: Mild degenerative changes of the visualized thoracolumbar spine.   IMPRESSION: 13 mm solid nodule in the medial left upper lobe, suspicious for primary bronchogenic carcinoma.   No evidence of metastatic disease.   3.6 cm infrarenal saccular abdominal aortic aneurysm.   Recommend vascular consultation.   Reference: J Vasc Surg 1610;96:0-45.   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 Follow-up colonoscopy is suggested for confirmation.     Electronically Signed   By: Charline Bills M.D.   On: 05/14/2023 22:59 I personally reviewed the PET/CT images.  9 x 6 mm (by my measurements) nodule in medial aspect of left upper lobe abutting mediastinum.  Markedly hypermetabolic.  Hypermetabolism in ileocecal region also noted.  Pulmonary function testing 06/10/2023 FVC 3.38 (66%) FEV1 2.09 (54%) FEV1 2.32 (61%) postbronchodilator DLCO 18.01 (62%)  Impression: [name] is a 62 year old man with a history of tobacco abuse,  alcoholism, bipolar 1 disorder, schizoaffective schizophrenia, depression, type 2 diabetes without complication, gout and hyperlipidemia.    Left upper lobe lung nodule-9 x 6 mm.  Significant hypermetabolic activity for a nodule that size.  Findings are consistent with a new primary bronchogenic carcinoma.  Infectious and inflammatory nodules are also in the differential diagnosis but are far less likely.  This has to be considered a lung cancer and less can be proven otherwise.  Nodule is in an area that is not favorable for biopsy.  I think the best option would be to proceed for surgical resection for definitive diagnosis and treatment at the same setting.  I described the postoperative procedure to [name] and his significant other.  The plan would be to do a robotic assisted left upper lobe wedge resection or segmentectomy, or less likely lobectomy depending on intraoperative findings.  I informed them of the need for general anesthesia, the incisions to be used, the use of the surgical robot, the use of a drainage tube postoperatively, the expected hospital stay, and the overall recovery.  I informed them of the indications, risks, benefits, and alternatives.  They understand the risks include, but are not limited to death, MI, DVT, PE, bleeding, possible need for transfusion, infection, prolonged air leak, cardiac arrhythmias, pain issues, as well as possibility of other unforeseeable complications.  [name] accepts the risks and agrees to proceed.  Tobacco abuse-emphasized the importance of tobacco cessation for both short and long-term health.  Infrarenal AAA-3.6 cm.  No indication for intervention but will need referral to vascular surgery.  Hypermetabolic area in ileocecum-will need colonoscopy.  [name] will contact [facility] to arrange that.  Plan: Robotic assisted left upper lobe wedge resection versus segmentectomy, possible lobectomy on 07/23/2023.  [name], MD [facility] [phone]

## 2023-06-18 ENCOUNTER — Other Ambulatory Visit: Payer: Self-pay | Admitting: *Deleted

## 2023-06-18 ENCOUNTER — Encounter: Payer: Self-pay | Admitting: *Deleted

## 2023-06-18 DIAGNOSIS — R911 Solitary pulmonary nodule: Secondary | ICD-10-CM

## 2023-07-07 ENCOUNTER — Telehealth: Payer: Self-pay

## 2023-07-07 ENCOUNTER — Ambulatory Visit (AMBULATORY_SURGERY_CENTER): Payer: MEDICAID

## 2023-07-07 VITALS — Ht 72.0 in | Wt 177.0 lb

## 2023-07-07 DIAGNOSIS — Z1211 Encounter for screening for malignant neoplasm of colon: Secondary | ICD-10-CM

## 2023-07-07 MED ORDER — PEG 3350-KCL-NA BICARB-NACL 420 G PO SOLR
4000.0000 mL | Freq: Once | ORAL | 0 refills | Status: AC
Start: 2023-07-07 — End: 2023-07-07

## 2023-07-07 NOTE — Telephone Encounter (Signed)
I do not believe that I have ever seen him. Reviewed. Thanks

## 2023-07-07 NOTE — Progress Notes (Signed)
No egg or soy allergy known to patient  No issues known to pt with past sedation with any surgeries or procedures Patient denies ever being told they had issues or difficulty with intubation  No FH of Malignant Hyperthermia Pt is not on diet pills Pt is not on  home 02  Pt is not on blood thinners  Pt denies issues with constipation  No A fib or A flutter Have any cardiac testing pending--no  LOA: independent  Prep: Golytely    Patient's chart reviewed by Cathlyn Parsons CNRA prior to previsit and patient appropriate for the [facility].  Previsit completed and red dot placed by patient's name on their procedure day (on provider's schedule).     PV competed with patient. Prep instructions sent via mychart and home address.

## 2023-07-07 NOTE — Telephone Encounter (Signed)
Dr. Marina Goodell,   Sending as an FYI for the patient. PET scan in 8/22 with results attached. He wanted you to be aware beforehand.   Mild focal hypermetabolism in the medial cecum/ascending colon, max SUV 4.0. Associated 2.0 x 2.9 cm low-density lesion with macroscopic fat near the ileocecal valve (series 4/image 152), favoring a benign lipoma. However, follow-up colonoscopy is suggested.

## 2023-07-16 ENCOUNTER — Ambulatory Visit: Payer: MEDICAID | Admitting: Internal Medicine

## 2023-07-16 ENCOUNTER — Encounter: Payer: Self-pay | Admitting: Internal Medicine

## 2023-07-16 VITALS — BP 77/56 | HR 84 | Temp 97.6°F | Resp 11 | Ht 72.0 in | Wt 177.0 lb

## 2023-07-16 DIAGNOSIS — D125 Benign neoplasm of sigmoid colon: Secondary | ICD-10-CM | POA: Diagnosis not present

## 2023-07-16 DIAGNOSIS — K635 Polyp of colon: Secondary | ICD-10-CM | POA: Diagnosis not present

## 2023-07-16 DIAGNOSIS — Z1211 Encounter for screening for malignant neoplasm of colon: Secondary | ICD-10-CM | POA: Diagnosis not present

## 2023-07-16 HISTORY — PX: COLONOSCOPY: SHX174

## 2023-07-16 MED ORDER — SODIUM CHLORIDE 0.9 % IV SOLN: 500.0000 mL | INTRAVENOUS | Status: DC

## 2023-07-16 NOTE — Progress Notes (Signed)
Called to room to assist during endoscopic procedure.  Patient ID and intended procedure confirmed with present staff. Received instructions for my participation in the procedure from the performing physician.  

## 2023-07-16 NOTE — Progress Notes (Signed)
HISTORY OF PRESENT ILLNESS:  [name] is a 62 y.o. male who presents today for screening colonoscopy.  No complaints  REVIEW OF SYSTEMS:  All non-GI ROS negative except for  Past Medical History:  Diagnosis Date   Alcoholic (HCC)    Bipolar 1 disorder (HCC)    Depression    Diabetes mellitus without complication (HCC)    Homeless    Schizoaffective schizophrenia (HCC)     Past Surgical History:  Procedure Laterality Date   DENTAL SURGERY     wisdom teeth    Social History [name]  reports that he has been smoking cigarettes. He has never used smokeless tobacco. He reports current alcohol use. He reports that he does not use drugs.  family history is not on file.  No Known Allergies     PHYSICAL EXAMINATION: Vital signs: BP (!) 86/55   Pulse 96   Temp 97.6 F (36.4 C)   Resp (!) 8   Ht 6' (1.829 m)   Wt 177 lb (80.3 kg)   SpO2 100%   BMI 24.01 kg/m  General: Well-developed, well-nourished, no acute distress HEENT: Sclerae are anicteric, conjunctiva pink. Oral mucosa intact Lungs: Clear Heart: Regular Abdomen: soft, nontender, nondistended, no obvious ascites, no peritoneal signs, normal bowel sounds. No organomegaly. Extremities: No edema Psychiatric: alert and oriented x3. Cooperative     ASSESSMENT:  Colon cancer screening   PLAN:   Screening colonoscopy

## 2023-07-16 NOTE — Progress Notes (Signed)
Pt's states no medical or surgical changes since previsit or office visit. 

## 2023-07-16 NOTE — Progress Notes (Signed)
Sedate, gd SR, tolerated procedure well, VSS, report to RN 

## 2023-07-16 NOTE — Patient Instructions (Signed)
-   Patient given educational handouts related to procedure. - Resume previous diet - Continue present medications. - Await pathology results    YOU HAD AN ENDOSCOPIC PROCEDURE TODAY AT THE [facility]:   Refer to the procedure report that was given to you for any specific questions about what was found during the examination.  If the procedure report does not answer your questions, please call your gastroenterologist to clarify.  If you requested that your care partner not be given the details of your procedure findings, then the procedure report has been included in a sealed envelope for you to review at your convenience later.  YOU SHOULD EXPECT: Some feelings of bloating in the abdomen. Passage of more gas than usual.  Walking can help get rid of the air that was put into your GI tract during the procedure and reduce the bloating. If you had a lower endoscopy (such as a colonoscopy or flexible sigmoidoscopy) you may notice spotting of blood in your stool or on the toilet paper. If you underwent a bowel prep for your procedure, you may not have a normal bowel movement for a few days.  Please Note:  You might notice some irritation and congestion in your nose or some drainage.  This is from the oxygen used during your procedure.  There is no need for concern and it should clear up in a day or so.  SYMPTOMS TO REPORT IMMEDIATELY:  Following lower endoscopy (colonoscopy or flexible sigmoidoscopy):  Excessive amounts of blood in the stool  Significant tenderness or worsening of abdominal pains  Swelling of the abdomen that is new, acute  Fever of 100F or higher    For urgent or emergent issues, a gastroenterologist can be reached at any hour by calling [phone]. Do not use MyChart messaging for urgent concerns.    DIET:  We do recommend a small meal at first, but then you may proceed to your regular diet.  Drink plenty of fluids but you should avoid alcoholic beverages for  24 hours.  ACTIVITY:  You should plan to take it easy for the rest of today and you should NOT DRIVE or use heavy machinery until tomorrow (because of the sedation medicines used during the test).    FOLLOW UP: Our staff will call the number listed on your records the next business day following your procedure.  We will call around 7:15- 8:00 am to check on you and address any questions or concerns that you may have regarding the information given to you following your procedure. If we do not reach you, we will leave a message.     If any biopsies were taken you will be contacted by phone or by letter within the next 1-3 weeks.  Please call us at [phone] if you have not heard about the biopsies in 3 weeks.    SIGNATURES/CONFIDENTIALITY: You and/or your care partner have signed paperwork which will be entered into your electronic medical record.  These signatures attest to the fact that that the information above on your After Visit Summary has been reviewed and is understood.  Full responsibility of the confidentiality of this discharge information lies with you and/or your care-partner.

## 2023-07-16 NOTE — Op Note (Signed)
[facility] Patient Name: [name] Procedure Date: 07/16/2023 8:32 AM MRN: [id number] Endoscopist: Wilhemina Bonito. Marina Goodell , MD, [id number] Age: 62 Referring MD:  Date of Birth: 1961/05/26 Gender: Male Account #: [id number] Procedure:                Colonoscopy with cold snare polypectomy x 1 Indications:              Screening for colorectal malignant neoplasm Medicines:                Monitored Anesthesia Care Procedure:                Pre-Anesthesia Assessment:                           - Prior to the procedure, a History and Physical                            was performed, and patient medications and                            allergies were reviewed. The patient's tolerance of                            previous anesthesia was also reviewed. The risks                            and benefits of the procedure and the sedation                            options and risks were discussed with the patient.                            All questions were answered, and informed consent                            was obtained. Prior Anticoagulants: The patient has                            taken no anticoagulant or antiplatelet agents. ASA                            Grade Assessment: I - A normal, healthy patient.                            After reviewing the risks and benefits, the patient                            was deemed in satisfactory condition to undergo the                            procedure.                           After obtaining informed consent, the colonoscope  was passed under direct vision. Throughout the                            procedure, the patient's blood pressure, pulse, and                            oxygen saturations were monitored continuously. The                            CF [id number] #[id number] was introduced through the anus                            and advanced to the the cecum, identified by                             appendiceal orifice and ileocecal valve. The                            ileocecal valve, appendiceal orifice, and rectum                            were photographed. The quality of the bowel                            preparation was excellent. The colonoscopy was                            performed without difficulty. The patient tolerated                            the procedure well. The bowel preparation used was                            SUPREP via split dose instruction. Scope In: 8:43:30 AM Scope Out: 8:54:04 AM Scope Withdrawal Time: 0 hours 7 minutes 59 seconds  Total Procedure Duration: 0 hours 10 minutes 34 seconds  Findings:                 A 4 mm polyp was found in the sigmoid colon. The                            polyp was removed with a cold snare. Resection and                            retrieval were complete.                           Many diverticula were found in the entire colon.                           The exam was otherwise without abnormality on                            direct and retroflexion views. Complications:  No immediate complications. Estimated blood loss:                            None. Estimated Blood Loss:     Estimated blood loss: none. Impression:               - Diverticulosis in the entire examined colon.                           ?" 4 mm sigmoid colon polyp removed with cold snare                            and submitted for pathologic analysis.                           - The examination was otherwise normal on direct                            and retroflexion views. Recommendation:           - Repeat colonoscopy in 7-10 years for surveillance.                           - Patient has a contact number available for                            emergencies. The signs and symptoms of potential                            delayed complications were discussed with the                            patient. Return to normal  activities tomorrow.                            Written discharge instructions were provided to the                            patient.                           - Resume previous diet.                           - Continue present medications.                           - Await pathology results. Wilhemina Bonito. Marina Goodell, MD 07/16/2023 9:15:49 AM This report has been signed electronically.

## 2023-07-17 ENCOUNTER — Telehealth: Payer: Self-pay

## 2023-07-17 NOTE — Telephone Encounter (Signed)
  Follow up Call-     07/16/2023    7:13 AM  Call back number  Post procedure Call Back phone  # [phone]  Permission to leave phone message Yes     Patient questions:  Do you have a fever, pain , or abdominal swelling? No. Pain Score  0 *  Have you tolerated food without any problems? Yes.    Have you been able to return to your normal activities? Yes.    Do you have any questions about your discharge instructions: Diet   No. Medications  No. Follow up visit  No.  Do you have questions or concerns about your Care? No.  Actions: * If pain score is 4 or above: No action needed, pain <4.

## 2023-07-20 ENCOUNTER — Encounter: Payer: Self-pay | Admitting: Internal Medicine

## 2023-07-20 LAB — SURGICAL PATHOLOGY

## 2023-07-20 NOTE — Pre-Procedure Instructions (Signed)
Surgical Instructions   Your procedure is scheduled on July 23, 2023. Report to [facility] Main Entrance "A" at 5:30 A.M., then check in with the Admitting office. Any questions or running late day of surgery: call [phone]  Questions prior to your surgery date: call [phone], Monday-Friday, 8am-4pm. If you experience any cold or flu symptoms such as cough, fever, chills, shortness of breath, etc. between now and your scheduled surgery, please notify us at the above number.     Remember:  Do not eat or drink after midnight the night before your surgery. No gum, mints or hard candy.       Take these medicines the morning of surgery with A SIP OF WATER: None   May take these medicines IF NEEDED: acetaminophen (TYLENOL)  cetirizine (ZYRTEC)    One week prior to surgery, STOP taking any Aspirin (unless otherwise instructed by your surgeon) Aleve, Naproxen, Ibuprofen, Motrin, Advil, Goody's, BC's, all herbal medications, fish oil, and non-prescription vitamins, and indomethacin (INDOCIN).           WHAT DO I DO ABOUT MY DIABETES MEDICATION?   Do not take oral diabetes medicines (pills) the morning of surgery.  THE NIGHT BEFORE SURGERY, take ___________ units of ___________insulin.       THE MORNING OF SURGERY, take _____________ units of __________insulin.  The day of surgery, do not take other diabetes injectables, including Byetta (exenatide), Bydureon (exenatide ER), Victoza (liraglutide), or Trulicity (dulaglutide).  If your CBG is greater than 220 mg/dL, you may take  of your sliding scale (correction) dose of insulin.   HOW TO MANAGE YOUR DIABETES BEFORE AND AFTER SURGERY  Why is it important to control my blood sugar before and after surgery? Improving blood sugar levels before and after surgery helps healing and can limit problems. A way of improving blood sugar control is eating a healthy diet by:  Eating less sugar and carbohydrates  Increasing  activity/exercise  Talking with your doctor about reaching your blood sugar goals High blood sugars (greater than 180 mg/dL) can raise your risk of infections and slow your recovery, so you will need to focus on controlling your diabetes during the weeks before surgery. Make sure that the doctor who takes care of your diabetes knows about your planned surgery including the date and location.  How do I manage my blood sugar before surgery? Check your blood sugar at least 4 times a day, starting 2 days before surgery, to make sure that the level is not too high or low.  Check your blood sugar the morning of your surgery when you wake up and every 2 hours until you get to the Short Stay unit.  If your blood sugar is less than 70 mg/dL, you will need to treat for low blood sugar: Do not take insulin. Treat a low blood sugar (less than 70 mg/dL) with  cup of clear juice (cranberry or apple), 4 glucose tablets, OR glucose gel. Recheck blood sugar in 15 minutes after treatment (to make sure it is greater than 70 mg/dL). If your blood sugar is not greater than 70 mg/dL on recheck, call [phone] for further instructions. Report your blood sugar to the short stay nurse when you get to Short Stay.  If you are admitted to the hospital after surgery: Your blood sugar will be checked by the staff and you will probably be given insulin after surgery (instead of oral diabetes medicines) to make sure you have good blood sugar levels. The goal  for blood sugar control after surgery is 80-180 mg/dL.            Do NOT Smoke (Tobacco/Vaping) for 24 hours prior to your procedure.  If you use a CPAP at night, you may bring your mask/headgear for your overnight stay.   You will be asked to remove any contacts, glasses, piercing's, hearing aid's, dentures/partials prior to surgery. Please bring cases for these items if needed.    Patients discharged the day of surgery will not be allowed to drive home, and  someone needs to stay with them for 24 hours.  SURGICAL WAITING ROOM VISITATION Patients may have no more than 2 support people in the waiting area - these visitors may rotate.   Pre-op nurse will coordinate an appropriate time for 1 ADULT support person, who may not rotate, to accompany patient in pre-op.  Children under the age of 73 must have an adult with them who is not the patient and must remain in the main waiting area with an adult.  If the patient needs to stay at the hospital during part of their recovery, the visitor guidelines for inpatient rooms apply.  Please refer to the [facility] website for the visitor guidelines for any additional information.   If you received a COVID test during your pre-op visit  it is requested that you wear a mask when out in public, stay away from anyone that may not be feeling well and notify your surgeon if you develop symptoms. If you have been in contact with anyone that has tested positive in the last 10 days please notify you surgeon.      Pre-operative CHG Bathing Instructions   You can play a key role in reducing the risk of infection after surgery. Your skin needs to be as free of germs as possible. You can reduce the number of germs on your skin by washing with CHG (chlorhexidine gluconate) soap before surgery. CHG is an antiseptic soap that kills germs and continues to kill germs even after washing.   DO NOT use if you have an allergy to chlorhexidine/CHG or antibacterial soaps. If your skin becomes reddened or irritated, stop using the CHG and notify one of our RNs at [phone].              TAKE A SHOWER THE NIGHT BEFORE SURGERY AND THE DAY OF SURGERY    Please keep in mind the following:  DO NOT shave, including legs and underarms, 48 hours prior to surgery.   You may shave your face before/day of surgery.  Place clean sheets on your bed the night before surgery Use a clean washcloth (not used since being washed) for each  shower. DO NOT sleep with pet's night before surgery.  CHG Shower Instructions:  Wash your face and private area with normal soap. If you choose to wash your hair, wash first with your normal shampoo.  After you use shampoo/soap, rinse your hair and body thoroughly to remove shampoo/soap residue.  Turn the water OFF and apply half the bottle of CHG soap to a CLEAN washcloth.  Apply CHG soap ONLY FROM YOUR NECK DOWN TO YOUR TOES (washing for 3-5 minutes)  DO NOT use CHG soap on face, private areas, open wounds, or sores.  Pay special attention to the area where your surgery is being performed.  If you are having back surgery, having someone wash your back for you may be helpful. Wait 2 minutes after CHG soap is applied, then  you may rinse off the CHG soap.  Pat dry with a clean towel  Put on clean pajamas    Additional instructions for the day of surgery: DO NOT APPLY any lotions, deodorants, cologne, or perfumes.   Do not wear jewelry or makeup Do not wear nail polish, gel polish, artificial nails, or any other type of covering on natural nails (fingers and toes) Do not bring valuables to the hospital. [facility] is not responsible for valuables/personal belongings. Put on clean/comfortable clothes.  Please brush your teeth.  Ask your nurse before applying any prescription medications to the skin.

## 2023-07-21 ENCOUNTER — Encounter (HOSPITAL_COMMUNITY): Payer: Self-pay

## 2023-07-21 ENCOUNTER — Ambulatory Visit (HOSPITAL_COMMUNITY)
Admission: RE | Admit: 2023-07-21 | Discharge: 2023-07-21 | Disposition: A | Discharge status: Home / Self Care | Payer: MEDICAID | Source: Ambulatory Visit | Attending: Thoracic Surgery (Cardiothoracic Vascular Surgery) | Admitting: Thoracic Surgery (Cardiothoracic Vascular Surgery)

## 2023-07-21 ENCOUNTER — Other Ambulatory Visit: Payer: Self-pay

## 2023-07-21 ENCOUNTER — Encounter (HOSPITAL_COMMUNITY)
Admission: RE | Admit: 2023-07-21 | Discharge: 2023-07-21 | Disposition: A | Discharge status: Home / Self Care | Payer: MEDICAID | Source: Ambulatory Visit | Attending: Thoracic Surgery (Cardiothoracic Vascular Surgery) | Admitting: Thoracic Surgery (Cardiothoracic Vascular Surgery)

## 2023-07-21 VITALS — BP 107/79 | HR 114 | Temp 98.1°F | Resp 16

## 2023-07-21 DIAGNOSIS — R911 Solitary pulmonary nodule: Secondary | ICD-10-CM | POA: Insufficient documentation

## 2023-07-21 DIAGNOSIS — Z1152 Encounter for screening for COVID-19: Secondary | ICD-10-CM | POA: Diagnosis not present

## 2023-07-21 DIAGNOSIS — Z01818 Encounter for other preprocedural examination: Secondary | ICD-10-CM | POA: Insufficient documentation

## 2023-07-21 DIAGNOSIS — E119 Type 2 diabetes mellitus without complications: Secondary | ICD-10-CM

## 2023-07-21 LAB — CBC
HCT: 44.9 % (ref 39.0–52.0)
Hemoglobin: 14.8 g/dL (ref 13.0–17.0)
MCH: 30.8 pg (ref 26.0–34.0)
MCHC: 33 g/dL (ref 30.0–36.0)
MCV: 93.3 fL (ref 80.0–100.0)
Platelets: 225 10*3/uL (ref 150–400)
RBC: 4.81 MIL/uL (ref 4.22–5.81)
RDW: 13.6 % (ref 11.5–15.5)
WBC: 11.2 10*3/uL — ABNORMAL HIGH (ref 4.0–10.5)
nRBC: 0 % (ref 0.0–0.2)

## 2023-07-21 LAB — APTT: aPTT: 28 s (ref 24–36)

## 2023-07-21 LAB — GLUCOSE, CAPILLARY: Glucose-Capillary: 98 mg/dL (ref 70–99)

## 2023-07-21 LAB — PROTIME-INR
INR: 1 (ref 0.8–1.2)
Prothrombin Time: 13.9 s (ref 11.4–15.2)

## 2023-07-21 LAB — SURGICAL PCR SCREEN
MRSA, PCR: NEGATIVE
Staphylococcus aureus: NEGATIVE

## 2023-07-21 LAB — HEMOGLOBIN A1C
Hgb A1c MFr Bld: 5.6 % (ref 4.8–5.6)
Mean Plasma Glucose: 114.02 mg/dL

## 2023-07-21 LAB — URINALYSIS, ROUTINE W REFLEX MICROSCOPIC
Bilirubin Urine: NEGATIVE
Glucose, UA: NEGATIVE mg/dL
Hgb urine dipstick: NEGATIVE
Ketones, ur: NEGATIVE mg/dL
Leukocytes,Ua: NEGATIVE
Nitrite: NEGATIVE
Protein, ur: NEGATIVE mg/dL
Specific Gravity, Urine: 1.009 (ref 1.005–1.030)
pH: 6 (ref 5.0–8.0)

## 2023-07-21 LAB — COMPREHENSIVE METABOLIC PANEL
ALT: 14 U/L (ref 0–44)
AST: 19 U/L (ref 15–41)
Albumin: 3.8 g/dL (ref 3.5–5.0)
Alkaline Phosphatase: 74 U/L (ref 38–126)
Anion gap: 10 (ref 5–15)
BUN: 11 mg/dL (ref 8–23)
CO2: 26 mmol/L (ref 22–32)
Calcium: 9.3 mg/dL (ref 8.9–10.3)
Chloride: 102 mmol/L (ref 98–111)
Creatinine, Ser: 1.24 mg/dL (ref 0.61–1.24)
GFR, Estimated: >60 mL/min (ref >60)
Glucose, Bld: 97 mg/dL (ref 70–99)
Potassium: 3.9 mmol/L (ref 3.5–5.1)
Sodium: 138 mmol/L (ref 135–145)
Total Bilirubin: 0.6 mg/dL (ref 0.3–1.2)
Total Protein: 6.9 g/dL (ref 6.5–8.1)

## 2023-07-21 LAB — SARS CORONAVIRUS 2 BY RT PCR: SARS Coronavirus 2 by RT PCR: NEGATIVE

## 2023-07-21 NOTE — Progress Notes (Signed)
PCP - [facility] Cardiologist - denies Pulmonologist- Icard, Elige Radon, DO  PPM/ICD - denies Device Orders - n/a Rep Notified - n/a  Chest x-ray - 07/21/2023 EKG - 07/21/2023 Stress Test - denies ECHO - denies Cardiac Cath - denies  Sleep Study - denies CPAP - n/a  Fasting Blood Sugar - pt denies DM; states he has prediabetes; 98 at PAT Checks Blood Sugar 0 times a day  Last dose of GLP1 agonist-  n/a GLP1 instructions: n/a  Blood Thinner Instructions: n/a Aspirin Instructions: n/a  ERAS Protcol - NPO PRE-SURGERY Ensure or G2- n/a  COVID TEST- 07/21/2023  Anesthesia review: yes   Patient denies shortness of breath, fever, cough and chest pain at PAT appointment   All instructions explained to the patient, with a verbal understanding of the material. Patient agrees to go over the instructions while at home for a better understanding. Patient also instructed to self quarantine after being tested for COVID-19. The opportunity to ask questions was provided.

## 2023-07-22 NOTE — Anesthesia Preprocedure Evaluation (Addendum)
Anesthesia Evaluation  Patient identified by MRN, date of birth, ID band Patient awake    Reviewed: Allergy & Precautions, NPO status , Patient's Chart, lab work & pertinent test results  Airway Mallampati: II  TM Distance: >3 FB Neck ROM: Full    Dental  (+) Edentulous Upper, Poor Dentition   Pulmonary Current Smoker and Patient abstained from smoking.    + decreased breath sounds      Cardiovascular hypertension,  Rhythm:Regular Rate:Normal     Neuro/Psych    Depression Bipolar Disorder Schizophrenia  negative neurological ROS     GI/Hepatic negative GI ROS, Neg liver ROS,,,  Endo/Other  diabetes    Renal/GU negative Renal ROS  negative genitourinary   Musculoskeletal negative musculoskeletal ROS (+)    Abdominal Normal abdominal exam  (+)   Peds  Hematology Lab Results      Component                Value               Date                      WBC                      11.2 (H)            07/21/2023                HGB                      14.8                07/21/2023                HCT                      44.9                07/21/2023                MCV                      93.3                07/21/2023                PLT                      225                 07/21/2023             Lab Results      Component                Value               Date                      NA                       138                 07/21/2023                K  3.9                 07/21/2023                CO2                      26                  07/21/2023                GLUCOSE                  97                  07/21/2023                BUN                      11                  07/21/2023                CREATININE               1.24                07/21/2023                CALCIUM                  9.3                 07/21/2023                EGFR                     77                   03/24/2023                GFRNONAA                 >60                 07/21/2023              Anesthesia Other Findings   Reproductive/Obstetrics                             Anesthesia Physical Anesthesia Plan  ASA: 3  Anesthesia Plan: General   Post-op Pain Management: Tylenol PO (pre-op)*   Induction: Intravenous  PONV Risk Score and Plan: Ondansetron, Dexamethasone, Midazolam and Treatment may vary due to age or medical condition  Airway Management Planned: Mask and Double Lumen EBT  Additional Equipment: Arterial line  Intra-op Plan:   Post-operative Plan: Extubation in OR  Informed Consent: I have reviewed the patients History and Physical, chart, labs and discussed the procedure including the risks, benefits and alternatives for the proposed anesthesia with the patient or authorized representative who has indicated his/her understanding and acceptance.     Dental advisory given  Plan Discussed with: CRNA  Anesthesia Plan Comments:        Anesthesia Quick Evaluation

## 2023-07-23 ENCOUNTER — Encounter (HOSPITAL_COMMUNITY)
Admission: RE | Disposition: A | Discharge status: Home / Self Care | Payer: Self-pay | Source: Home / Self Care | Attending: Thoracic Surgery (Cardiothoracic Vascular Surgery)

## 2023-07-23 ENCOUNTER — Other Ambulatory Visit: Payer: Self-pay

## 2023-07-23 ENCOUNTER — Inpatient Hospital Stay (HOSPITAL_COMMUNITY): Payer: MEDICAID

## 2023-07-23 ENCOUNTER — Inpatient Hospital Stay (HOSPITAL_COMMUNITY): Payer: MEDICAID | Admitting: Physician Assistant

## 2023-07-23 ENCOUNTER — Encounter (HOSPITAL_COMMUNITY): Payer: Self-pay | Admitting: Thoracic Surgery (Cardiothoracic Vascular Surgery)

## 2023-07-23 ENCOUNTER — Inpatient Hospital Stay (HOSPITAL_COMMUNITY): Payer: MEDICAID | Admitting: Anesthesiology

## 2023-07-23 ENCOUNTER — Inpatient Hospital Stay (HOSPITAL_COMMUNITY)
Admission: RE | Admit: 2023-07-23 | Discharge: 2023-07-26 | DRG: 164 | Disposition: A | Discharge status: Home / Self Care | Payer: MEDICAID | Attending: Thoracic Surgery (Cardiothoracic Vascular Surgery) | Admitting: Thoracic Surgery (Cardiothoracic Vascular Surgery)

## 2023-07-23 DIAGNOSIS — F17210 Nicotine dependence, cigarettes, uncomplicated: Secondary | ICD-10-CM | POA: Diagnosis present

## 2023-07-23 DIAGNOSIS — I10 Essential (primary) hypertension: Secondary | ICD-10-CM | POA: Diagnosis present

## 2023-07-23 DIAGNOSIS — F259 Schizoaffective disorder, unspecified: Secondary | ICD-10-CM | POA: Diagnosis present

## 2023-07-23 DIAGNOSIS — J9383 Other pneumothorax: Secondary | ICD-10-CM | POA: Diagnosis not present

## 2023-07-23 DIAGNOSIS — Z6824 Body mass index (BMI) 24.0-24.9, adult: Secondary | ICD-10-CM

## 2023-07-23 DIAGNOSIS — I472 Ventricular tachycardia, unspecified: Secondary | ICD-10-CM | POA: Diagnosis not present

## 2023-07-23 DIAGNOSIS — C3412 Malignant neoplasm of upper lobe, left bronchus or lung: Principal | ICD-10-CM | POA: Diagnosis present

## 2023-07-23 DIAGNOSIS — E46 Unspecified protein-calorie malnutrition: Secondary | ICD-10-CM | POA: Diagnosis present

## 2023-07-23 DIAGNOSIS — D72828 Other elevated white blood cell count: Secondary | ICD-10-CM | POA: Diagnosis present

## 2023-07-23 DIAGNOSIS — Z902 Acquired absence of lung [part of]: Principal | ICD-10-CM

## 2023-07-23 DIAGNOSIS — E119 Type 2 diabetes mellitus without complications: Secondary | ICD-10-CM | POA: Diagnosis present

## 2023-07-23 DIAGNOSIS — Z7951 Long term (current) use of inhaled steroids: Secondary | ICD-10-CM | POA: Diagnosis not present

## 2023-07-23 DIAGNOSIS — I7143 Infrarenal abdominal aortic aneurysm, without rupture: Secondary | ICD-10-CM | POA: Diagnosis present

## 2023-07-23 DIAGNOSIS — R911 Solitary pulmonary nodule: Secondary | ICD-10-CM | POA: Diagnosis present

## 2023-07-23 DIAGNOSIS — E785 Hyperlipidemia, unspecified: Secondary | ICD-10-CM | POA: Diagnosis present

## 2023-07-23 DIAGNOSIS — M109 Gout, unspecified: Secondary | ICD-10-CM | POA: Diagnosis present

## 2023-07-23 DIAGNOSIS — Z79899 Other long term (current) drug therapy: Secondary | ICD-10-CM

## 2023-07-23 DIAGNOSIS — E871 Hypo-osmolality and hyponatremia: Secondary | ICD-10-CM | POA: Diagnosis present

## 2023-07-23 DIAGNOSIS — I493 Ventricular premature depolarization: Secondary | ICD-10-CM | POA: Diagnosis present

## 2023-07-23 HISTORY — PX: LYMPH NODE BIOPSY: SHX201

## 2023-07-23 HISTORY — PX: INTERCOSTAL NERVE BLOCK: SHX5021

## 2023-07-23 LAB — PREPARE RBC (CROSSMATCH)

## 2023-07-23 LAB — GLUCOSE, CAPILLARY
Glucose-Capillary: 103 mg/dL — ABNORMAL HIGH (ref 70–99)
Glucose-Capillary: 149 mg/dL — ABNORMAL HIGH (ref 70–99)

## 2023-07-23 LAB — ABO/RH: ABO/RH(D): B NEG

## 2023-07-23 SURGERY — WEDGE RESECTION, LUNG, ROBOT-ASSISTED, THORACOSCOPIC
Anesthesia: General | Site: Chest | Laterality: Left

## 2023-07-23 MED ORDER — ACETAMINOPHEN 500 MG PO TABS
1000.0000 mg | ORAL_TABLET | Freq: Four times a day (QID) | ORAL | Status: DC
  Administered 2023-07-23 – 2023-07-26 (×10): 1000 mg via ORAL
  Filled 2023-07-23 (×11): qty 2

## 2023-07-23 MED ORDER — FENTANYL CITRATE (PF) 100 MCG/2ML IJ SOLN
INTRAMUSCULAR | Status: AC
  Filled 2023-07-23: qty 2

## 2023-07-23 MED ORDER — SENNOSIDES-DOCUSATE SODIUM 8.6-50 MG PO TABS: 1.0000 | ORAL_TABLET | Freq: Every day | ORAL | Status: DC

## 2023-07-23 MED ORDER — SODIUM CHLORIDE 0.9 % IV SOLN: INTRAVENOUS | Status: DC | PRN

## 2023-07-23 MED ORDER — FENTANYL CITRATE (PF) 250 MCG/5ML IJ SOLN
INTRAMUSCULAR | Status: AC
  Filled 2023-07-23: qty 5

## 2023-07-23 MED ORDER — SODIUM CHLORIDE FLUSH 0.9 % IV SOLN
INTRAVENOUS | Status: DC | PRN
  Administered 2023-07-23: 100 mL

## 2023-07-23 MED ORDER — PANTOPRAZOLE SODIUM 40 MG PO TBEC
40.0000 mg | DELAYED_RELEASE_TABLET | Freq: Every day | ORAL | Status: DC
  Administered 2023-07-24 – 2023-07-26 (×3): 40 mg via ORAL
  Filled 2023-07-23 (×3): qty 1

## 2023-07-23 MED ORDER — ALBUMIN HUMAN 5 % IV SOLN: INTRAVENOUS | Status: DC | PRN

## 2023-07-23 MED ORDER — ACETAMINOPHEN 10 MG/ML IV SOLN
1000.0000 mg | Freq: Once | INTRAVENOUS | Status: AC
  Administered 2023-07-23: 1000 mg via INTRAVENOUS

## 2023-07-23 MED ORDER — ORAL CARE MOUTH RINSE: 15.0000 mL | Freq: Once | OROMUCOSAL | Status: AC

## 2023-07-23 MED ORDER — MORPHINE SULFATE (PF) 2 MG/ML IV SOLN
INTRAVENOUS | Status: AC
  Administered 2023-07-23: 2 mg via INTRAVENOUS
  Filled 2023-07-23: qty 1

## 2023-07-23 MED ORDER — PROPOFOL 10 MG/ML IV BOLUS
INTRAVENOUS | Status: DC | PRN
  Administered 2023-07-23: 160 mg via INTRAVENOUS

## 2023-07-23 MED ORDER — SODIUM CHLORIDE 0.9% FLUSH: 10.0000 mL | Freq: Two times a day (BID) | INTRAVENOUS | Status: DC

## 2023-07-23 MED ORDER — CHLORHEXIDINE GLUCONATE 0.12 % MT SOLN
15.0000 mL | Freq: Once | OROMUCOSAL | Status: AC
  Administered 2023-07-23: 15 mL via OROMUCOSAL
  Filled 2023-07-23: qty 15

## 2023-07-23 MED ORDER — ROCURONIUM BROMIDE 10 MG/ML (PF) SYRINGE
PREFILLED_SYRINGE | INTRAVENOUS | Status: DC | PRN
  Administered 2023-07-23 (×2): 20 mg via INTRAVENOUS
  Administered 2023-07-23: 60 mg via INTRAVENOUS
  Administered 2023-07-23: 20 mg via INTRAVENOUS
  Administered 2023-07-23: 10 mg via INTRAVENOUS

## 2023-07-23 MED ORDER — AMISULPRIDE (ANTIEMETIC) 5 MG/2ML IV SOLN: 10.0000 mg | Freq: Once | INTRAVENOUS | Status: DC | PRN

## 2023-07-23 MED ORDER — SUGAMMADEX SODIUM 200 MG/2ML IV SOLN
INTRAVENOUS | Status: DC | PRN
  Administered 2023-07-23: 200 mg via INTRAVENOUS

## 2023-07-23 MED ORDER — ENOXAPARIN SODIUM 40 MG/0.4ML IJ SOSY
40.0000 mg | PREFILLED_SYRINGE | Freq: Every day | INTRAMUSCULAR | Status: DC
  Administered 2023-07-23 – 2023-07-25 (×3): 40 mg via SUBCUTANEOUS
  Filled 2023-07-23 (×3): qty 0.4

## 2023-07-23 MED ORDER — CEFAZOLIN SODIUM-DEXTROSE 2-4 GM/100ML-% IV SOLN
2.0000 g | Freq: Three times a day (TID) | INTRAVENOUS | Status: AC
  Administered 2023-07-23 (×2): 2 g via INTRAVENOUS
  Filled 2023-07-23 (×2): qty 100

## 2023-07-23 MED ORDER — DEXTROSE-SODIUM CHLORIDE 5-0.45 % IV SOLN: INTRAVENOUS | Status: AC

## 2023-07-23 MED ORDER — FENTANYL CITRATE (PF) 100 MCG/2ML IJ SOLN: 25.0000 ug | Freq: Once | INTRAMUSCULAR | Status: DC

## 2023-07-23 MED ORDER — ACETAMINOPHEN 10 MG/ML IV SOLN
INTRAVENOUS | Status: AC
  Filled 2023-07-23: qty 100

## 2023-07-23 MED ORDER — SODIUM CHLORIDE 0.9 % IV SOLN
INTRAVENOUS | Status: AC | PRN
  Administered 2023-07-23: 1000 mL

## 2023-07-23 MED ORDER — BISACODYL 5 MG PO TBEC
10.0000 mg | DELAYED_RELEASE_TABLET | Freq: Every day | ORAL | Status: DC
  Filled 2023-07-23: qty 2

## 2023-07-23 MED ORDER — MIDAZOLAM HCL 2 MG/2ML IJ SOLN
INTRAMUSCULAR | Status: AC
  Filled 2023-07-23: qty 2

## 2023-07-23 MED ORDER — FENTANYL CITRATE (PF) 100 MCG/2ML IJ SOLN
25.0000 ug | INTRAMUSCULAR | Status: DC | PRN
  Administered 2023-07-23 (×2): 25 ug via INTRAVENOUS

## 2023-07-23 MED ORDER — SODIUM CHLORIDE 0.9% IV SOLUTION: Freq: Once | INTRAVENOUS | Status: DC

## 2023-07-23 MED ORDER — FENTANYL CITRATE (PF) 250 MCG/5ML IJ SOLN
INTRAMUSCULAR | Status: DC | PRN
  Administered 2023-07-23: 100 ug via INTRAVENOUS
  Administered 2023-07-23 (×3): 50 ug via INTRAVENOUS

## 2023-07-23 MED ORDER — PHENYLEPHRINE HCL-NACL 20-0.9 MG/250ML-% IV SOLN
INTRAVENOUS | Status: DC | PRN
  Administered 2023-07-23: 25 ug/min via INTRAVENOUS

## 2023-07-23 MED ORDER — EPHEDRINE SULFATE (PRESSORS) 50 MG/ML IJ SOLN
INTRAMUSCULAR | Status: DC | PRN
  Administered 2023-07-23 (×2): 5 mg via INTRAVENOUS

## 2023-07-23 MED ORDER — MIDAZOLAM HCL 5 MG/5ML IJ SOLN
INTRAMUSCULAR | Status: DC | PRN
  Administered 2023-07-23: 2 mg via INTRAVENOUS

## 2023-07-23 MED ORDER — MORPHINE SULFATE (PF) 2 MG/ML IV SOLN
2.0000 mg | INTRAVENOUS | Status: DC | PRN
  Administered 2023-07-23 – 2023-07-26 (×13): 2 mg via INTRAVENOUS
  Filled 2023-07-23 (×14): qty 1

## 2023-07-23 MED ORDER — ONDANSETRON HCL 4 MG/2ML IJ SOLN
INTRAMUSCULAR | Status: DC | PRN
  Administered 2023-07-23: 4 mg via INTRAVENOUS

## 2023-07-23 MED ORDER — ACETAMINOPHEN 500 MG PO TABS
1000.0000 mg | ORAL_TABLET | Freq: Once | ORAL | Status: AC
  Administered 2023-07-23: 1000 mg via ORAL
  Filled 2023-07-23: qty 2

## 2023-07-23 MED ORDER — LACTATED RINGERS IV SOLN: INTRAVENOUS | Status: DC | PRN

## 2023-07-23 MED ORDER — PHENYLEPHRINE HCL (PRESSORS) 10 MG/ML IV SOLN
INTRAVENOUS | Status: DC | PRN
  Administered 2023-07-23 (×2): 160 ug via INTRAVENOUS
  Administered 2023-07-23 (×4): 80 ug via INTRAVENOUS

## 2023-07-23 MED ORDER — CEFAZOLIN SODIUM-DEXTROSE 2-4 GM/100ML-% IV SOLN
2.0000 g | INTRAVENOUS | Status: AC
Start: 2023-07-23 — End: 2023-07-23
  Administered 2023-07-23: 2 g via INTRAVENOUS
  Filled 2023-07-23: qty 100

## 2023-07-23 MED ORDER — CEFAZOLIN SODIUM-DEXTROSE 2-4 GM/100ML-% IV SOLN
INTRAVENOUS | Status: AC
  Filled 2023-07-23: qty 100

## 2023-07-23 MED ORDER — BUPIVACAINE LIPOSOME 1.3 % IJ SUSP
INTRAMUSCULAR | Status: AC
  Filled 2023-07-23: qty 20

## 2023-07-23 MED ORDER — PROPOFOL 10 MG/ML IV BOLUS
INTRAVENOUS | Status: AC
  Filled 2023-07-23: qty 20

## 2023-07-23 MED ORDER — ONDANSETRON HCL 4 MG/2ML IJ SOLN: 4.0000 mg | Freq: Four times a day (QID) | INTRAMUSCULAR | Status: DC | PRN

## 2023-07-23 MED ORDER — LIDOCAINE 2% (20 MG/ML) 5 ML SYRINGE
INTRAMUSCULAR | Status: DC | PRN
  Administered 2023-07-23: 60 mg via INTRAVENOUS

## 2023-07-23 MED ORDER — HEMOSTATIC AGENTS (NO CHARGE) OPTIME
TOPICAL | Status: DC | PRN
  Administered 2023-07-23: 1 via TOPICAL

## 2023-07-23 MED ORDER — OXYCODONE HCL 5 MG PO TABS
5.0000 mg | ORAL_TABLET | ORAL | Status: DC | PRN
  Administered 2023-07-23 – 2023-07-25 (×9): 10 mg via ORAL
  Administered 2023-07-25: 5 mg via ORAL
  Administered 2023-07-26 (×2): 10 mg via ORAL
  Filled 2023-07-23 (×12): qty 2

## 2023-07-23 MED ORDER — BUPIVACAINE HCL (PF) 0.5 % IJ SOLN
INTRAMUSCULAR | Status: AC
Start: 2023-07-23 — End: ?
  Filled 2023-07-23: qty 30

## 2023-07-23 MED ORDER — TRAMADOL HCL 50 MG PO TABS
50.0000 mg | ORAL_TABLET | Freq: Four times a day (QID) | ORAL | Status: DC | PRN
Start: 2023-07-23 — End: 2023-07-24

## 2023-07-23 MED ORDER — ACETAMINOPHEN 160 MG/5ML PO SOLN: 1000.0000 mg | Freq: Four times a day (QID) | ORAL | Status: DC

## 2023-07-23 MED ORDER — 0.9 % SODIUM CHLORIDE (POUR BTL) OPTIME
TOPICAL | Status: DC | PRN
  Administered 2023-07-23: 2000 mL

## 2023-07-23 MED ORDER — ALBUTEROL SULFATE (2.5 MG/3ML) 0.083% IN NEBU
2.5000 mg | INHALATION_SOLUTION | RESPIRATORY_TRACT | Status: DC
  Administered 2023-07-23 – 2023-07-25 (×7): 2.5 mg via RESPIRATORY_TRACT
  Filled 2023-07-23 (×8): qty 3

## 2023-07-23 SURGICAL SUPPLY — 91 items
ADH SKN CLS APL DERMABOND .7 (GAUZE/BANDAGES/DRESSINGS) ×1
APPLIER CLIP ROT 10 11.4 M/L (STAPLE)
APR CLP MED LRG 11.4X10 (STAPLE)
BAG SPEC RTRVL C1550 15 (MISCELLANEOUS) ×1
BAG TISS RTRVL C300 12X14 (MISCELLANEOUS) ×1
BLADE CLIPPER SURG (BLADE) ×1 IMPLANT
CANISTER SUCT 3000ML PPV (MISCELLANEOUS) ×2 IMPLANT
CANNULA REDUCER 12-8 DVNC XI (CANNULA) ×2 IMPLANT
CLIP APPLIE ROT 10 11.4 M/L (STAPLE) IMPLANT
CNTNR URN SCR LID CUP LEK RST (MISCELLANEOUS) ×5 IMPLANT
CONN ST 1/4X3/8 BEN (MISCELLANEOUS) IMPLANT
CONT SPEC 4OZ STRL OR WHT (MISCELLANEOUS) ×10
DEFOGGER SCOPE WARMER CLEARIFY (MISCELLANEOUS) ×1 IMPLANT
DERMABOND ADVANCED .7 DNX12 (GAUZE/BANDAGES/DRESSINGS) ×1 IMPLANT
DRAIN CHANNEL 28F RND 3/8 FF (WOUND CARE) IMPLANT
DRAIN CHANNEL 32F RND 10.7 FF (WOUND CARE) IMPLANT
DRAPE ARM DVNC X/XI (DISPOSABLE) ×4 IMPLANT
DRAPE COLUMN DVNC XI (DISPOSABLE) ×1 IMPLANT
DRAPE CV SPLIT W-CLR ANES SCRN (DRAPES) ×1 IMPLANT
DRAPE HALF SHEET 40X57 (DRAPES) ×1 IMPLANT
DRAPE INCISE IOBAN 66X45 STRL (DRAPES) IMPLANT
DRAPE SURG ORHT 6 SPLT 77X108 (DRAPES) ×1 IMPLANT
ELECT BLADE 6.5 EXT (BLADE) IMPLANT
ELECT REM PT RETURN 9FT ADLT (ELECTROSURGICAL) ×1
ELECTRODE REM PT RTRN 9FT ADLT (ELECTROSURGICAL) ×1 IMPLANT
FORCEPS BPLR FENES DVNC XI (FORCEP) IMPLANT
FORCEPS BPLR LNG DVNC XI (INSTRUMENTS) IMPLANT
GAUZE KITTNER 4X5 RF (MISCELLANEOUS) ×2 IMPLANT
GAUZE SPONGE 4X4 12PLY STRL (GAUZE/BANDAGES/DRESSINGS) ×1 IMPLANT
GLOVE SS BIOGEL STRL SZ 7.5 (GLOVE) ×1 IMPLANT
GOWN STRL REUS W/ TWL LRG LVL3 (GOWN DISPOSABLE) ×2 IMPLANT
GOWN STRL REUS W/ TWL XL LVL3 (GOWN DISPOSABLE) ×2 IMPLANT
GOWN STRL REUS W/TWL 2XL LVL3 (GOWN DISPOSABLE) ×1 IMPLANT
GOWN STRL REUS W/TWL LRG LVL3 (GOWN DISPOSABLE) ×2
GOWN STRL REUS W/TWL XL LVL3 (GOWN DISPOSABLE) ×2
GRASPER TIP-UP FEN DVNC XI (INSTRUMENTS) IMPLANT
HEMOSTAT SURGICEL 2X14 (HEMOSTASIS) ×3 IMPLANT
IRRIGATION STRYKERFLOW (MISCELLANEOUS) ×1 IMPLANT
IRRIGATOR STRYKERFLOW (MISCELLANEOUS) ×1
KIT BASIN OR (CUSTOM PROCEDURE TRAY) ×1 IMPLANT
KIT SUCTION CATH 14FR (SUCTIONS) IMPLANT
KIT TURNOVER KIT B (KITS) ×1 IMPLANT
NDL HYPO 25GX1X1/2 BEV (NEEDLE) ×1 IMPLANT
NDL SPNL 22GX3.5 QUINCKE BK (NEEDLE) ×1 IMPLANT
NEEDLE HYPO 25GX1X1/2 BEV (NEEDLE) ×1 IMPLANT
NEEDLE SPNL 22GX3.5 QUINCKE BK (NEEDLE) ×1 IMPLANT
NS IRRIG 1000ML POUR BTL (IV SOLUTION) ×1 IMPLANT
PACK CHEST (CUSTOM PROCEDURE TRAY) ×1 IMPLANT
PAD ARMBOARD 7.5X6 YLW CONV (MISCELLANEOUS) ×2 IMPLANT
PORT ACCESS TROCAR AIRSEAL 12 (TROCAR) ×1 IMPLANT
RELOAD STAPLE 45 2.5 WHT DVNC (STAPLE) IMPLANT
RELOAD STAPLE 45 3.5 BLU DVNC (STAPLE) IMPLANT
RELOAD STAPLE 45 4.3 GRN DVNC (STAPLE) IMPLANT
RELOAD STAPLE 45 4.6 BLK DVNC (STAPLE) IMPLANT
RELOAD STAPLER 2.5X45 WHT DVNC (STAPLE) ×6 IMPLANT
RELOAD STAPLER 3.5X45 BLU DVNC (STAPLE) ×4 IMPLANT
RELOAD STAPLER 4.3X45 GRN DVNC (STAPLE) ×9 IMPLANT
RELOAD STAPLER 45 4.6 BLK DVNC (STAPLE) ×1 IMPLANT
SCISSORS LAP 5X35 DISP (ENDOMECHANICALS) IMPLANT
SEAL UNIV 5-12 XI (MISCELLANEOUS) ×4 IMPLANT
SET TRI-LUMEN FLTR TB AIRSEAL (TUBING) ×1 IMPLANT
SOL ELECTROSURG ANTI STICK (MISCELLANEOUS) ×1
SOLUTION ELECTROSURG ANTI STCK (MISCELLANEOUS) ×1 IMPLANT
SPONGE TONSIL 1 RF SGL (DISPOSABLE) IMPLANT
STAPLER 45 SUREFORM CVD DVNC (STAPLE) IMPLANT
STAPLER RELOAD 2.5X45 WHT DVNC (STAPLE) ×6
STAPLER RELOAD 3.5X45 BLU DVNC (STAPLE) ×4
STAPLER RELOAD 4.3X45 GRN DVNC (STAPLE) ×9
STAPLER RELOAD 45 4.6 BLK DVNC (STAPLE) ×1
SUT PDS AB 3-0 SH 27 (SUTURE) IMPLANT
SUT PROLENE 4 0 RB 1 (SUTURE)
SUT PROLENE 4-0 RB1 .5 CRCL 36 (SUTURE) IMPLANT
SUT SILK 1 MH (SUTURE) ×2 IMPLANT
SUT SILK 2 0 SH (SUTURE) IMPLANT
SUT SILK 2 0SH CR/8 30 (SUTURE) IMPLANT
SUT SILK 3 0SH CR/8 30 (SUTURE) IMPLANT
SUT VIC AB 1 CTX 36 (SUTURE) ×1
SUT VIC AB 1 CTX36XBRD ANBCTR (SUTURE) IMPLANT
SUT VIC AB 2-0 CTX 36 (SUTURE) IMPLANT
SUT VIC AB 3-0 X1 27 (SUTURE) ×2 IMPLANT
SUT VICRYL 0 TIES 12 18 (SUTURE) ×1 IMPLANT
SUT VICRYL 0 UR6 27IN ABS (SUTURE) ×2 IMPLANT
SYR 20ML LL LF (SYRINGE) ×2 IMPLANT
SYSTEM RETRIEVAL ANCHOR 12 (MISCELLANEOUS) IMPLANT
SYSTEM RETRIEVAL ANCHOR 15 (MISCELLANEOUS) IMPLANT
SYSTEM SAHARA CHEST DRAIN ATS (WOUND CARE) ×1 IMPLANT
TAPE CLOTH 4X10 WHT NS (GAUZE/BANDAGES/DRESSINGS) ×1 IMPLANT
TIP APPLICATOR SPRAY EXTEND 16 (VASCULAR PRODUCTS) IMPLANT
TOWEL GREEN STERILE (TOWEL DISPOSABLE) ×2 IMPLANT
TRAY FOLEY MTR SLVR 16FR STAT (SET/KITS/TRAYS/PACK) ×1 IMPLANT
WATER STERILE IRR 1000ML POUR (IV SOLUTION) ×1 IMPLANT

## 2023-07-23 NOTE — Hospital Course (Addendum)
  Referring Provider is Icard, Rachel Bo, DO     HPI at time of CT surgical consultation: [name] is a 62 year old man with a history of tobacco abuse, alcoholism, bipolar 1 disorder, schizoaffective schizophrenia, depression, type 2 diabetes without complication, gout and hyperlipidemia.  He recently saw a PA at the [facility] for a refill of his Indocin which he takes for gout.  Because of his smoking history it was recommended that he have a low-dose CT for lung cancer screening.  That showed an 8 mm left upper lobe lung nodule.  He was referred to Dr. Tonia Brooms.  PET/CT showed the nodule was hypermetabolic with an SUV of 5.  He now sent for consideration for surgical resection.   He continues to smoke about a pack a day.  Overall has about an 80-pack-year history (2 PPD x 40 years).  Currently lives with his significant other.  No chest pain, pressure, tightness, or shortness of breath.  Can walk up a flight of stairs.  Would be tired with 2 flights.  No change in appetite or weight loss.   He saw Dr. Marina Goodell and had colonoscopy which was ok.  Dr. Dorris Fetch evaluated the patient and studies and recommended proceeding with robotic assisted resection.  He was admitted this hospitalization for the procedure.  Hospital course: Patient was admitted electively on 07/23/2023 and taken the operating room at which time he underwent a left upper lobe wedge resection with completion lobectomy.  Initially he had lymph node dissection.  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  Patient tolerated procedure well was taken to the PACU in stable condition.  Postoperative hospital course:  The patient has done well.  Vitals have remained stable and he has remained afebrile.  Oxygen was weaned without difficulty and he maintained good saturations on room air.  He was voiding well and renal function has remained normal.  Chest tubes were removed on postoperative day #2.  He has a small left  apical pneumothorax/space which has improved over time.  He has a borderline expected acute blood loss anemia and values have remained stable.  Most recent hemoglobin hematocrit dated 07/25/2023 were 12.8/39.1 respectively.  Incisions are healing well without evidence of infection.  He is tolerating diet.  He tolerated gradually increasing activities using standard post surgical protocols.  Overall, at the time of discharge the patient was felt to be quite stable.

## 2023-07-23 NOTE — Interval H&P Note (Signed)
History and Physical Interval Note:  07/23/2023 7:48 AM  [name]  has presented today for surgery, with the diagnosis of LUL NODULE.  The various methods of treatment have been discussed with the patient and family. After consideration of risks, benefits and other options for treatment, the patient has consented to  Procedure(s): XI ROBOTIC ASSISTED THORACOSCOPY-LEFT UPPER LOBE WEDGE RESECTION vs segmentectomy (Left) as a surgical intervention.  The patient's history has been reviewed, patient examined, no change in status, stable for surgery.  I have reviewed the patient's chart and labs.  Questions were answered to the patient's satisfaction.     [name]

## 2023-07-23 NOTE — Anesthesia Procedure Notes (Signed)
Procedure Name: Intubation Date/Time: 07/23/2023 8:20 AM  Performed by: Margarita Rana, CRNAPre-anesthesia Checklist: Patient identified, Patient being monitored, Timeout performed, Emergency Drugs available and Suction available Patient Re-evaluated:Patient Re-evaluated prior to induction Oxygen Delivery Method: Circle System Utilized Preoxygenation: Pre-oxygenation with 100% oxygen Induction Type: IV induction Ventilation: Mask ventilation without difficulty and Oral airway inserted - appropriate to patient size Laryngoscope Size: Mac and 4 Grade View: Grade I Tube type: Oral Endobronchial tube: Double lumen EBT and Left and 41 Fr Number of attempts: 1 Airway Equipment and Method: Stylet Placement Confirmation: ETT inserted through vocal cords under direct vision, positive ETCO2 and breath sounds checked- equal and bilateral Tube secured with: Tape Dental Injury: Teeth and Oropharynx as per pre-operative assessment

## 2023-07-23 NOTE — Op Note (Signed)
NAMEGROVER, Troy MEDICAL RECORD Scott: [id number] ACCOUNT Scott: [id number] DATE OF BIRTH: Feb 09, 1961 FACILITY: [facility] LOCATION: [facility] PHYSICIAN: Salvatore Decent. Dorris Fetch, MD  Operative Report   DATE OF PROCEDURE: 07/23/2023  PREOPERATIVE DIAGNOSIS:  Left upper lobe lung nodule.  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  PROCEDURE:   Xi robotic-assisted left upper lobe wedge resection, Left upper lobectomy,  Lymph node dissection, and  Intercostal nerve blocks levels 3 through 10.  SURGEON:  Salvatore Decent. Dorris Fetch, MD  ASSISTANT:  Gershon Crane, PA-C  ANESTHESIA:  General.  FINDINGS:  Adhesions at apex.  Lungs slow to deflate.  Nodule adherent to and possibly invading mediastinal pleura superior to the aorta.  Frozen section revealed adenocarcinoma.  Anatomy unfavorable for segmentectomy.  Therefore, lobectomy performed.  CLINICAL NOTE:  [name] is a 62 year old man with a history of tobacco use who was found to have a lung nodule on a low-dose CT for lung cancer screening.  On PET CT, the nodule was markedly hypermetabolic for its size.  He was sent for surgical  resection.  The indications, risks, benefits, and alternatives were discussed in detail with the patient.  He understood and accepted the risks and agreed to proceed.  The plan would be to do a wedge resection or segmentectomy if appropriate or possibly  lobectomy if necessary.  OPERATIVE NOTE:  [name] was brought to the operating room on 07/23/2023.  He had induction of general anesthesia and was intubated with a double-lumen endotracheal tube.  Intravenous antibiotics were administered.  A Foley catheter was placed.  Sequential compression devices were placed on the calves for DVT prophylaxis.  He was placed in the right lateral decubitus position.  A Bair Hugger was placed for active warming.  The left chest was prepped and draped in the usual  sterile fashion.  Single-lung ventilation of the right lung was initiated and was tolerated well throughout the procedure.  A timeout was performed.  A solution containing 20 mL of liposomal bupivacaine, 30 mL of 0.5% bupivacaine, and 50 mL of saline was prepared.  This solution was used for local at the incision sites as well as for the intercostal nerve blocks.  An incision was made in the eighth interspace in approximately the midaxillary line.  An 8-mm robotic port was inserted.  The thoracoscope was advanced into the chest.  After confirming intrapleural placement, carbon dioxide was insufflated per protocol.  A 12-mm robotic port was placed in the 8th interspace anterior to the camera port and a 12-mm AirSeal port was placed in the 10th interspace centered between the two robotic ports.  Intercostal nerve blocks were performed from the 3rd to the 10th interspace by injecting 10 mL of the bupivacaine solution into a subpleural plane at each level.  Two additional 8th interspace robotic ports were placed.  The robot was deployed.  The camera arm was docked.  Targeting was performed.  The remaining arms were docked.  The robotic instruments were inserted with thoracoscopic visualization.  There were adhesions of the left upper lobe to the apex.  These were taken down with bipolar cautery.  Working posteriorly, the area of the nodule was easily identifiable.  There were adhesions and possible invasion into the mediastinal pleura superior to the aorta.  Bipolar cautery was used to remove a margin of the mediastinal pleura and fat along with the nodule.  A wedge resection was performed with sequential  firings of  the robotic stapler.  The specimen was placed into an endoscopic retrieval bag, removed, and sent for frozen section.  While awaiting the results of the frozen section, the lymph node dissection was initiated.  The lower lobe was retracted superiorly.  The inferior ligament was divided.  A level 9 node  was removed.  The lung was retracted anteriorly and the pleural reflection was divided at the hilum posteriorly.  The level 7 and 8 nodes were removed.  Dissection was carried on to the pulmonary artery up into the fissure.  Working more superiorly, level 10 and level 5 nodes were removed.  Some of the nodes were enlarged, but all appeared benign grossly.  At this point, the frozen section returned showing adenocarcinoma.  The decision was made to attempt a segmentectomy.  The lung was retracted posteriorly and the pleura was divided over the superior pulmonary vein branches, and then in the hilum, level 11 nodes were removed from the bifurcation of the upper and lower lobe bronchus.  The visualization in the fissure was difficult as the fissure was incomplete.  Once the pulmonary artery was identified, the fissure was completed with staples.  Inspection of the airway showed the lingular branches and the large anterior apical trunk were directly adjacent to the bronchus and attempts at dissecting the bronchus out by removing lymph nodes did not provide a good plane to attempt to divide the airway without dividing both the anterior apical trunk and the lingular arterial branches.  The decision was made to proceed with a lobectomy.  The posterior PA branches had been encircled and divided with a robotic stapler during this evaluation.  The superior pulmonary vein was encircled and divided with a robotic stapler.  Next, the lingular arterial branches were encircled and divided.  Additional lymph nodes were removed.  The anterior apical trunk was divided.    The staple then was placed across the left upper lobe bronchus at its origin.  A test inflation showed aeration of the lower lobe.  The staple was fired transecting the left upper lobe bronchus.  The vessel loop and sponges used during the dissection  were removed.  The chest was copiously irrigated with saline.  A test inflation to 30 cm of water revealed Scott air  leakage.  The upper lobe was placed into an endoscopic retrieval bag and brought down to the inferior aspect of the chest.  The robotic instruments were removed.  The robot was undocked.  The anterior 8th interspace incision was lengthened to approximately 3 cm.  The specimen was removed and sent for frozen section of the bronchial margin, which subsequently returned with Scott tumor seen.  The chest was again irrigated with saline.  All port sites were inspected.  There was Scott ongoing bleeding.  A 28-French Blake drain was placed through the original port incision under direct view of to the apex and was secured with a #1 silk suture.  Dual-lung ventilation was resumed.  The remaining incisions were closed in standard fashion.  The chest tube was placed to a pleural evac on waterseal.  The patient was placed back in the supine position.  He was extubated in the operating room and taken to the postanesthesia care unit in good condition.  All sponge, needle, and instrument counts were correct at the end of the procedure.  Experienced assistance was necessary for this case due to surgical complexity.  Gershon Crane, [place] served as the first Geophysicist/field seismologist providing assistance with port placement, Company secretary,  robot docking and undocking, instrument exchange, specimen retrieval,  suctioning, and wound closure.   PUS D: 07/23/2023 6:24:58 pm T: 07/23/2023 7:42:00 pm  JOB: [id number]/ [id number]

## 2023-07-23 NOTE — Brief Op Note (Addendum)
07/23/2023  12:03 PM  PATIENT:  [name]  62 y.o. male  PRE-OPERATIVE DIAGNOSIS:  LUL NODULE  POST-OPERATIVE DIAGNOSIS:  ADENOCARCINOMA LEFT UPPER LOBE_-CLINICAL STAGE IA (T1,N0) vs (T3, NO)  PROCEDURE:  Procedure(s): XI ROBOTIC ASSISTED THORACOSCOPY-LEFT UPPER LOBE WEDGE RESECTION vs segmentectomy (Left) LYMPH NODE BIOPSY (Left) INTERCOSTAL NERVE BLOCK (Left) LEFT UPPER  LOBECTOMY  SURGEON:  Surgeons and Role:    * [name], MD - Primary  PHYSICIAN ASSISTANT: WAYNE GOLD PA-C  ANESTHESIA:   general  EBL:   100 ML  BLOOD ADMINISTERED:none  DRAINS: (1 28 F) Blake drain(s) in the LEFT HEMITHORAX    LOCAL MEDICATIONS USED:  BUPIVICAINE  and EXPAREL  SPECIMEN:  Source of Specimen:  LUL WEDGE/COMPLETION LOBECTOMY AND LN SAMPLES  DISPOSITION OF SPECIMEN:  PATHOLOGY FROZEN: ADENOCARCINOMA  COUNTS:  YES  TOURNIQUET:  * No tourniquets in log *  DICTATION: .Other Dictation: Dictation Number [id number]  PLAN OF CARE: Admit to inpatient   PATIENT DISPOSITION:  PACU - hemodynamically stable.   Delay start of Pharmacological VTE agent (>24hrs) due to surgical blood loss or risk of bleeding: no  COMPLICATIONS: NO KNOWN  FINDINGS- nodule adherent to mediastinal pleura, ? Invasion.  Anatomy not favorable for segmentectomy.  Bronchial margin negative.

## 2023-07-23 NOTE — H&P (Signed)
[address]       [name] [address]             [phone]    Mr. Wurth is sent for consultation regarding a left upper lobe lung nodule.   [name] is a 62 year old man with a history of tobacco abuse, alcoholism, bipolar 1 disorder, schizoaffective schizophrenia, depression, type 2 diabetes without complication, gout and hyperlipidemia.  He recently saw a PA at the [facility] for a refill of his Indocin which he takes for gout.  Because of his smoking history it was recommended that he have a low-dose CT for lung cancer screening.  That showed an 8 mm left upper lobe lung nodule.  He was referred to Dr. Tonia Brooms.  PET/CT showed the nodule was hypermetabolic with an SUV of 5.  He now sent for consideration for surgical resection.   He continues to smoke about a pack a day.  Overall has about an 80-pack-year history (2 PPD x 40 years).  Currently lives with his significant other.  No chest pain, pressure, tightness, or shortness of breath.  Can walk up a flight of stairs.  Would be tired with 2 flights.  No change in appetite or weight loss.   He saw Dr. Marina Goodell and had colonoscopy which was ok. Zubrod Score: At the time of surgery this patient's most appropriate activity status/level should be described as: []     0    Normal activity, no symptoms [x]     1    Restricted in physical strenuous activity but ambulatory, able to do out light work []     2    Ambulatory and capable of self care, unable to do work activities, up and about >50 % of waking hours                              []     3    Only limited self care, in bed greater than 50% of waking hours []     4    Completely disabled, no self care, confined to bed or chair []     5    Moribund       Past Medical History:  Diagnosis Date   Alcoholic (HCC)     Bipolar 1 disorder (HCC)     Depression     Diabetes mellitus without complication (HCC)     Homeless     Schizoaffective schizophrenia (HCC)                  Past Surgical History:  Procedure Laterality Date   DENTAL SURGERY        wisdom teeth          History reviewed. No pertinent family history.       Social History Social History  Social History         Tobacco Use   Smoking status: Every Day      Current packs/day: 2.00      Types: Cigarettes   Smokeless tobacco: Never   Tobacco comments:      7 packs of cigarettes in a week. 05/13/23 Tay  Vaping Use   Vaping status: Never Used  Substance Use Topics   Alcohol use: Yes   Drug use: No              Current Outpatient Medications  Medication Sig Dispense Refill   cetirizine (ZYRTEC) 10  MG tablet Take 10 mg by mouth daily.       Fluticasone-Umeclidin-Vilant (TRELEGY ELLIPTA) 100-62.5-25 MCG/ACT AEPB Inhale 1 puff into the lungs daily.       ibuprofen (ADVIL,MOTRIN) 800 MG tablet Take 1 tablet (800 mg total) by mouth every 8 (eight) hours as needed. 21 tablet 0   indomethacin (INDOCIN) 50 MG capsule Take 1 capsule (50 mg total) by mouth 3 (three) times daily with meals. 50 capsule 2      No current facility-administered medications for this visit.        Allergies  No Known Allergies     Review of Systems  Constitutional:  Negative for activity change, fatigue and unexpected weight change.  HENT:  Negative for trouble swallowing and voice change.   Eyes:  Negative for visual disturbance.  Respiratory:  Positive for cough. Negative for shortness of breath and wheezing.   Cardiovascular:  Negative for chest pain and leg swelling.  Gastrointestinal:  Negative for abdominal pain.  Genitourinary:  Negative for difficulty urinating and dysuria.  Musculoskeletal:  Positive for arthralgias. Negative for gait problem.  Neurological:  Negative for seizures and weakness.  Hematological:  Negative for adenopathy. Does not bruise/bleed easily.  Psychiatric/Behavioral:  Positive for dysphoric mood. The patient is nervous/anxious.   All other systems  reviewed and are negative.     BP (!) 146/80 (BP Location: Right Arm, Patient Position: Sitting)   Pulse 91   Resp 20   Ht 6' (1.829 m)   Wt 177 lb (80.3 kg)   SpO2 96% Comment: RA  BMI 24.01 kg/m  Physical Exam Vitals reviewed.  Constitutional:      General: He is not in acute distress.    Appearance: Normal appearance.  HENT:     Head: Normocephalic and atraumatic.  Eyes:     General: No scleral icterus.    Extraocular Movements: Extraocular movements intact.  Cardiovascular:     Rate and Rhythm: Normal rate and regular rhythm.     Heart sounds: Normal heart sounds. No murmur heard.    No friction rub. No gallop.  Pulmonary:     Effort: No respiratory distress.     Breath sounds: Normal breath sounds. No wheezing or rales.  Abdominal:     General: There is no distension.     Palpations: Abdomen is soft.  Skin:    General: Skin is warm and dry.  Neurological:     General: No focal deficit present.     Mental Status: He is alert and oriented to person, place, and time.     Cranial Nerves: No cranial nerve deficit.     Motor: No weakness.     Diagnostic Tests: NUCLEAR MEDICINE PET SKULL BASE TO THIGH   TECHNIQUE: 8.7 mCi F-18 FDG was injected intravenously. Full-ring PET imaging was performed from the skull base to thigh after the radiotracer. CT data was obtained and used for attenuation correction and anatomic localization.   Fasting blood glucose: 94 mg/dl   COMPARISON:  CT chest dated 10/16/2017   FINDINGS: Mediastinal blood pool activity: SUV max 2.6   Liver activity: SUV max NA   NECK: No hypermetabolic cervical lymphadenopathy.   Incidental CT findings: None.   CHEST: 13 x 6 mm solid nodule in the medial left upper lobe (series 7/image 16), max SUV 5.1, suspicious for primary bronchogenic carcinoma.   No hypermetabolic thoracic lymphadenopathy.   Incidental CT findings: Atherosclerotic calcifications of the aortic arch. Mild coronary  atherosclerosis of  the LAD and left circumflex. Mild paraseptal emphysematous changes, upper lung predominant.   ABDOMEN/PELVIS: No abnormal hypermetabolism in the liver, spleen, pancreas, or adrenal glands.   Mild focal hypermetabolism in the medial cecum/ascending colon, max SUV 4.0. Associated 2.0 x 2.9 cm low-density lesion with macroscopic fat near the ileocecal valve (series 4/image 152), favoring a benign lipoma. However, follow-up colonoscopy is suggested.   No hypermetabolic abdominopelvic lymphadenopathy.   Incidental CT findings: Atherosclerotic calcifications of the abdominal aorta and branch vessels. Focal outpouching along the right posterolateral infrarenal abdominal aorta, with resulting 3.6 x 2.6 cm saccular aneurysm (series 4/image 144). Left colonic diverticulosis, without evidence of diverticulitis. Postsurgical changes related to prior right inguinal hernia repair. Tiny fat containing left femoral hernia.   SKELETON: No focal hypermetabolic activity to suggest skeletal metastasis.   Incidental CT findings: Mild degenerative changes of the visualized thoracolumbar spine.   IMPRESSION: 13 mm solid nodule in the medial left upper lobe, suspicious for primary bronchogenic carcinoma.   No evidence of metastatic disease.   3.6 cm infrarenal saccular abdominal aortic aneurysm.   Recommend vascular consultation.   Reference: J Vasc Surg 7829;56:2-13.   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 Follow-up colonoscopy is suggested for confirmation.     Electronically Signed   By: Charline Bills M.D.   On: 05/14/2023 22:59 I personally reviewed the PET/CT images.  9 x 6 mm (by my measurements) nodule in medial aspect of left upper lobe abutting mediastinum.  Markedly hypermetabolic.  Hypermetabolism in ileocecal region also noted.   Pulmonary function testing 06/10/2023 FVC 3.38 (66%) FEV1 2.09 (54%) FEV1 2.32 (61%) postbronchodilator DLCO 18.01  (62%)   Impression: [name] is a 62 year old man with a history of tobacco abuse, alcoholism, bipolar 1 disorder, schizoaffective schizophrenia, depression, type 2 diabetes without complication, gout and hyperlipidemia.     Left upper lobe lung nodule-9 x 6 mm.  Significant hypermetabolic activity for a nodule that size.  Findings are consistent with a new primary bronchogenic carcinoma.  Infectious and inflammatory nodules are also in the differential diagnosis but are far less likely.  This has to be considered a lung cancer and less can be proven otherwise.   Nodule is in an area that is not favorable for biopsy.  I think the best option would be to proceed for surgical resection for definitive diagnosis and treatment at the same setting.   I described the postoperative procedure to Mr. Virgen and his significant other.  The plan would be to do a robotic assisted left upper lobe wedge resection or segmentectomy, or less likely lobectomy depending on intraoperative findings.  I informed them of the need for general anesthesia, the incisions to be used, the use of the surgical robot, the use of a drainage tube postoperatively, the expected hospital stay, and the overall recovery.  I informed them of the indications, risks, benefits, and alternatives.  They understand the risks include, but are not limited to death, MI, DVT, PE, bleeding, possible need for transfusion, infection, prolonged air leak, cardiac arrhythmias, pain issues, as well as possibility of other unforeseeable complications.   He accepts the risks and agrees to proceed.   Tobacco abuse-emphasized the importance of tobacco cessation for both short and long-term health.   Infrarenal AAA-3.6 cm.  No indication for intervention but will need referral to vascular surgery.   Hypermetabolic area in ileocecum- was evaluated by Dr. Marina Goodell   Plan: Robotic assisted left upper lobe wedge resection versus segmentectomy,  possible  lobectomy on 07/23/2023.   [name], MD [facility] [phone]

## 2023-07-23 NOTE — Anesthesia Procedure Notes (Signed)
Arterial Line Insertion Start/End10/31/2024 7:25 AM, 07/23/2023 7:35 AM Performed by: Atilano Median, DO, anesthesiologist  Patient location: Pre-op. Preanesthetic checklist: patient identified, IV checked, site marked, risks and benefits discussed, surgical consent, monitors and equipment checked, pre-op evaluation, timeout performed and anesthesia consent Lidocaine 1% used for infiltration Right, radial was placed Catheter size: 20 G Hand hygiene performed , maximum sterile barriers used  and Seldinger technique used Allen's test indicative of satisfactory collateral circulation Attempts: 2 Procedure performed using ultrasound guided technique. Ultrasound Notes:anatomy identified, needle tip was noted to be adjacent to the nerve/plexus identified and no ultrasound evidence of intravascular and/or intraneural injection Following insertion, dressing applied and Biopatch. Post procedure assessment: normal and unchanged  Patient tolerated the procedure well with no immediate complications.

## 2023-07-23 NOTE — Transfer of Care (Signed)
Immediate Anesthesia Transfer of Care Note  Patient: [name]  Procedure(s) Performed: XI ROBOTIC ASSISTED THORACOSCOPY-LEFT UPPER LOBE WEDGE RESECTION vs segmentectomy (Left: Chest) LYMPH NODE BIOPSY (Left: Chest) INTERCOSTAL NERVE BLOCK (Left: Chest)  Patient Location: PACU  Anesthesia Type:General  Level of Consciousness: awake, alert , oriented, patient cooperative, and responds to stimulation  Airway & Oxygen Therapy: Patient Spontanous Breathing  Post-op Assessment: Report given to RN and Post -op Vital signs reviewed and stable  Post vital signs: Reviewed and stable  Last Vitals:  Vitals Value Taken Time  BP 125/83 07/23/23 1231  Temp    Pulse 80 07/23/23 1234  Resp 16 07/23/23 1234  SpO2 97 % 07/23/23 1234  Vitals shown include unfiled device data.  Last Pain:  Vitals:   07/23/23 0637  TempSrc:   PainSc: 0-No pain         Complications: No notable events documented.

## 2023-07-24 ENCOUNTER — Encounter (HOSPITAL_COMMUNITY): Payer: Self-pay | Admitting: Thoracic Surgery (Cardiothoracic Vascular Surgery)

## 2023-07-24 ENCOUNTER — Inpatient Hospital Stay (HOSPITAL_COMMUNITY): Payer: MEDICAID

## 2023-07-24 LAB — POCT I-STAT 7, (LYTES, BLD GAS, ICA,H+H)
Acid-base deficit: 4 mmol/L — ABNORMAL HIGH (ref 0.0–2.0)
Bicarbonate: 22.5 mmol/L (ref 20.0–28.0)
Calcium, Ion: 1.22 mmol/L (ref 1.15–1.40)
HCT: 35 % — ABNORMAL LOW (ref 39.0–52.0)
Hemoglobin: 11.9 g/dL — ABNORMAL LOW (ref 13.0–17.0)
O2 Saturation: 100 %
Potassium: 3.9 mmol/L (ref 3.5–5.1)
Sodium: 137 mmol/L (ref 135–145)
TCO2: 24 mmol/L (ref 22–32)
pCO2 arterial: 45.3 mm[Hg] (ref 32–48)
pH, Arterial: 7.303 — ABNORMAL LOW (ref 7.35–7.45)
pO2, Arterial: 347 mm[Hg] — ABNORMAL HIGH (ref 83–108)

## 2023-07-24 LAB — CBC
HCT: 39.1 % (ref 39.0–52.0)
Hemoglobin: 12.4 g/dL — ABNORMAL LOW (ref 13.0–17.0)
MCH: 30 pg (ref 26.0–34.0)
MCHC: 31.7 g/dL (ref 30.0–36.0)
MCV: 94.4 fL (ref 80.0–100.0)
Platelets: 201 10*3/uL (ref 150–400)
RBC: 4.14 MIL/uL — ABNORMAL LOW (ref 4.22–5.81)
RDW: 14.4 % (ref 11.5–15.5)
WBC: 13.4 10*3/uL — ABNORMAL HIGH (ref 4.0–10.5)
nRBC: 0 % (ref 0.0–0.2)

## 2023-07-24 LAB — BASIC METABOLIC PANEL
Anion gap: 6 (ref 5–15)
BUN: 10 mg/dL (ref 8–23)
CO2: 27 mmol/L (ref 22–32)
Calcium: 8.3 mg/dL — ABNORMAL LOW (ref 8.9–10.3)
Chloride: 100 mmol/L (ref 98–111)
Creatinine, Ser: 1.17 mg/dL (ref 0.61–1.24)
GFR, Estimated: >60 mL/min (ref >60)
Glucose, Bld: 122 mg/dL — ABNORMAL HIGH (ref 70–99)
Potassium: 3.9 mmol/L (ref 3.5–5.1)
Sodium: 133 mmol/L — ABNORMAL LOW (ref 135–145)

## 2023-07-24 LAB — MAGNESIUM: Magnesium: 2.1 mg/dL (ref 1.7–2.4)

## 2023-07-24 MED ORDER — CHLORHEXIDINE GLUCONATE CLOTH 2 % EX PADS
6.0000 | MEDICATED_PAD | Freq: Every day | CUTANEOUS | Status: DC
  Administered 2023-07-23 – 2023-07-25 (×3): 6 via TOPICAL

## 2023-07-24 MED ORDER — METHOCARBAMOL 500 MG PO TABS
500.0000 mg | ORAL_TABLET | Freq: Four times a day (QID) | ORAL | Status: DC
  Administered 2023-07-24 – 2023-07-26 (×8): 500 mg via ORAL
  Filled 2023-07-24 (×8): qty 1

## 2023-07-24 MED ORDER — GABAPENTIN 300 MG PO CAPS
300.0000 mg | ORAL_CAPSULE | Freq: Two times a day (BID) | ORAL | Status: DC
  Administered 2023-07-24 – 2023-07-26 (×5): 300 mg via ORAL
  Filled 2023-07-24 (×5): qty 1

## 2023-07-24 NOTE — Progress Notes (Signed)
Desats to 87 on room air. Placed on 2l [place]. Pulse -92 % continue to monitor.

## 2023-07-24 NOTE — Progress Notes (Signed)
Mobility Specialist Progress Note:   07/24/23 0900  Mobility  Activity Stood at bedside  Level of Assistance Minimal assist, patient does 75% or more  Assistive Device Front wheel walker  Activity Response Tolerated fair  Mobility Referral Yes  $Mobility charge 1 Mobility  Mobility Specialist Start Time (ACUTE ONLY) [id number]  Mobility Specialist Stop Time (ACUTE ONLY) 0909  Mobility Specialist Time Calculation (min) (ACUTE ONLY) 14 min    Pt received on EOB, agreeable to mobility. Denied ambulation d/t dizziness and pain. Stood at bedside with minA to put on shorts and change bedding pad. C/o pain from chest tube throughout. Pt left on EOB with call bell and all needs met. Family present.  D'Vante Earlene Plater Mobility Specialist Please contact via Special educational needs teacher or Rehab office at [phone]

## 2023-07-24 NOTE — Progress Notes (Signed)
1 Day Post-Op Procedure(s) (LRB): XI ROBOTIC ASSISTED THORACOSCOPY-LEFT UPPER LOBE (Left) LYMPH NODE BIOPSY (Left) INTERCOSTAL NERVE BLOCK (Left) Subjective: Moderate pain  Objective: Vital signs in last 24 hours: Temp:  [97.6 F (36.4 C)-98.2 F (36.8 C)] 97.9 F (36.6 C) (11/01 0733) Pulse Rate:  [68-89] 83 (11/01 0733) Cardiac Rhythm: Normal sinus rhythm (11/01 0728) Resp:  [9-16] 13 (11/01 0733) BP: (89-126)/(67-107) 109/73 (11/01 0733) SpO2:  [90 %-98 %] 90 % (11/01 0733) Arterial Line BP: (77-105)/(46-59) 105/52 (10/31 1415)  Hemodynamic parameters for last 24 hours:    Intake/Output from previous day: 10/31 0701 - 11/01 0700 In: 1833.3 [P.O.:200; I.V.:1283.3; IV Piggyback:350] Out: 1675 [Urine:1565; Chest Tube:110] Intake/Output this shift: No intake/output data recorded.  General appearance: cooperative, fatigued, and no distress Heart: regular rate and rhythm Lungs: mildly dim in bases Abdomen: benign Extremities: no edema, no calf tenderness Wound: incis healing well  Lab Results: Recent Labs    07/21/23 1409  WBC 11.2*  HGB 14.8  HCT 44.9  PLT 225   BMET:  Recent Labs    07/21/23 1409  NA 138  K 3.9  CL 102  CO2 26  GLUCOSE 97  BUN 11  CREATININE 1.24  CALCIUM 9.3    PT/INR:  Recent Labs    07/21/23 1409  LABPROT 13.9  INR 1.0   ABG No results found for: "PHART", "HCO3", "TCO2", "ACIDBASEDEF", "O2SAT" CBG (last 3)  Recent Labs    07/21/23 1447 07/23/23 0557 07/23/23 1238  GLUCAP 98 103* 149*    Meds Scheduled Meds:  acetaminophen  1,000 mg Oral Q6H   Or   acetaminophen (TYLENOL) oral liquid 160 mg/5 mL  1,000 mg Oral Q6H   albuterol  2.5 mg Nebulization Q4H while awake   bisacodyl  10 mg Oral Daily   Chlorhexidine Gluconate Cloth  6 each Topical Daily   enoxaparin (LOVENOX) injection  40 mg Subcutaneous Daily   pantoprazole  40 mg Oral Daily   senna-docusate  1 tablet Oral QHS   Continuous Infusions:  dextrose 5 %  and 0.45 % NaCl 20 mL/hr at 07/24/23 0212   PRN Meds:.morphine injection, ondansetron (ZOFRAN) IV, oxyCODONE, traMADol  Xrays DG Chest Port 1 View  Result Date: 07/23/2023 CLINICAL DATA:  Status post lobectomy of lung. EXAM: PORTABLE CHEST 1 VIEW COMPARISON:  Chest radiographs 07/21/2023 and 12/16/2017; PET-CT 05/14/2023 FINDINGS: Note is made of a medial left upper lobe hypermetabolic solid nodule concerning for primary bronchogenic carcinoma on prior PET-CT 05/14/2023. Interval left lobectomy, presumably the left upper lobe. New left sided chest tube. Mild lateral left mid lung airspace density, possible atelectasis versus postsurgical change. Mild left-sided volume loss and mild leftward mediastinal shift. Surgical suture overlies the superior left hilum. Cardiac silhouette and mediastinal contours are within limits. Mild-to-moderate calcification within the aortic arch. The lungs are clear. No pleural effusion or pneumothorax. No acute skeletal abnormality. A screw again overlies the anterior left glenoid. IMPRESSION: Interval left lobectomy, presumably the left upper lobe given prior suspicious nodule on PET-CT. New left-sided chest tube. No pneumothorax. Electronically Signed   By: Neita Garnet M.D.   On: 07/23/2023 16:07    Assessment/Plan: S/P Procedure(s) (LRB): XI ROBOTIC ASSISTED THORACOSCOPY-LEFT UPPER LOBE (Left) LYMPH NODE BIOPSY (Left) INTERCOSTAL NERVE BLOCK (Left) POD#1  1 afeb, VSS, S BP 89-126 range, sinus rhythm, short run of vtach, PVC-s check labs 2 sats ok on RA 3 good UOP 4 CT 110 cc, no def air leak but very weak cough 5  CXR - Small apical left pntx/space 6 labs pending 7 will add gabapentin and robaxin to assist w/ pain management 8 routine pulm hygiene and rehab 9 lovenox for DVT  ppx     LOS: 1 day    Rowe Clack PA-C Pager [phone] 07/24/2023

## 2023-07-24 NOTE — Discharge Summary (Incomplete)
Physician Discharge Summary       [address]       [name] [address]             [phone]    Patient ID: [name] MRN: [id number] DOB/AGE: [birth year] 62 y.o.  Admit date: 07/23/2023 Discharge date: 07/27/2023  Admission Diagnoses: Lung nodule  Discharge Diagnoses:  Adenocarcinoma- left upper lobe, Clinical stage IA (T1,N0), Pathologic stage IIB (T3,N0) Principal Problem:   Lung nodule Active Problems:   S/P lobectomy of lung   Patient Active Problem List   Diagnosis Date Noted   Lung nodule 07/23/2023   S/P lobectomy of lung 07/23/2023   Alcoholic intoxication without complication (HCC)    Suicidal thoughts    MDD (major depressive disorder), recurrent episode, severe (HCC) 04/30/2017   HTN (hypertension) 04/01/2016   Homeless single person 04/01/2016   Tobacco abuse disorder 04/01/2016     Consults: None  Procedure (s): surgery  Operative Report    DATE OF PROCEDURE: 07/23/2023   PREOPERATIVE DIAGNOSIS:  Left upper lobe lung nodule.   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   PROCEDURE:  Xi robotic-assisted left upper lobe wedge resection, left upper lobectomy, lymph node dissection, and intercostal nerve blocks levels 3 through 10.   SURGEON:  Salvatore Decent. Dorris Fetch, MD   ASSISTANT:  Gershon Crane, PA-C   ANESTHESIA:  General.   Final pathology: Pending    Referring Provider is [name], DO     HPI at time of CT surgical consultation: [name] is a 62 year old man with a history of tobacco abuse, alcoholism, bipolar 1 disorder, schizoaffective schizophrenia, depression, type 2 diabetes without complication, gout and hyperlipidemia.  He recently saw a PA at [facility] for a refill of his Indocin which he takes for gout.  Because of his smoking history it was recommended that he have a low-dose CT for lung cancer screening.  That  showed an 8 mm left upper lobe lung nodule.  He was referred to Dr. Tonia Brooms.  PET/CT showed the nodule was hypermetabolic with an SUV of 5.  He now sent for consideration for surgical resection.   He continues to smoke about a pack a day.  Overall has about an 80-pack-year history (2 PPD x 40 years).  Currently lives with his significant other.  No chest pain, pressure, tightness, or shortness of breath.  Can walk up a flight of stairs.  Would be tired with 2 flights.  No change in appetite or weight loss.   He saw Dr. Marina Goodell and had colonoscopy which was ok.  Dr. Dorris Fetch evaluated the patient and studies and recommended proceeding with robotic assisted resection.  He was admitted this hospitalization for the procedure.  Hospital course: Patient was admitted electively on 07/23/2023 and taken the operating room at which time he underwent a left upper lobe wedge resection with completion lobectomy.  Initially he had lymph node dissection.  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  Patient tolerated procedure well was taken to the PACU in stable condition.  Postoperative hospital course:  The patient has done well.  Vitals have remained stable and he has remained afebrile.  Oxygen was weaned without difficulty and he maintained good saturations on room air.  He was voiding well and renal function has remained normal.  Chest tubes were removed on postoperative day #2.  He has a small left apical pneumothorax/space which has improved  over time.  He has a borderline expected acute blood loss anemia and values have remained stable.  Most recent hemoglobin hematocrit dated 07/25/2023 were 12.8/39.1 respectively.  Incisions are healing well without evidence of infection.  He is tolerating diet.  He tolerated gradually increasing activities using standard post surgical protocols.  Overall, at the time of discharge the patient was felt to be quite stable.     Physical Exam:  General appearance:  alert, cooperative, and no distress Heart: regular rate and rhythm Lungs: clear to auscultation bilaterally Abdomen: benign Extremities: no edema or calf tenderness Wound: incis healing well  Discharge Condition:good  Recent laboratory studies:  Lab Results  Component Value Date   WBC 11.1 (H) 07/25/2023   HGB 12.8 (L) 07/25/2023   HCT 39.1 07/25/2023   MCV 94.7 07/25/2023   PLT 217 07/25/2023   Lab Results  Component Value Date   NA 132 (L) 07/25/2023   K 3.9 07/25/2023   CL 98 07/25/2023   CO2 26 07/25/2023   CREATININE 1.20 07/25/2023   GLUCOSE 121 (H) 07/25/2023      Diagnostic Studies: DG Chest 2 View  Result Date: 07/26/2023 CLINICAL DATA:  Preoperative respiratory evaluation for left-sided lung nodule. EXAM: CHEST - 2 VIEW COMPARISON:  PET-CT 05/14/2023 FINDINGS: The lungs are clear without focal pneumonia, edema, pneumothorax or pleural effusion. Medial left upper lobe pulmonary nodule seen on PET-CT 05/14/2023 not readily evident by x-ray. The cardiopericardial silhouette is within normal limits for size. No acute bony abnormality. IMPRESSION: 1. No active cardiopulmonary disease. 2. Medial left upper lobe pulmonary nodule seen on PET-CT 05/14/2023 not readily evident by x-ray. Electronically Signed   By: Kennith Center M.D.   On: 07/26/2023 07:47   DG Chest 2 View  Result Date: 07/26/2023 CLINICAL DATA:  History of lobectomy.  Chest tube removal. EXAM: CHEST - 2 VIEW COMPARISON:  07/25/2023 FINDINGS: Interval removal of left-sided chest tube. Probable tiny left apical pneumothorax. Nodular density in the left mid lung is similar to prior. Right lung clear. The cardiopericardial silhouette is within normal limits for size. Telemetry leads overlie the chest. Subcutaneous emphysema again noted left chest wall. IMPRESSION: Interval removal of left-sided chest tube with probable tiny left apical pneumothorax. Electronically Signed   By: Kennith Center M.D.   On: 07/26/2023 07:45    DG Chest 1 View  Result Date: 07/25/2023 CLINICAL DATA:  Status post left lobectomy EXAM: CHEST  1 VIEW COMPARISON:  Chest x-ray dated July 24, 2023 FINDINGS: Cardiac and mediastinal contours are unchanged. Postsurgical changes of the left lung. Trace left apical pneumothorax, decreased in size. Left-sided chest tube in place. Similar opacities of the lower left hemithorax, likely due to atelectasis. No large effusion IMPRESSION: Trace left apical pneumothorax, decreased in size. Left-sided chest tube in place. Electronically Signed   By: Allegra Lai M.D.   On: 07/25/2023 11:41   DG Chest Port 1 View  Result Date: 07/24/2023 CLINICAL DATA:  Status post lobectomy. EXAM: PORTABLE CHEST 1 VIEW COMPARISON:  July 23, 2023. FINDINGS: Stable cardiomediastinal silhouette. Stable position of left-sided chest tube. Stable small left apical pneumothorax. Left basilar atelectasis is noted. IMPRESSION: Stable position of left-sided chest tube. Stable small left apical pneumothorax. Electronically Signed   By: Lupita Raider M.D.   On: 07/24/2023 09:09   DG Chest Port 1 View  Result Date: 07/23/2023 CLINICAL DATA:  Status post lobectomy of lung. EXAM: PORTABLE CHEST 1 VIEW COMPARISON:  Chest radiographs 07/21/2023 and 12/16/2017;  PET-CT 05/14/2023 FINDINGS: Note is made of a medial left upper lobe hypermetabolic solid nodule concerning for primary bronchogenic carcinoma on prior PET-CT 05/14/2023. Interval left lobectomy, presumably the left upper lobe. New left sided chest tube. Mild lateral left mid lung airspace density, possible atelectasis versus postsurgical change. Mild left-sided volume loss and mild leftward mediastinal shift. Surgical suture overlies the superior left hilum. Cardiac silhouette and mediastinal contours are within limits. Mild-to-moderate calcification within the aortic arch. The lungs are clear. No pleural effusion or pneumothorax. No acute skeletal abnormality. A screw again  overlies the anterior left glenoid. IMPRESSION: Interval left lobectomy, presumably the left upper lobe given prior suspicious nodule on PET-CT. New left-sided chest tube. No pneumothorax. Electronically Signed   By: Neita Garnet M.D.   On: 07/23/2023 16:07       Discharge Instructions     Discharge patient   Complete by: As directed    Discharge disposition: 01-Home or Self Care   Discharge patient date: 07/26/2023       Discharge Medications: Allergies as of 07/26/2023   No Known Allergies      Medication List     TAKE these medications    acetaminophen 500 MG tablet Commonly known as: TYLENOL Take 500-1,000 mg by mouth every 6 (six) hours as needed for moderate pain (pain score 4-6).   cetirizine 10 MG tablet Commonly known as: ZYRTEC Take 10 mg by mouth daily as needed for allergies.   gabapentin 300 MG capsule Commonly known as: NEURONTIN Take 1 capsule (300 mg total) by mouth 2 (two) times daily.   ibuprofen 200 MG tablet Commonly known as: ADVIL Take 400 mg by mouth every 6 (six) hours as needed for moderate pain (pain score 4-6).   indomethacin 50 MG capsule Commonly known as: INDOCIN Take 1 capsule (50 mg total) by mouth 3 (three) times daily with meals. What changed:  when to take this reasons to take this   methocarbamol 500 MG tablet Commonly known as: ROBAXIN Take 1 tablet (500 mg total) by mouth every 6 (six) hours as needed for muscle spasms.   oxyCODONE 5 MG immediate release tablet Commonly known as: Oxy IR/ROXICODONE Take 1 tablet (5 mg total) by mouth every 6 (six) hours as needed for up to 7 days for moderate pain (pain score 4-6).        Follow Up Appointments:  Follow-up Information     [name], MD Follow up.   Specialty: Cardiothoracic Surgery Why: Please see discharge paperwork for details of follow-up appointment with Dr. Dorris Fetch. Contact information: [address] [employer] [address] [place]  [address] [phone]         [facility] Follow up.   Why: On the date you are scheduled to see Dr. Dorris Fetch obtain a chest x-ray at [facility] 1 hour prior to that appointment. Contact information: [address] [place] [address]                Signed: Noel Christmas 07/27/2023, 8:21 AM

## 2023-07-24 NOTE — TOC Initial Note (Signed)
Transition of Care [facility]) - Initial/Assessment Note    Patient Details  Name: [name] MRN: [id number] Date of Birth: Jun 03, 1961  Transition of Care [facility]) CM/SW Contact:    Leone Haven, RN Phone Number: 07/24/2023, 3:32 PM  Clinical Narrative:                 From home with girlfriend, BJ,  she states he has  PCP and insurance on file, states has no HH services in place at this time or DME at home.  States she will transport him  home at [employer] and she is support system, states gets [facility] [place].  Pta self ambulatory .  Expected Discharge Plan: Home/Self Care Barriers to Discharge: Continued Medical Work up   Patient Goals and CMS Choice Patient states their goals for this hospitalization and ongoing recovery are:: return home   Choice offered to / list presented to : NA      Expected Discharge Plan and Services In-house Referral: NA Discharge Planning Services: CM Consult Post Acute Care Choice: NA Living arrangements for the past 2 months: Single Family Home                 DME Arranged: N/A DME Agency: NA       HH Arranged: NA          Prior Living Arrangements/Services Living arrangements for the past 2 months: Single Family Home Lives with:: Significant Other Patient language and need for interpreter reviewed:: Yes Do you feel safe going back to the place where you live?: Yes      Need for Family Participation in Patient Care: Yes (Comment) Care giver support system in place?: Yes (comment)   Criminal Activity/Legal Involvement Pertinent to Current Situation/Hospitalization: No - Comment as needed  Activities of Daily Living   ADL Screening (condition at time of admission) Independently performs ADLs?: Yes (appropriate for developmental age) Is the patient deaf or have difficulty hearing?: No Does the patient have difficulty seeing, even when wearing glasses/contacts?: No Does the patient have difficulty  concentrating, remembering, or making decisions?: No  Permission Sought/Granted Permission sought to share information with : Case Manager Permission granted to share information with : Yes, Verbal Permission Granted              Emotional Assessment       Orientation: : Oriented to Self, Oriented to Place, Oriented to  Time, Oriented to Situation   Psych Involvement: No (comment)  Admission diagnosis:  Lung nodule [R91.1] S/P lobectomy of lung [Z90.2] Patient Active Problem List   Diagnosis Date Noted   Lung nodule 07/23/2023   S/P lobectomy of lung 07/23/2023   Alcoholic intoxication without complication (HCC)    Suicidal thoughts    MDD (major depressive disorder), recurrent episode, severe (HCC) 04/30/2017   HTN (hypertension) 04/01/2016   Homeless single person 04/01/2016   Tobacco abuse disorder 04/01/2016   PCP:  [name], MD Pharmacy:   [phone] [address],  - [address] AT [facility] [address] [place] [address] Phone: [phone] Fax: [phone]     Social Determinants of Health (SDOH) Social History: SDOH Screenings   Food Insecurity: No Food Insecurity (07/23/2023)  Housing: Low Risk  (07/23/2023)  Transportation Needs: No Transportation Needs (07/23/2023)  Utilities: Not At Risk (07/23/2023)  Alcohol Screen: Low Risk  (07/30/2017)  Social Connections: Unknown (04/23/2023)   Received from [facility]  Tobacco Use:  High Risk (07/23/2023)   SDOH Interventions:     Readmission Risk Interventions     No data to display

## 2023-07-24 NOTE — Progress Notes (Signed)
In a constant pain on the surgical site, not ready to have the chest tube site  dressing to be changed.

## 2023-07-24 NOTE — Progress Notes (Signed)
Offered to have ambulation along the hallway but refused complaining  of feeling dizzy. Explained th significance of ambulation and claimed  " know  it". To try to ambulate  later today.

## 2023-07-25 ENCOUNTER — Inpatient Hospital Stay (HOSPITAL_COMMUNITY): Payer: MEDICAID

## 2023-07-25 LAB — CBC
HCT: 39.1 % (ref 39.0–52.0)
Hemoglobin: 12.8 g/dL — ABNORMAL LOW (ref 13.0–17.0)
MCH: 31 pg (ref 26.0–34.0)
MCHC: 32.7 g/dL (ref 30.0–36.0)
MCV: 94.7 fL (ref 80.0–100.0)
Platelets: 217 10*3/uL (ref 150–400)
RBC: 4.13 MIL/uL — ABNORMAL LOW (ref 4.22–5.81)
RDW: 14.2 % (ref 11.5–15.5)
WBC: 11.1 10*3/uL — ABNORMAL HIGH (ref 4.0–10.5)
nRBC: 0 % (ref 0.0–0.2)

## 2023-07-25 LAB — COMPREHENSIVE METABOLIC PANEL
ALT: 11 U/L (ref 0–44)
AST: 16 U/L (ref 15–41)
Albumin: 3.1 g/dL — ABNORMAL LOW (ref 3.5–5.0)
Alkaline Phosphatase: 63 U/L (ref 38–126)
Anion gap: 8 (ref 5–15)
BUN: 10 mg/dL (ref 8–23)
CO2: 26 mmol/L (ref 22–32)
Calcium: 8.6 mg/dL — ABNORMAL LOW (ref 8.9–10.3)
Chloride: 98 mmol/L (ref 98–111)
Creatinine, Ser: 1.2 mg/dL (ref 0.61–1.24)
GFR, Estimated: >60 mL/min (ref >60)
Glucose, Bld: 121 mg/dL — ABNORMAL HIGH (ref 70–99)
Potassium: 3.9 mmol/L (ref 3.5–5.1)
Sodium: 132 mmol/L — ABNORMAL LOW (ref 135–145)
Total Bilirubin: 1.1 mg/dL (ref 0.3–1.2)
Total Protein: 6.1 g/dL — ABNORMAL LOW (ref 6.5–8.1)

## 2023-07-25 MED ORDER — ALBUTEROL SULFATE (2.5 MG/3ML) 0.083% IN NEBU
2.5000 mg | INHALATION_SOLUTION | Freq: Four times a day (QID) | RESPIRATORY_TRACT | Status: DC
Start: 2023-07-25 — End: 2023-07-25
  Administered 2023-07-25: 2.5 mg via RESPIRATORY_TRACT
  Filled 2023-07-25: qty 3

## 2023-07-25 MED ORDER — ALBUTEROL SULFATE (2.5 MG/3ML) 0.083% IN NEBU
2.5000 mg | INHALATION_SOLUTION | Freq: Four times a day (QID) | RESPIRATORY_TRACT | Status: DC
  Administered 2023-07-25 – 2023-07-26 (×2): 2.5 mg via RESPIRATORY_TRACT
  Filled 2023-07-25 (×2): qty 3

## 2023-07-25 NOTE — Progress Notes (Signed)
2 Days Post-Op Procedure(s) (LRB): XI ROBOTIC ASSISTED THORACOSCOPY-LEFT UPPER LOBE (Left) LYMPH NODE BIOPSY (Left) INTERCOSTAL NERVE BLOCK (Left) Subjective: Pain controlled  Objective: Vital signs in last 24 hours: Temp:  [98 F (36.7 C)-98.5 F (36.9 C)] 98.5 F (36.9 C) (11/02 0736) Pulse Rate:  [87-101] 90 (11/02 0800) Cardiac Rhythm: Normal sinus rhythm (11/02 0700) Resp:  [12-19] 13 (11/02 0800) BP: (102-135)/(71-82) 102/74 (11/02 0736) SpO2:  [90 %-95 %] 95 % (11/02 0800)  Hemodynamic parameters for last 24 hours:    Intake/Output from previous day: 11/01 0701 - 11/02 0700 In: 400 [P.O.:400] Out: 710 [Urine:450; Chest Tube:260] Intake/Output this shift: No intake/output data recorded.  General appearance: alert, cooperative, and no distress Heart: regular rate and rhythm Lungs: dim left base Abdomen: benign Extremities: no edema or calf tenderness Wound: incis healing well  Lab Results: Recent Labs    07/24/23 1054 07/25/23 0706  WBC 13.4* 11.1*  HGB 12.4* 12.8*  HCT 39.1 39.1  PLT 201 217   BMET:  Recent Labs    07/24/23 1054 07/25/23 0706  NA 133* 132*  K 3.9 3.9  CL 100 98  CO2 27 26  GLUCOSE 122* 121*  BUN 10 10  CREATININE 1.17 1.20  CALCIUM 8.3* 8.6*    PT/INR: No results for input(s): "LABPROT", "INR" in the last 72 hours. ABG    Component Value Date/Time   PHART 7.303 (L) 07/23/2023 0846   HCO3 22.5 07/23/2023 0846   TCO2 24 07/23/2023 0846   ACIDBASEDEF 4.0 (H) 07/23/2023 0846   O2SAT 100 07/23/2023 0846   CBG (last 3)  Recent Labs    07/23/23 0557 07/23/23 1238  GLUCAP 103* 149*    Meds Scheduled Meds:  acetaminophen  1,000 mg Oral Q6H   Or   acetaminophen (TYLENOL) oral liquid 160 mg/5 mL  1,000 mg Oral Q6H   albuterol  2.5 mg Nebulization Q6H   bisacodyl  10 mg Oral Daily   Chlorhexidine Gluconate Cloth  6 each Topical Daily   enoxaparin (LOVENOX) injection  40 mg Subcutaneous Daily   gabapentin  300 mg Oral  BID   methocarbamol  500 mg Oral QID   pantoprazole  40 mg Oral Daily   senna-docusate  1 tablet Oral QHS   Continuous Infusions: PRN Meds:.morphine injection, ondansetron (ZOFRAN) IV, oxyCODONE  Xrays DG Chest Port 1 View  Result Date: 07/24/2023 CLINICAL DATA:  Status post lobectomy. EXAM: PORTABLE CHEST 1 VIEW COMPARISON:  July 23, 2023. FINDINGS: Stable cardiomediastinal silhouette. Stable position of left-sided chest tube. Stable small left apical pneumothorax. Left basilar atelectasis is noted. IMPRESSION: Stable position of left-sided chest tube. Stable small left apical pneumothorax. Electronically Signed   By: Lupita Raider M.D.   On: 07/24/2023 09:09   DG Chest Port 1 View  Result Date: 07/23/2023 CLINICAL DATA:  Status post lobectomy of lung. EXAM: PORTABLE CHEST 1 VIEW COMPARISON:  Chest radiographs 07/21/2023 and 12/16/2017; PET-CT 05/14/2023 FINDINGS: Note is made of a medial left upper lobe hypermetabolic solid nodule concerning for primary bronchogenic carcinoma on prior PET-CT 05/14/2023. Interval left lobectomy, presumably the left upper lobe. New left sided chest tube. Mild lateral left mid lung airspace density, possible atelectasis versus postsurgical change. Mild left-sided volume loss and mild leftward mediastinal shift. Surgical suture overlies the superior left hilum. Cardiac silhouette and mediastinal contours are within limits. Mild-to-moderate calcification within the aortic arch. The lungs are clear. No pleural effusion or pneumothorax. No acute skeletal abnormality. A screw again overlies  the anterior left glenoid. IMPRESSION: Interval left lobectomy, presumably the left upper lobe given prior suspicious nodule on PET-CT. New left-sided chest tube. No pneumothorax. Electronically Signed   By: Neita Garnet M.D.   On: 07/23/2023 16:07    Assessment/Plan: S/P Procedure(s) (LRB): XI ROBOTIC ASSISTED THORACOSCOPY-LEFT UPPER LOBE (Left) LYMPH NODE BIOPSY  (Left) INTERCOSTAL NERVE BLOCK (Left) POD# 2  1 afeb, VSS, sinus rhythm 2 O2 sats ok on 2 liters [place], push IS and pulm hygiene/rehab 3 CT 260 ml, no air leak, will remove tube today 4 fair UOP- not sure if all recorded 5 mild hyponatremia monitor, is off IVF 6 normal renal fxn 7 protein cal malnutrition by lab standards 8 minor leukocytosis- trending down 9 H/H very stable 10 CXR small left apical pntx/space, left basilar asd/atx 11 lovenox for DVT ppx 12 poss home in am  LOS: 2 days    Rowe Clack PA-C Pager [phone] 07/25/2023

## 2023-07-25 NOTE — Plan of Care (Signed)
  Problem: Clinical Measurements: Goal: Will remain free from infection Outcome: Progressing Goal: Respiratory complications will improve Outcome: Progressing Goal: Cardiovascular complication will be avoided Outcome: Progressing   Problem: Activity: Goal: Risk for activity intolerance will decrease Outcome: Progressing   Problem: Nutrition: Goal: Adequate nutrition will be maintained Outcome: Progressing   Problem: Coping: Goal: Level of anxiety will decrease Outcome: Progressing

## 2023-07-25 NOTE — Progress Notes (Signed)
Mobility Specialist Progress Note:   07/25/23 1100  Oxygen Therapy  O2 Device Nasal Cannula  O2 Flow Rate (L/min) 2 L/min  Mobility  Activity Ambulated with assistance in hallway  Level of Assistance Contact guard assist, steadying assist  Assistive Device Front wheel walker  Distance Ambulated (ft) 200 ft  Activity Response Tolerated well  Mobility Referral Yes  $Mobility charge 1 Mobility  Mobility Specialist Start Time (ACUTE ONLY) 1015  Mobility Specialist Stop Time (ACUTE ONLY) 1031  Mobility Specialist Time Calculation (min) (ACUTE ONLY) 16 min    Pre Mobility: 94 HR, 94% SpO2  Pt received in bed, agreeable to mobility. Able to stand and ambulate w/ contact guard today. Pt in much better mood d/t pain improvement. Asymptomatic w/ no complaints throughout. Pt left in bed with call bell and all needs met. Family present.  D'Vante Earlene Plater Mobility Specialist Please contact via Special educational needs teacher or Rehab office at [phone]

## 2023-07-25 NOTE — Progress Notes (Signed)
07/25/2023 6:28 AM Attempted to change CT dressing-pt wants to wait to see if CT will be pulled today and if not will let nursing change dressing. [name]

## 2023-07-26 ENCOUNTER — Inpatient Hospital Stay (HOSPITAL_COMMUNITY): Payer: MEDICAID

## 2023-07-26 MED ORDER — METHOCARBAMOL 500 MG PO TABS: 500.0000 mg | ORAL_TABLET | Freq: Four times a day (QID) | ORAL | 0 refills | Status: DC | PRN

## 2023-07-26 MED ORDER — GABAPENTIN 300 MG PO CAPS: 300.0000 mg | ORAL_CAPSULE | Freq: Two times a day (BID) | ORAL | 0 refills | Status: DC

## 2023-07-26 MED ORDER — ALBUTEROL SULFATE (2.5 MG/3ML) 0.083% IN NEBU
2.5000 mg | INHALATION_SOLUTION | Freq: Two times a day (BID) | RESPIRATORY_TRACT | Status: DC
Start: 2023-07-26 — End: 2023-07-26

## 2023-07-26 MED ORDER — OXYCODONE HCL 5 MG PO TABS: 5.0000 mg | ORAL_TABLET | Freq: Four times a day (QID) | ORAL | 0 refills | Status: AC | PRN

## 2023-07-26 NOTE — Progress Notes (Signed)
3 Days Post-Op Procedure(s) (LRB): XI ROBOTIC ASSISTED THORACOSCOPY-LEFT UPPER LOBE (Left) LYMPH NODE BIOPSY (Left) INTERCOSTAL NERVE BLOCK (Left) Subjective: Feels well  Objective: Vital signs in last 24 hours: Temp:  [97.6 F (36.4 C)-98.8 F (37.1 C)] 98.8 F (37.1 C) (11/03 0758) Pulse Rate:  [85-89] 85 (11/03 0332) Cardiac Rhythm: Normal sinus rhythm (11/03 0800) Resp:  [15-21] 15 (11/03 0332) BP: (69-115)/(52-83) 104/67 (11/03 0900) SpO2:  [91 %-97 %] 92 % (11/03 0332)  Hemodynamic parameters for last 24 hours:    Intake/Output from previous day: 11/02 0701 - 11/03 0700 In: -  Out: 300 [Urine:300] Intake/Output this shift: No intake/output data recorded.  General appearance: alert, cooperative, and no distress Heart: regular rate and rhythm Lungs: clear to auscultation bilaterally Abdomen: benign Extremities: no edema or calf tenderness Wound: incis healing well  Lab Results: Recent Labs    07/24/23 1054 07/25/23 0706  WBC 13.4* 11.1*  HGB 12.4* 12.8*  HCT 39.1 39.1  PLT 201 217   BMET:  Recent Labs    07/24/23 1054 07/25/23 0706  NA 133* 132*  K 3.9 3.9  CL 100 98  CO2 27 26  GLUCOSE 122* 121*  BUN 10 10  CREATININE 1.17 1.20  CALCIUM 8.3* 8.6*    PT/INR: No results for input(s): "LABPROT", "INR" in the last 72 hours. ABG    Component Value Date/Time   PHART 7.303 (L) 07/23/2023 0846   HCO3 22.5 07/23/2023 0846   TCO2 24 07/23/2023 0846   ACIDBASEDEF 4.0 (H) 07/23/2023 0846   O2SAT 100 07/23/2023 0846   CBG (last 3)  Recent Labs    07/23/23 1238  GLUCAP 149*    Meds Scheduled Meds:  acetaminophen  1,000 mg Oral Q6H   Or   acetaminophen (TYLENOL) oral liquid 160 mg/5 mL  1,000 mg Oral Q6H   albuterol  2.5 mg Nebulization Q6H WA   bisacodyl  10 mg Oral Daily   Chlorhexidine Gluconate Cloth  6 each Topical Daily   enoxaparin (LOVENOX) injection  40 mg Subcutaneous Daily   gabapentin  300 mg Oral BID   methocarbamol  500 mg  Oral QID   pantoprazole  40 mg Oral Daily   senna-docusate  1 tablet Oral QHS   Continuous Infusions: PRN Meds:.morphine injection, ondansetron (ZOFRAN) IV, oxyCODONE  Xrays DG Chest 2 View  Result Date: 07/26/2023 CLINICAL DATA:  History of lobectomy.  Chest tube removal. EXAM: CHEST - 2 VIEW COMPARISON:  07/25/2023 FINDINGS: Interval removal of left-sided chest tube. Probable tiny left apical pneumothorax. Nodular density in the left mid lung is similar to prior. Right lung clear. The cardiopericardial silhouette is within normal limits for size. Telemetry leads overlie the chest. Subcutaneous emphysema again noted left chest wall. IMPRESSION: Interval removal of left-sided chest tube with probable tiny left apical pneumothorax. Electronically Signed   By: Kennith Center M.D.   On: 07/26/2023 07:45   DG Chest 1 View  Result Date: 07/25/2023 CLINICAL DATA:  Status post left lobectomy EXAM: CHEST  1 VIEW COMPARISON:  Chest x-ray dated July 24, 2023 FINDINGS: Cardiac and mediastinal contours are unchanged. Postsurgical changes of the left lung. Trace left apical pneumothorax, decreased in size. Left-sided chest tube in place. Similar opacities of the lower left hemithorax, likely due to atelectasis. No large effusion IMPRESSION: Trace left apical pneumothorax, decreased in size. Left-sided chest tube in place. Electronically Signed   By: Allegra Lai M.D.   On: 07/25/2023 11:41    Assessment/Plan: S/P Procedure(s) (  LRB): XI ROBOTIC ASSISTED THORACOSCOPY-LEFT UPPER LOBE (Left) LYMPH NODE BIOPSY (Left) INTERCOSTAL NERVE BLOCK (Left) POD#3  1 afeb, S BP 100's-140's mostly , one systolic reading 41'L- doubt accurate 2 sats good on RA 3 voiding- not all measured 4 no new labs 5 CXR very stable appearance 6 final path pending 7 CXR stable appearance,tiny pntx 8 stable for d/c  LOS: 3 days    Rowe Clack PA-C Pager [phone] 07/26/2023

## 2023-07-26 NOTE — Progress Notes (Signed)
Mobility Specialist Progress Note:   07/26/23 0900  Therapy Vitals  BP 104/67  Oxygen Therapy  O2 Device Room Air  Mobility  Activity Ambulated with assistance in hallway  Level of Assistance Contact guard assist, steadying assist  Assistive Device None  Distance Ambulated (ft) 340 ft  Activity Response Tolerated well  Mobility Referral Yes  $Mobility charge 1 Mobility  Mobility Specialist Start Time (ACUTE ONLY) 0848  Mobility Specialist Stop Time (ACUTE ONLY) 0904  Mobility Specialist Time Calculation (min) (ACUTE ONLY) 16 min    Pre Mobility: 102 HR,  90% SpO2 During Mobility: 115 HR,  93% SpO2 Post Mobility:  112 HR,  93% SpO2  Received pt in bed having no complaints and agreeable to mobility. Pt was asymptomatic throughout ambulation and returned to room w/o fault. Left on EOB w/ call bell in reach and all needs met.   [name] Mobility Specialist Please contact via Special educational needs teacher or Rehab office at [phone]

## 2023-07-27 LAB — TYPE AND SCREEN
ABO/RH(D): B NEG
Antibody Screen: NEGATIVE
Unit division: 0
Unit division: 0

## 2023-07-27 LAB — BPAM RBC
Blood Product Expiration Date: 202411072359
Blood Product Expiration Date: 202411132359
ISSUE DATE / TIME: 202411010930
Unit Type and Rh: 1700
Unit Type and Rh: 202411072359

## 2023-07-27 LAB — SURGICAL PATHOLOGY

## 2023-07-27 NOTE — Anesthesia Postprocedure Evaluation (Signed)
Anesthesia Post Note  Patient: [name]  Procedure(s) Performed: XI ROBOTIC ASSISTED THORACOSCOPY-LEFT UPPER LOBE (Left: Chest) LYMPH NODE BIOPSY (Left: Chest) INTERCOSTAL NERVE BLOCK (Left: Chest)     Patient location during evaluation: PACU Anesthesia Type: General Level of consciousness: awake and alert Pain management: pain level controlled Vital Signs Assessment: post-procedure vital signs reviewed and stable Respiratory status: spontaneous breathing, nonlabored ventilation, respiratory function stable and patient connected to nasal cannula oxygen Cardiovascular status: blood pressure returned to baseline and stable Postop Assessment: no apparent nausea or vomiting Anesthetic complications: no   No notable events documented.  Last Vitals:  Vitals:   07/26/23 0900 07/26/23 1202  BP: 104/67 (!) 68/54  Pulse:  89  Resp:  17  Temp:  36.7 C  SpO2:  93%    Last Pain:  Vitals:   07/26/23 1202  TempSrc: Oral  PainSc:                  Nelle Don Roselynne Lortz

## 2023-07-29 ENCOUNTER — Telehealth: Payer: Self-pay | Admitting: *Deleted

## 2023-07-29 NOTE — Telephone Encounter (Signed)
Patient's friend Estella Husk contacted the office with post-op questions. Asked if patient should be on a blood thinner to prevent clot. Advised patient was not sent home on a blood thinner as there was no indication, advised to encourage frequent walking. Asked if patient can shower. Advised yes. Asked if neosporin can be applied to incisions. Advised no. Patient's suture removal scheduled for 11/8, moved to 11/11 to give full 10 days of suture placement. Per friend, patient is not using incentive spirometer as it was left at the hospital. Advised to encourage deep breathing exercises. Friend also states patient drank a beer yesterday and started smoking again. Advised to strongly encourage patient to not drink alcohol or smoke at this time. Per friend patient was not taking pain medication at time of beer consumption. Estella Husk verbalized understanding.

## 2023-07-30 ENCOUNTER — Other Ambulatory Visit: Payer: Self-pay | Admitting: Thoracic Surgery (Cardiothoracic Vascular Surgery)

## 2023-07-30 DIAGNOSIS — R911 Solitary pulmonary nodule: Secondary | ICD-10-CM

## 2023-08-03 ENCOUNTER — Ambulatory Visit: Payer: MEDICAID | Admitting: *Deleted

## 2023-08-03 DIAGNOSIS — Z4802 Encounter for removal of sutures: Secondary | ICD-10-CM

## 2023-08-03 NOTE — Progress Notes (Signed)
Patient arrived for nurse visit to remove sutures post-RATs LUL 10/31 with Dr. Dorris Fetch. Steri strips removed from chest tube insertion site. No suture present. Incisions approximated. Advised patient and family to keep area clean and dry. Advised to contact our office with any concerns. Patient has follow up appt scheduled for next week.

## 2023-08-06 ENCOUNTER — Other Ambulatory Visit: Payer: Self-pay

## 2023-08-07 NOTE — Progress Notes (Signed)
The proposed treatment discussed in conference is for discussion purpose only and is not a binding recommendation.  The patients have not been physically examined, or presented with their treatment options.  Therefore, final treatment plans cannot be decided.  

## 2023-08-10 ENCOUNTER — Ambulatory Visit
Admission: RE | Admit: 2023-08-10 | Discharge: 2023-08-10 | Disposition: A | Discharge status: Home / Self Care | Payer: MEDICAID | Source: Ambulatory Visit | Attending: Thoracic Surgery (Cardiothoracic Vascular Surgery) | Admitting: Thoracic Surgery (Cardiothoracic Vascular Surgery)

## 2023-08-10 ENCOUNTER — Other Ambulatory Visit: Payer: Self-pay | Admitting: Thoracic Surgery (Cardiothoracic Vascular Surgery)

## 2023-08-10 ENCOUNTER — Encounter: Payer: Self-pay | Admitting: Thoracic Surgery (Cardiothoracic Vascular Surgery)

## 2023-08-10 ENCOUNTER — Ambulatory Visit (INDEPENDENT_AMBULATORY_CARE_PROVIDER_SITE_OTHER): Payer: MEDICAID | Admitting: Thoracic Surgery (Cardiothoracic Vascular Surgery)

## 2023-08-10 VITALS — BP 89/67 | HR 90 | Resp 18 | Ht 72.0 in | Wt 177.0 lb

## 2023-08-10 DIAGNOSIS — Z09 Encounter for follow-up examination after completed treatment for conditions other than malignant neoplasm: Secondary | ICD-10-CM | POA: Diagnosis not present

## 2023-08-10 DIAGNOSIS — R911 Solitary pulmonary nodule: Secondary | ICD-10-CM

## 2023-08-10 NOTE — Progress Notes (Signed)
[address]       [name] [address]             [phone]     HPI: [name] returns for follow-up after recent left upper lobectomy.  [name] is a 62 year old man with a history of tobacco abuse, alcoholism, bipolar 1 disorder, schizoaffective schizophrenia, depression, type 2 diabetes, gout, hyperlipidemia, and a stage IIb adenocarcinoma of the lung.  He was found to have an 8 mm left upper lobe lung nodule on a low-dose CT for lung cancer screening.  Dr. Tonia Scott did a PET/CT which showed it was hypermetabolic with an SUV of 5.  I did a robotic assisted left upper lobectomy.  The nodule was invading the mediastinal pleura superior to the aorta and portion of the mediastinal fat was taken en bloc with the tumor.  The margin was negative.  Final pathology was T3, N0, stage IIb adenocarcinoma.  He did well postoperatively and went home on day 3.  He had a fair amount of pain initially and has used all of his oxycodone and Robaxin.  He does not want those medications refilled.  Currently taking Tylenol and ibuprofen.  Past Medical History:  Diagnosis Date   Alcoholic (HCC)    Bipolar 1 disorder (HCC)    Depression    Diabetes mellitus without complication (HCC)    Per pt he is pre-diabetic   Homeless    Schizoaffective schizophrenia (HCC)     Current Outpatient Medications  Medication Sig Dispense Refill   acetaminophen (TYLENOL) 500 MG tablet Take 500-1,000 mg by mouth every 6 (six) hours as needed for moderate pain (pain score 4-6).     cetirizine (ZYRTEC) 10 MG tablet Take 10 mg by mouth daily as needed for allergies.     gabapentin (NEURONTIN) 300 MG capsule Take 1 capsule (300 mg total) by mouth 2 (two) times daily. 60 capsule 0   ibuprofen (ADVIL) 200 MG tablet Take 400 mg by mouth every 6 (six) hours as needed for moderate pain (pain score 4-6).     indomethacin (INDOCIN) 50 MG capsule Take 1 capsule (50 mg total) by mouth 3 (three)  times daily with meals. (Patient taking differently: Take 50 mg by mouth 3 (three) times daily as needed (gout).) 50 capsule 2   methocarbamol (ROBAXIN) 500 MG tablet Take 1 tablet (500 mg total) by mouth every 6 (six) hours as needed for muscle spasms. 28 tablet 0   No current facility-administered medications for this visit.    Physical Exam BP (!) 89/67 (BP Location: Left Arm, Patient Position: Sitting)   Pulse 90   Resp 18   Ht 6' (1.829 m)   Wt 177 lb (80.3 kg)   SpO2 95% Comment: RA  BMI 24.48 kg/m  62 year old man in no acute distress Alert and oriented x 3 with no focal deficits Lungs diminished at left base but otherwise clear Incisions clean dry and intact Cardiac regular rate and rhythm No peripheral edema  Diagnostic Tests: I personally reviewed his chest x-ray images.  Postoperative changes from left upper lobectomy.  Impression: [name] is a 62 year old man with a history of tobacco abuse, alcoholism, bipolar 1 disorder, schizoaffective schizophrenia, depression, type 2 diabetes, gout, hyperlipidemia, and a stage IIb adenocarcinoma of the lung.  Stage IIb adenocarcinoma of the lung (T3, N0)-status post left upper lobectomy.  Will refer to [name] for oncology consultation.  Molecular testing and PD-L1 testing to assess for  possible targeted or immunotherapy.  Status post left upper lobectomy-doing well.  He does have some pain but is not requiring any narcotics at this point.  May gradually resume activities.  Can drive on a limited basis but cautioned him not to drive if he is hurting significantly.   Plan: MR brain to rule out mets and complete clinical staging Referral to [name] for oncology consultation Return in 1 month with PA and lateral chest x-ray to check on progress.  Troy Slot, MD [facility] [phone]

## 2023-08-12 ENCOUNTER — Other Ambulatory Visit: Payer: Self-pay

## 2023-08-12 DIAGNOSIS — Z902 Acquired absence of lung [part of]: Secondary | ICD-10-CM

## 2023-08-12 NOTE — Progress Notes (Signed)
I made introductory phone call to pt to see if he would be able to come to an appt with Dr Arbutus Ped tomorrow at 1:30 and a lab appt at 1:00. Pt agreed to date and times of the apptments. Pt aware of our location. All questions answered.

## 2023-08-13 ENCOUNTER — Ambulatory Visit
Admission: RE | Admit: 2023-08-13 | Discharge: 2023-08-13 | Disposition: A | Discharge status: Home / Self Care | Payer: MEDICAID | Source: Ambulatory Visit | Attending: Thoracic Surgery (Cardiothoracic Vascular Surgery) | Admitting: Thoracic Surgery (Cardiothoracic Vascular Surgery)

## 2023-08-13 ENCOUNTER — Inpatient Hospital Stay (HOSPITAL_BASED_OUTPATIENT_CLINIC_OR_DEPARTMENT_OTHER): Payer: MEDICAID | Admitting: Internal Medicine

## 2023-08-13 ENCOUNTER — Other Ambulatory Visit: Payer: Self-pay

## 2023-08-13 ENCOUNTER — Inpatient Hospital Stay: Payer: MEDICAID | Attending: Internal Medicine

## 2023-08-13 VITALS — BP 114/79 | HR 114 | Temp 98.3°F | Resp 17 | Ht 72.0 in | Wt 172.3 lb

## 2023-08-13 DIAGNOSIS — R911 Solitary pulmonary nodule: Secondary | ICD-10-CM

## 2023-08-13 DIAGNOSIS — Z902 Acquired absence of lung [part of]: Secondary | ICD-10-CM

## 2023-08-13 DIAGNOSIS — C3412 Malignant neoplasm of upper lobe, left bronchus or lung: Secondary | ICD-10-CM | POA: Diagnosis not present

## 2023-08-13 LAB — CMP (CANCER CENTER ONLY)
ALT: 9 U/L (ref 0–44)
AST: 14 U/L — ABNORMAL LOW (ref 15–41)
Albumin: 4.4 g/dL (ref 3.5–5.0)
Alkaline Phosphatase: 96 U/L (ref 38–126)
Anion gap: 10 (ref 5–15)
BUN: 11 mg/dL (ref 8–23)
CO2: 28 mmol/L (ref 22–32)
Calcium: 9.9 mg/dL (ref 8.9–10.3)
Chloride: 100 mmol/L (ref 98–111)
Creatinine: 1.09 mg/dL (ref 0.61–1.24)
GFR, Estimated: >60 mL/min (ref >60)
Glucose, Bld: 97 mg/dL (ref 70–99)
Potassium: 4 mmol/L (ref 3.5–5.1)
Sodium: 138 mmol/L (ref 135–145)
Total Bilirubin: 0.5 mg/dL (ref <1.2)
Total Protein: 7.9 g/dL (ref 6.5–8.1)

## 2023-08-13 LAB — CBC WITH DIFFERENTIAL (CANCER CENTER ONLY)
Abs Immature Granulocytes: 0.05 10*3/uL (ref 0.00–0.07)
Basophils Absolute: 0.1 10*3/uL (ref 0.0–0.1)
Basophils Relative: 1 %
Eosinophils Absolute: 1.1 10*3/uL — ABNORMAL HIGH (ref 0.0–0.5)
Eosinophils Relative: 10 %
HCT: 45 % (ref 39.0–52.0)
Hemoglobin: 14.8 g/dL (ref 13.0–17.0)
Immature Granulocytes: 0 %
Lymphocytes Relative: 22 %
Lymphs Abs: 2.6 10*3/uL (ref 0.7–4.0)
MCH: 30.1 pg (ref 26.0–34.0)
MCHC: 32.9 g/dL (ref 30.0–36.0)
MCV: 91.6 fL (ref 80.0–100.0)
Monocytes Absolute: 1 10*3/uL (ref 0.1–1.0)
Monocytes Relative: 8 %
Neutro Abs: 7.2 10*3/uL (ref 1.7–7.7)
Neutrophils Relative %: 59 %
Platelet Count: 401 10*3/uL — ABNORMAL HIGH (ref 150–400)
RBC: 4.91 MIL/uL (ref 4.22–5.81)
RDW: 13.5 % (ref 11.5–15.5)
WBC Count: 12 10*3/uL — ABNORMAL HIGH (ref 4.0–10.5)
nRBC: 0 % (ref 0.0–0.2)

## 2023-08-13 NOTE — Progress Notes (Signed)
I met with the pt face to face today at his med onc consult with Dr. Arbutus Scott. He was accompanied by his friend, B.J. The recommendation for the pt, because he has stage IIB dz, is to have 4 rounds of adjuvant chemotherapy, which if the pt decides to do treatment will start in early January 2025. Pt has indicated to me that he plans to get chemotherapy. I provided my business card to the pt and his friend B.J[employer] [facility] Financial Assistance Application filled out by the pt and was faxed to [facility]. All questions answered.

## 2023-08-13 NOTE — Progress Notes (Signed)
[name] [phone]   [phone]  CONSULT NOTE  REFERRING PHYSICIAN: Dr. Charlett Lango  REASON FOR CONSULTATION:  62 years old white male recently diagnosed with lung cancer  HPI SHELDAN Scott is a 62 y.o. male initial evaluation for recently diagnosed lung cancer accompanied by his friend BJ. Discussed the use of AI scribe software for clinical note transcription with the patient, who gave verbal consent to proceed.  History of Present Illness   The patient, a 62 year old with a recent diagnosis of non-small cell lung adenocarcinoma, presents for a consultation following surgical resection of the tumor. The patient's journey began with a lung screening due to a history of smoking, which revealed a 0.8 cm nodule in the left upper lobe of the lung. A subsequent PET scan showed the nodule had grown to 1.3 cm, leading to a referral to a surgeon who performed a left upper lobectomy in October 2024.  Post-surgery, the patient reports no chest pain, breathing issues, cough, or hemoptysis. There are no symptoms of nausea, vomiting, diarrhea, headaches, or vision changes. The patient denies significant weight loss, noting a 5-pound difference on different scales but no change in belt size.  The patient has a history of bipolar disorder, schizophrenia, depression, and alcohol abuse, all of which are reportedly well-managed and in the distant past. The patient denies having diabetes, hypertension, or a history of heart attacks or strokes. The patient continues to smoke, currently about a pack a day, and has reduced alcohol consumption significantly.  The patient reports post-surgical discomfort in the left chest area, describing it as soreness where the surgical tubes were inserted. The patient also mentions a lingering bruise from arterial blood gas sampling.  The patient's family history is somewhat unclear, with the patient's mother having  passed away from an unspecified condition and the biological father's cause of death unknown. The patient is not currently married and was previously employed in maintenance.       Past Medical History:  Diagnosis Date   Alcoholic (HCC)    Bipolar 1 disorder (HCC)    Depression    Diabetes mellitus without complication (HCC)    Per pt he is pre-diabetic   Homeless    Schizoaffective schizophrenia [facility])     Past Surgical History:  Procedure Laterality Date   COLONOSCOPY  07/16/2023   DENTAL SURGERY     wisdom teeth   INTERCOSTAL NERVE BLOCK Left 07/23/2023   Procedure: INTERCOSTAL NERVE BLOCK;  Surgeon: [name], MD;  Location: [facility] OR;  Service: Thoracic;  Laterality: Left;   LYMPH NODE BIOPSY Left 07/23/2023   Procedure: LYMPH NODE BIOPSY;  Surgeon: [name], MD;  Location: [facility] OR;  Service: Thoracic;  Laterality: Left;    Family History  Problem Relation Age of Onset   Colon cancer Neg Hx    Rectal cancer Neg Hx    Stomach cancer Neg Hx     Social History Social History   Tobacco Use   Smoking status: Every Day    Current packs/day: 1.00    Average packs/day: 1 pack/day for 46.9 years (46.9 ttl pk-yrs)    Types: Cigarettes    Start date: 1978   Smokeless tobacco: Never   Tobacco comments:    7 packs of cigarettes in a week. 05/13/23 Tay  Vaping Use   Vaping status: Never Used  Substance Use Topics   Alcohol use: Yes   Drug use: No    No  Known Allergies  Current Outpatient Medications  Medication Sig Dispense Refill   acetaminophen (TYLENOL) 500 MG tablet Take 500-1,000 mg by mouth every 6 (six) hours as needed for moderate pain (pain score 4-6).     cetirizine (ZYRTEC) 10 MG tablet Take 10 mg by mouth daily as needed for allergies.     gabapentin (NEURONTIN) 300 MG capsule Take 1 capsule (300 mg total) by mouth 2 (two) times daily. 60 capsule 0   ibuprofen (ADVIL) 200 MG tablet Take 400 mg by mouth every 6 (six) hours as needed for  moderate pain (pain score 4-6).     indomethacin (INDOCIN) 50 MG capsule Take 1 capsule (50 mg total) by mouth 3 (three) times daily with meals. (Patient taking differently: Take 50 mg by mouth 3 (three) times daily as needed (gout).) 50 capsule 2   methocarbamol (ROBAXIN) 500 MG tablet Take 1 tablet (500 mg total) by mouth every 6 (six) hours as needed for muscle spasms. 28 tablet 0   No current facility-administered medications for this visit.    Review of Systems  Constitutional: negative Eyes: negative Ears, nose, mouth, throat, and face: negative Respiratory: positive for pleurisy/chest pain Cardiovascular: negative Gastrointestinal: negative Genitourinary:negative Integument/breast: negative Hematologic/lymphatic: negative Musculoskeletal:negative Neurological: negative Behavioral/Psych: negative Endocrine: negative Allergic/Immunologic: negative  Physical Exam  WUJ:WJXBJ, healthy, no distress, well nourished, well developed, and anxious SKIN: skin color, texture, turgor are normal, no rashes or significant lesions HEAD: Normocephalic, No masses, lesions, tenderness or abnormalities EYES: normal, PERRLA, Conjunctiva are pink and non-injected EARS: External ears normal, Canals clear OROPHARYNX:no exudate, no erythema, and lips, buccal mucosa, and tongue normal  NECK: supple, no adenopathy, no JVD LYMPH:  no palpable lymphadenopathy, no hepatosplenomegaly LUNGS: clear to auscultation , and palpation HEART: regular rate & rhythm, no murmurs, and no gallops ABDOMEN:abdomen soft, non-tender, normal bowel sounds, and no masses or organomegaly BACK: Back symmetric, no curvature., No CVA tenderness EXTREMITIES:no joint deformities, effusion, or inflammation, no edema  NEURO: alert & oriented x 3 with fluent speech, no focal motor/sensory deficits  PERFORMANCE STATUS: ECOG 1  LABORATORY DATA: Lab Results  Component Value Date   WBC 12.0 (H) 08/13/2023   HGB 14.8 08/13/2023    HCT 45.0 08/13/2023   MCV 91.6 08/13/2023   PLT 401 (H) 08/13/2023      Chemistry      Component Value Date/Time   NA 138 08/13/2023 1304   K 4.0 08/13/2023 1304   CL 100 08/13/2023 1304   CO2 28 08/13/2023 1304   BUN 11 08/13/2023 1304   CREATININE 1.09 08/13/2023 1304   CREATININE 1.09 03/24/2023 0000      Component Value Date/Time   CALCIUM 9.9 08/13/2023 1304   ALKPHOS 96 08/13/2023 1304   AST 14 (L) 08/13/2023 1304   ALT 9 08/13/2023 1304   BILITOT 0.5 08/13/2023 1304       RADIOGRAPHIC STUDIES: DG Chest 2 View  Result Date: 08/10/2023 CLINICAL DATA:  Follow-up left pneumothorax following left lobectomy. EXAM: CHEST - 2 VIEW COMPARISON:  07/26/2023 FINDINGS: Mild increase in size of the previously demonstrated small left apical pneumothorax with a mild increase in size of a partially loculated basilar hydropneumothorax component. The estimated volume of the combined left hydropneumothorax is approximately 15% of the left hemithorax. A nodular density in the left mid lung zone is better defined and circumscribed. Clear right lung. Stable right apical pleural thickening. Minimal thoracic spine degenerative changes. Left shoulder fixation screw. IMPRESSION: 1. Mild increase in size  of the previously demonstrated small left apical pneumothorax with a mild increase in size of a partially loculated basilar hydropneumothorax component. The estimated volume of the combined left hydropneumothorax is approximately 15% of the left hemithorax. 2. Nodular density in the left mid lung zone is better defined and circumscribed. These results will be called to the ordering clinician or representative by the Radiologist Assistant, and communication documented in the PACS or [employer]. Electronically Signed   By: Beckie Salts M.D.   On: 08/10/2023 18:23   DG Chest 2 View  Result Date: 07/26/2023 CLINICAL DATA:  Preoperative respiratory evaluation for left-sided lung nodule. EXAM: CHEST  - 2 VIEW COMPARISON:  PET-CT 05/14/2023 FINDINGS: The lungs are clear without focal pneumonia, edema, pneumothorax or pleural effusion. Medial left upper lobe pulmonary nodule seen on PET-CT 05/14/2023 not readily evident by x-ray. The cardiopericardial silhouette is within normal limits for size. No acute bony abnormality. IMPRESSION: 1. No active cardiopulmonary disease. 2. Medial left upper lobe pulmonary nodule seen on PET-CT 05/14/2023 not readily evident by x-ray. Electronically Signed   By: Kennith Scott M.D.   On: 07/26/2023 07:47   DG Chest 2 View  Result Date: 07/26/2023 CLINICAL DATA:  History of lobectomy.  Chest tube removal. EXAM: CHEST - 2 VIEW COMPARISON:  07/25/2023 FINDINGS: Interval removal of left-sided chest tube. Probable tiny left apical pneumothorax. Nodular density in the left mid lung is similar to prior. Right lung clear. The cardiopericardial silhouette is within normal limits for size. Telemetry leads overlie the chest. Subcutaneous emphysema again noted left chest wall. IMPRESSION: Interval removal of left-sided chest tube with probable tiny left apical pneumothorax. Electronically Signed   By: Kennith Scott M.D.   On: 07/26/2023 07:45   DG Chest 1 View  Result Date: 07/25/2023 CLINICAL DATA:  Status post left lobectomy EXAM: CHEST  1 VIEW COMPARISON:  Chest x-ray dated July 24, 2023 FINDINGS: Cardiac and mediastinal contours are unchanged. Postsurgical changes of the left lung. Trace left apical pneumothorax, decreased in size. Left-sided chest tube in place. Similar opacities of the lower left hemithorax, likely due to atelectasis. No large effusion IMPRESSION: Trace left apical pneumothorax, decreased in size. Left-sided chest tube in place. Electronically Signed   By: Allegra Lai M.D.   On: 07/25/2023 11:41   DG Chest Port 1 View  Result Date: 07/24/2023 CLINICAL DATA:  Status post lobectomy. EXAM: PORTABLE CHEST 1 VIEW COMPARISON:  July 23, 2023. FINDINGS:  Stable cardiomediastinal silhouette. Stable position of left-sided chest tube. Stable small left apical pneumothorax. Left basilar atelectasis is noted. IMPRESSION: Stable position of left-sided chest tube. Stable small left apical pneumothorax. Electronically Signed   By: Lupita Raider M.D.   On: 07/24/2023 09:09   DG Chest Port 1 View  Result Date: 07/23/2023 CLINICAL DATA:  Status post lobectomy of lung. EXAM: PORTABLE CHEST 1 VIEW COMPARISON:  Chest radiographs 07/21/2023 and 12/16/2017; PET-CT 05/14/2023 FINDINGS: Note is made of a medial left upper lobe hypermetabolic solid nodule concerning for primary bronchogenic carcinoma on prior PET-CT 05/14/2023. Interval left lobectomy, presumably the left upper lobe. New left sided chest tube. Mild lateral left mid lung airspace density, possible atelectasis versus postsurgical change. Mild left-sided volume loss and mild leftward mediastinal shift. Surgical suture overlies the superior left hilum. Cardiac silhouette and mediastinal contours are within limits. Mild-to-moderate calcification within the aortic arch. The lungs are clear. No pleural effusion or pneumothorax. No acute skeletal abnormality. A screw again overlies the anterior left glenoid. IMPRESSION:  Interval left lobectomy, presumably the left upper lobe given prior suspicious nodule on PET-CT. New left-sided chest tube. No pneumothorax. Electronically Signed   By: Neita Garnet M.D.   On: 07/23/2023 16:07    ASSESSMENT AND PLAN:    Non-Small Cell Lung Cancer (NSCLC) - Stage 2B (T3, N0, M0) Diagnosed with adenocarcinoma in the left upper lobe. Initial CT on April 24, 2023, showed a 0.8 cm nodule, which grew to 1.3 cm by PET scan. Underwent left upper lobectomy on July 23, 2023. Pathology confirmed adenocarcinoma with parietal pleura invasion, stage 2B (T3, N0, M0). No lymph node involvement. Discussed four cycles of chemotherapy to reduce recurrence risk and improve five-year survival from  50% to 60%. Side effects include nausea, vomiting, hair loss, and potential kidney damage. Circulating tumor DNA testing discussed but noted as costly and in early phases. Patient expressed concerns about low survival benefit and side effects. - Administer four cycles of cisplatin-based chemotherapy starting January 2025 - Arrange B12 shot one week before chemotherapy - Schedule chemotherapy education class - Prescribe daily folic acid - Prescribe steroids for the day before, the day of, and the day after chemotherapy - Provide anti-nausea medication - Send tumor biopsy for marker testing for targeted therapy eligibility - If no chemotherapy, schedule follow-up in four months with chest scan  Post-Surgical Pain Soreness and nerve pain in the left side and back of the arm, likely due to lobectomy and chest tube placement. Bruising from arterial blood gas sampling noted. - Provide supportive care and pain management  General Health Maintenance 45-year smoking history, currently smoking one pack per day. No diabetes, hypertension, heart attacks, or strokes. History of bipolar disorder, schizophrenia, depression, and alcohol abuse, currently inactive. Reduced alcohol consumption noted. - Encourage smoking cessation - Monitor for mental health relapse - Continue to monitor alcohol consumption and encourage moderation  Follow-up - If chemotherapy is chosen, start in January 2025 - If no chemotherapy, schedule follow-up in four months with chest scan.   The patient was advised to call immediately if he has any other concerning symptoms in the interval. The patient voices understanding of current disease status and treatment options and is in agreement with the current care plan.  All questions were answered. The patient knows to call the clinic with any problems, questions or concerns. We can certainly see the patient much sooner if necessary.  Thank you so much for allowing me to participate in  the care of [name]. I will continue to follow up the patient with you and assist in his care. The total time spent in the appointment was 90 minutes.  Disclaimer: This note was dictated with voice recognition software. Similar sounding words can inadvertently be transcribed and may not be corrected upon review.   Lajuana Matte August 13, 2023, 1:43 PM

## 2023-08-18 ENCOUNTER — Telehealth: Payer: Self-pay | Admitting: *Deleted
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
## 2023-08-19 ENCOUNTER — Other Ambulatory Visit: Payer: Self-pay | Admitting: Thoracic Surgery (Cardiothoracic Vascular Surgery)

## 2023-08-19 DIAGNOSIS — R911 Solitary pulmonary nodule: Secondary | ICD-10-CM

## 2023-08-24 ENCOUNTER — Ambulatory Visit
Admission: RE | Admit: 2023-08-24 | Discharge: 2023-08-24 | Disposition: A | Discharge status: Home / Self Care | Payer: MEDICAID | Source: Ambulatory Visit | Attending: Thoracic Surgery (Cardiothoracic Vascular Surgery) | Admitting: Thoracic Surgery (Cardiothoracic Vascular Surgery)

## 2023-08-24 ENCOUNTER — Encounter (HOSPITAL_COMMUNITY): Payer: Self-pay

## 2023-08-24 DIAGNOSIS — R911 Solitary pulmonary nodule: Secondary | ICD-10-CM

## 2023-08-24 MED ORDER — IOPAMIDOL (ISOVUE-370) INJECTION 76%
200.0000 mL | Freq: Once | INTRAVENOUS | Status: AC | PRN
  Administered 2023-08-24: 60 mL via INTRAVENOUS

## 2023-08-27 ENCOUNTER — Encounter (HOSPITAL_COMMUNITY): Payer: Self-pay

## 2023-09-07 ENCOUNTER — Other Ambulatory Visit: Payer: Self-pay | Admitting: Thoracic Surgery (Cardiothoracic Vascular Surgery)

## 2023-09-07 DIAGNOSIS — R911 Solitary pulmonary nodule: Secondary | ICD-10-CM

## 2023-09-08 ENCOUNTER — Ambulatory Visit
Admission: RE | Admit: 2023-09-08 | Discharge: 2023-09-08 | Disposition: A | Discharge status: Home / Self Care | Payer: MEDICAID | Source: Ambulatory Visit | Attending: Thoracic Surgery (Cardiothoracic Vascular Surgery) | Admitting: Thoracic Surgery (Cardiothoracic Vascular Surgery)

## 2023-09-08 ENCOUNTER — Ambulatory Visit (INDEPENDENT_AMBULATORY_CARE_PROVIDER_SITE_OTHER): Payer: MEDICAID | Admitting: Thoracic Surgery (Cardiothoracic Vascular Surgery)

## 2023-09-08 VITALS — BP 117/75 | HR 108 | Resp 20 | Ht 72.0 in | Wt 170.0 lb

## 2023-09-08 DIAGNOSIS — Z09 Encounter for follow-up examination after completed treatment for conditions other than malignant neoplasm: Secondary | ICD-10-CM

## 2023-09-08 DIAGNOSIS — R911 Solitary pulmonary nodule: Secondary | ICD-10-CM

## 2023-09-08 NOTE — Progress Notes (Signed)
[address]       [name] [address]             [phone]     HPI: [name] returns for a scheduled follow-up visit after left upper lobectomy.  Shanden Paar is a 62 year old male with a history of tobacco abuse, alcoholism, bipolar disorder, schizoaffective schizophrenia, depression, type 2 diabetes, gout, hyperlipidemia, and a stage IIb adenocarcinoma of the lung.  He was found to have an 8 mm left upper lobe lung nodule on a low-dose CT for lung cancer screening.  PET/CT showed was hypermetabolic with an SUV of 5.  I did a robotic assisted left upper lobectomy on 07/23/2023.  At the time of surgery was noted that the tumor was invading the mediastinal pleura superior to the aorta.  A margin of fatty tissue was taken en bloc with the tumor and the final margin was negative.  Final pathology showed a T3, N0, stage IIb adenocarcinoma.  I saw him in the office on 08/10/2023.  He was doing well at that time.  Was having some pain but not taking any narcotics.  In the interim since his last visit he saw Dr. Arbutus Ped.  He did recommend adjuvant chemotherapy.  He feels well.  He does have some soreness but is not taking any medication for pain.  No respiratory issues.  Past Medical History:  Diagnosis Date   Alcoholic (HCC)    Bipolar 1 disorder (HCC)    Depression    Diabetes mellitus without complication (HCC)    Per pt he is pre-diabetic   Homeless    Schizoaffective schizophrenia (HCC)     Current Outpatient Medications  Medication Sig Dispense Refill   acetaminophen (TYLENOL) 500 MG tablet Take 500-1,000 mg by mouth every 6 (six) hours as needed for moderate pain (pain score 4-6).     cetirizine (ZYRTEC) 10 MG tablet Take 10 mg by mouth daily as needed for allergies.     ibuprofen (ADVIL) 200 MG tablet Take 400 mg by mouth every 6 (six) hours as needed for moderate pain (pain score 4-6).     indomethacin (INDOCIN) 50 MG capsule Take 1 capsule  (50 mg total) by mouth 3 (three) times daily with meals. (Patient taking differently: Take 50 mg by mouth 3 (three) times daily as needed (gout).) 50 capsule 2   gabapentin (NEURONTIN) 300 MG capsule Take 1 capsule (300 mg total) by mouth 2 (two) times daily. (Patient not taking: Reported on 09/08/2023) 60 capsule 0   methocarbamol (ROBAXIN) 500 MG tablet Take 1 tablet (500 mg total) by mouth every 6 (six) hours as needed for muscle spasms. (Patient not taking: Reported on 09/08/2023) 28 tablet 0   No current facility-administered medications for this visit.    Physical Exam BP 117/75   Pulse (!) 108   Resp 20   Ht 6' (1.829 m)   Wt 170 lb (77.1 kg)   SpO2 95% Comment: RA  BMI 23.65 kg/m  62 year old man in no acute distress Alert and oriented x 3 with no focal deficits Lungs clear with equal breath sounds bilaterally Cardiac mildly tachycardic and regular Incisions well-healed  Diagnostic Tests: I personally reviewed his chest x-ray images.  Postoperative changes on the left no significant effusions or infiltrates.  There is a well-defined, smoothly marginated opacity in the left lower lobe that is unchanged from his previous film. CT HEAD WITHOUT AND WITH CONTRAST   TECHNIQUE: Contiguous axial images  were obtained from the base of the skull through the vertex without and with intravenous contrast.   RADIATION DOSE REDUCTION: This exam was performed according to the departmental dose-optimization program which includes automated exposure control, adjustment of the mA and/or kV according to patient size and/or use of iterative reconstruction technique.   CONTRAST:  60mL ISOVUE-370 IOPAMIDOL (ISOVUE-370) INJECTION 76%   COMPARISON:  05/04/2017 CT.  03/17/2016 MRI.   FINDINGS: Brain: No abnormality seen affecting the brainstem or cerebellum. 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 No new or progressive focal finding. No mass,  hemorrhage, hydrocephalus or extra-axial collection. After contrast administration, no abnormal enhancement occurs.   Vascular: Major vessels at the base of the brain show flow.   Skull: Negative   Sinuses/Orbits: Clear/normal   Other: None   IMPRESSION: No acute or reversible finding. 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 No evidence of metastatic disease.     Electronically Signed   By: Paulina Fusi M.D.   On: 09/06/2023 11:09    Impression: [name] is a 62 year old male with a history of tobacco abuse, alcoholism, bipolar disorder, schizoaffective schizophrenia, depression, type 2 diabetes, gout, hyperlipidemia, and a stage IIb adenocarcinoma of the lung.  Stage IIb adenocarcinoma of the lung-T3, N0.  Status post left upper lobectomy with clear margins on the mediastinal fat.  Plan is for adjuvant chemotherapy with Dr. Arbutus Ped after the first of the year.  Status post left upper lobectomy-doing well.  Has some discomfort but is not requiring any narcotics.  Exercise tolerance is good and denies any respiratory issues.  There is an unusual opacity in the left lower lobe, but not of any clinical significance.  No restrictions on his activities from my standpoint.   Plan: Follow-up as scheduled with Dr. Arbutus Ped Since Dr. Arbutus Ped will be following him closely, he does not need to come back to see me.  However, I am happy to see him back anytime in the future if I can be of any assistance with his care.  [name], MD [facility] [phone]

## 2023-09-29 ENCOUNTER — Inpatient Hospital Stay: Payer: MEDICAID

## 2023-09-29 ENCOUNTER — Inpatient Hospital Stay: Payer: MEDICAID | Attending: Internal Medicine

## 2023-09-29 ENCOUNTER — Inpatient Hospital Stay (HOSPITAL_BASED_OUTPATIENT_CLINIC_OR_DEPARTMENT_OTHER): Payer: MEDICAID | Admitting: Internal Medicine

## 2023-09-29 ENCOUNTER — Other Ambulatory Visit: Payer: Self-pay | Admitting: Medical Oncology

## 2023-09-29 VITALS — BP 105/73 | HR 89 | Temp 97.9°F | Resp 16 | Ht 72.0 in | Wt 168.6 lb

## 2023-09-29 DIAGNOSIS — C3412 Malignant neoplasm of upper lobe, left bronchus or lung: Secondary | ICD-10-CM | POA: Insufficient documentation

## 2023-09-29 DIAGNOSIS — Z5111 Encounter for antineoplastic chemotherapy: Secondary | ICD-10-CM | POA: Insufficient documentation

## 2023-09-29 DIAGNOSIS — Z79899 Other long term (current) drug therapy: Secondary | ICD-10-CM | POA: Insufficient documentation

## 2023-09-29 LAB — CBC WITH DIFFERENTIAL (CANCER CENTER ONLY)
Abs Immature Granulocytes: 0.04 10*3/uL (ref 0.00–0.07)
Basophils Absolute: 0.1 10*3/uL (ref 0.0–0.1)
Basophils Relative: 1 %
Eosinophils Absolute: 0.2 10*3/uL (ref 0.0–0.5)
Eosinophils Relative: 2 %
HCT: 49.6 % (ref 39.0–52.0)
Hemoglobin: 16.4 g/dL (ref 13.0–17.0)
Immature Granulocytes: 0 %
Lymphocytes Relative: 25 %
Lymphs Abs: 2.4 10*3/uL (ref 0.7–4.0)
MCH: 30.1 pg (ref 26.0–34.0)
MCHC: 33.1 g/dL (ref 30.0–36.0)
MCV: 91 fL (ref 80.0–100.0)
Monocytes Absolute: 0.7 10*3/uL (ref 0.1–1.0)
Monocytes Relative: 7 %
Neutro Abs: 6.1 10*3/uL (ref 1.7–7.7)
Neutrophils Relative %: 65 %
Platelet Count: 269 10*3/uL (ref 150–400)
RBC: 5.45 MIL/uL (ref 4.22–5.81)
RDW: 13.5 % (ref 11.5–15.5)
WBC Count: 9.5 10*3/uL (ref 4.0–10.5)
nRBC: 0 % (ref 0.0–0.2)

## 2023-09-29 LAB — CMP (CANCER CENTER ONLY)
ALT: 10 U/L (ref 0–44)
AST: 17 U/L (ref 15–41)
Albumin: 4.7 g/dL (ref 3.5–5.0)
Alkaline Phosphatase: 95 U/L (ref 38–126)
Anion gap: 8 (ref 5–15)
BUN: 13 mg/dL (ref 8–23)
CO2: 30 mmol/L (ref 22–32)
Calcium: 10.5 mg/dL — ABNORMAL HIGH (ref 8.9–10.3)
Chloride: 102 mmol/L (ref 98–111)
Creatinine: 1 mg/dL (ref 0.61–1.24)
GFR, Estimated: >60 mL/min (ref >60)
Glucose, Bld: 100 mg/dL — ABNORMAL HIGH (ref 70–99)
Potassium: 4.6 mmol/L (ref 3.5–5.1)
Sodium: 140 mmol/L (ref 135–145)
Total Bilirubin: 0.5 mg/dL (ref 0.0–1.2)
Total Protein: 8.1 g/dL (ref 6.5–8.1)

## 2023-09-29 MED ORDER — FOLIC ACID 1 MG PO TABS: 1.0000 mg | ORAL_TABLET | Freq: Every day | ORAL | 3 refills | Status: DC

## 2023-09-29 MED ORDER — CYANOCOBALAMIN 1000 MCG/ML IJ SOLN
1000.0000 ug | Freq: Once | INTRAMUSCULAR | Status: AC
Start: 2023-09-29 — End: 2023-09-29
  Administered 2023-09-29: 1000 ug via INTRAMUSCULAR
  Filled 2023-09-29: qty 1

## 2023-09-29 MED ORDER — PROCHLORPERAZINE MALEATE 10 MG PO TABS: 10.0000 mg | ORAL_TABLET | Freq: Four times a day (QID) | ORAL | 1 refills | Status: DC | PRN

## 2023-09-29 MED ORDER — DEXAMETHASONE 4 MG PO TABS: ORAL_TABLET | ORAL | 1 refills | Status: DC

## 2023-09-29 NOTE — Progress Notes (Signed)
 START ON PATHWAY REGIMEN - Non-Small Cell Lung     A cycle is every 21 days:     Pemetrexed       Cisplatin    **Always confirm dose/schedule in your pharmacy ordering system**  Patient Characteristics: Postoperative without Neoadjuvant Therapy (Pathologic Staging), Stage IB (Tumor Size = 4 cm) or Stage II / III, Adjuvant Chemotherapy Planned, Stage IB (Tumor Size = 4 cm), or Stage II / III, Nonsquamous Cell Therapeutic Status: Postoperative without Neoadjuvant Therapy (Pathologic Staging) AJCC M Category: cM0 AJCC 9 Stage Grouping: IIB AJCC N Category: pN0 AJCC T Category: pT3 Check here if patient was staged using an edition other than AJCC Staging 9th Edition: false Adjuvant Chemotherapy Status: Adjuvant Chemotherapy Planned Histology: Nonsquamous Cell Intent of Therapy: Curative Intent, Discussed with Patient

## 2023-09-29 NOTE — Progress Notes (Signed)
 [facility] [phone]   [phone]  OFFICE PROGRESS NOTE  [name], MD [address] [place] [address]  DIAGNOSIS: Stage IIb (T3, N0, M0) non-small cell lung cancer, adenocarcinoma diagnosed in October 2024  PRIOR THERAPY: Status post left upper lobectomy with lymph node dissection under the care of Dr. Kerrin on July 23, 2023.  The tumor measured 1.3 cm with extension into the attached parietal pleural adipose tissue.  CURRENT THERAPY: Adjuvant systemic chemotherapy with cisplatin  75 Mg/M2 and Alimta  500 Mg/M2 every 3 weeks.  First dose October 05, 2022.  INTERVAL HISTORY: [name] 63 y.o. male returns to the clinic today for follow-up visit accompanied by his wife.Discussed the use of AI scribe software for clinical note transcription with the patient, who gave verbal consent to proceed.  History of Present Illness   The patient, a 63 year old individual diagnosed with stage 2B non-small cell lung cancer (adenocarcinoma) in October 2024, underwent a left upper lobectomy and lymph node dissection later that month. The tumor did not show any actionable mutations, but PD-L1 expression was noted at 2%.  Since the surgery, the patient has been experiencing an increased frequency of coughing, which is more severe than before the operation. Despite this, the patient's appetite has improved. The patient was considering chemotherapy as a treatment option and has now decided to proceed with it.  The patient's partner expressed concerns about the persistent coughing, questioning whether it would improve or persist indefinitely. The patient seemed resigned to the possibility of lifelong coughing. The patient and his partner were also curious about the chemotherapy treatment, specifically how the medication differentiates between cancer cells and healthy cells.       MEDICAL HISTORY: Past Medical History:  Diagnosis Date    Alcoholic (HCC)    Bipolar 1 disorder (HCC)    Depression    Diabetes mellitus without complication (HCC)    Per pt he is pre-diabetic   Homeless    Schizoaffective schizophrenia (HCC)     ALLERGIES:  has no known allergies.  MEDICATIONS:  Current Outpatient Medications  Medication Sig Dispense Refill   acetaminophen  (TYLENOL ) 500 MG tablet Take 500-1,000 mg by mouth every 6 (six) hours as needed for moderate pain (pain score 4-6).     cetirizine  (ZYRTEC ) 10 MG tablet Take 10 mg by mouth daily as needed for allergies.     gabapentin  (NEURONTIN ) 300 MG capsule Take 1 capsule (300 mg total) by mouth 2 (two) times daily. (Patient not taking: Reported on 09/08/2023) 60 capsule 0   ibuprofen  (ADVIL ) 200 MG tablet Take 400 mg by mouth every 6 (six) hours as needed for moderate pain (pain score 4-6).     indomethacin  (INDOCIN ) 50 MG capsule Take 1 capsule (50 mg total) by mouth 3 (three) times daily with meals. (Patient taking differently: Take 50 mg by mouth 3 (three) times daily as needed (gout).) 50 capsule 2   methocarbamol  (ROBAXIN ) 500 MG tablet Take 1 tablet (500 mg total) by mouth every 6 (six) hours as needed for muscle spasms. (Patient not taking: Reported on 09/08/2023) 28 tablet 0   No current facility-administered medications for this visit.    SURGICAL HISTORY:  Past Surgical History:  Procedure Laterality Date   COLONOSCOPY  07/16/2023   DENTAL SURGERY     wisdom teeth   INTERCOSTAL NERVE BLOCK Left 07/23/2023   Procedure: INTERCOSTAL NERVE BLOCK;  Surgeon: Kerrin Elspeth BROCKS, MD;  Location: [facility] OR;  Service: Thoracic;  Laterality: Left;   LYMPH NODE BIOPSY Left 07/23/2023   Procedure: LYMPH NODE BIOPSY;  Surgeon: Kerrin Elspeth BROCKS, MD;  Location: [facility] OR;  Service: Thoracic;  Laterality: Left;    REVIEW OF SYSTEMS:  Constitutional: positive for fatigue Eyes: negative Ears, nose, mouth, throat, and face: negative Respiratory: positive for cough Cardiovascular:  negative Gastrointestinal: negative Genitourinary:negative Integument/breast: negative Hematologic/lymphatic: negative Musculoskeletal:negative Neurological: negative Behavioral/Psych: negative Endocrine: negative Allergic/Immunologic: negative   PHYSICAL EXAMINATION: General appearance: alert, cooperative, and no distress Head: Normocephalic, without obvious abnormality, atraumatic Neck: no adenopathy, no JVD, supple, symmetrical, trachea midline, and thyroid not enlarged, symmetric, no tenderness/mass/nodules Lymph nodes: Cervical, supraclavicular, and axillary nodes normal. Resp: clear to auscultation bilaterally Back: symmetric, no curvature. ROM normal. No CVA tenderness. Cardio: regular rate and rhythm, S1, S2 normal, no murmur, click, rub or gallop GI: soft, non-tender; bowel sounds normal; no masses,  no organomegaly Extremities: extremities normal, atraumatic, no cyanosis or edema Neurologic: Alert and oriented X 3, normal strength and tone. Normal symmetric reflexes. Normal coordination and gait  ECOG PERFORMANCE STATUS: 1 - Symptomatic but completely ambulatory  Blood pressure 105/73, pulse 89, temperature 97.9 F (36.6 C), temperature source Temporal, resp. rate 16, height 6' (1.829 m), weight 168 lb 9.6 oz (76.5 kg), SpO2 99%.  LABORATORY DATA: Lab Results  Component Value Date   WBC 9.5 09/29/2023   HGB 16.4 09/29/2023   HCT 49.6 09/29/2023   MCV 91.0 09/29/2023   PLT 269 09/29/2023      Chemistry      Component Value Date/Time   NA 140 09/29/2023 0930   K 4.6 09/29/2023 0930   CL 102 09/29/2023 0930   CO2 30 09/29/2023 0930   BUN 13 09/29/2023 0930   CREATININE 1.00 09/29/2023 0930   CREATININE 1.09 03/24/2023 0000      Component Value Date/Time   CALCIUM  10.5 (H) 09/29/2023 0930   ALKPHOS 95 09/29/2023 0930   AST 17 09/29/2023 0930   ALT 10 09/29/2023 0930   BILITOT 0.5 09/29/2023 0930       RADIOGRAPHIC STUDIES: DG Chest 2 View Result Date:  09/08/2023 CLINICAL DATA:  Lung nodule.  Pneumothorax. EXAM: CHEST - 2 VIEW COMPARISON:  08/10/2023; 07/26/2023 FINDINGS: Unchanged cardiac silhouette and mediastinal contours with postoperative change of the left hilum with associated left-sided volume loss. Interval resolution of previously noted left-sided pneumothorax. Trace amount of loculated pleural fluid is again seen within the left fissure. There is persistent blunting the left costophrenic angle without definite pleural effusion. The right lung remains well aerated. No evidence of edema. No acute osseous abnormalities. Post cancellous screw fixation of the left scapula. IMPRESSION: 1. Interval resolution of previously noted left-sided pneumothorax. 2. Otherwise, unchanged postoperative change of the left hilum with associated left-sided volume loss. 3. Trace amount of loculated fluid again noted within the left fissure. Electronically Signed   By: Norleen Roulette M.D.   On: 09/08/2023 16:20    ASSESSMENT AND PLAN: This is a very pleasant 63 years old white male with Stage IIb (T3, N0, M0) non-small cell lung cancer, adenocarcinoma diagnosed in October 2024 The patient is status post left upper lobectomy with lymph node dissection under the care of Dr. Kerrin on July 23, 2023.  The tumor measured 1.3 cm with extension into the attached parietal pleural adipose tissue. He is here today for evaluation and discussion of adjuvant systemic chemotherapy.    Stage IIB Non-Small Cell Lung Cancer (NSCLC), Adenocarcinoma Diagnosed in October 2024.  Underwent left upper lobectomy and lymph node dissection on July 23, 2023. Tumor negative for actionable mutations, PD-L1 expression 2%. Reports increased post-surgical coughing, likely due to surgical irritation. Discussed adjuvant chemotherapy to prevent recurrence due to potential microscopic residual disease. Noted that significant hair loss is unlikely with this regimen. Emphasized importance of folic  acid and steroids to mitigate side effects. Patient agreed to proceed with chemotherapy. - Administer adjuvant chemotherapy with cisplatin  75 mg/m and Alimta  500 mg/m every three weeks for four cycles - Administer B12 injection today - Prescribe folic acid  1 tablet daily - Prescribe Compazine  for nausea as needed - Prescribe Decadron  4 mg twice a day the day before, the day of, and the day after chemotherapy - Monitor blood counts, electrolytes, kidney function, and liver function before starting treatment - Schedule first chemotherapy session for next Tuesday  Post-Surgical Cough Increased coughing post-lobectomy, likely due to surgical irritation. Expected to improve over time. - Monitor symptoms and reassess if coughing persists or worsens  General Health Maintenance Discussed managing chemotherapy side effects and maintaining overall health during treatment. - Educate on the importance of folic acid  to prevent chemotherapy side effects - Reassure that significant hair loss is unlikely with this chemotherapy regimen  Follow-up - Schedule follow-up appointments to monitor treatment response and side effects - Ensure all prescriptions are sent to the patient's pharmacy ([facility] on [address]).   The patient was advised to call immediately if he has any other concerning symptoms in the interval. The patient voices understanding of current disease status and treatment options and is in agreement with the current care plan.  All questions were answered. The patient knows to call the clinic with any problems, questions or concerns. We can certainly see the patient much sooner if necessary.  The total time spent in the appointment was 30 minutes.  Disclaimer: This note was dictated with voice recognition software. Similar sounding words can inadvertently be transcribed and may not be corrected upon review.

## 2023-09-29 NOTE — Progress Notes (Signed)
 I met with the pt today at his follow up to discuss his course of treatment for his stage IIB adenocarcinoma of LUL. Pt is accompanied be his friend, BJ.   Pt's molecular studies did not reveal an actionable mutation. Pt has opted to proceed with systemic treatment with Cisplatin /Pem every 3 weeks for 4 cycles with a start date of 1/14. Pt understands his treatment will be intravenous and last approx 3hrs and that this will be done every 3 weeks 4 times. Pt understands that he will need weekly labs and a 1 week post infusion f/u appt after his first cycle for evaluation.  Pt's chemo education class with be on 1/10 at 2pm. Pt is aware he will have a Rx for folic acid  which he will start today and will also need a B12 injection. Rxs for steroids and nausea medicine will also be ordered.  Pt denied questions at the conclusion of the appt. Pt uses MyChart and I let him know his appts will be visible on that platform. I encouraged the pt and BJ to call me if either of them have any questions.

## 2023-09-30 ENCOUNTER — Other Ambulatory Visit: Payer: Self-pay

## 2023-10-01 NOTE — Progress Notes (Signed)
 Pharmacist Chemotherapy Monitoring - Initial Assessment    Anticipated start date: 10/06/23   The following has been reviewed per standard work regarding the patient's treatment regimen: The patient's diagnosis, treatment plan and drug doses, and organ/hematologic function Lab orders and baseline tests specific to treatment regimen  The treatment plan start date, drug sequencing, and pre-medications Prior authorization status  Patient's documented medication list, including drug-drug interaction screen and prescriptions for anti-emetics and supportive care specific to the treatment regimen The drug concentrations, fluid compatibility, administration routes, and timing of the medications to be used The patient's access for treatment and lifetime cumulative dose history, if applicable  The patient's medication allergies and previous infusion related reactions, if applicable   Changes made to treatment plan:  N/A  Follow up needed:  N/A   Elyn Krogh Kreul, RPH, 10/01/2023  3:28 PM

## 2023-10-02 ENCOUNTER — Other Ambulatory Visit: Payer: Self-pay

## 2023-10-02 ENCOUNTER — Inpatient Hospital Stay: Payer: MEDICAID

## 2023-10-03 ENCOUNTER — Other Ambulatory Visit: Payer: Self-pay

## 2023-10-05 ENCOUNTER — Encounter: Payer: Self-pay | Admitting: Medical Oncology

## 2023-10-05 NOTE — Progress Notes (Signed)
 Per Dr. Arbutus Ped , pt does not need labs on day of first treatment  10/06/23. It is ok to use  lab result from 01/07.

## 2023-10-06 ENCOUNTER — Encounter: Payer: Self-pay | Admitting: Internal Medicine

## 2023-10-06 ENCOUNTER — Telehealth: Payer: Self-pay | Admitting: Medical Oncology

## 2023-10-06 ENCOUNTER — Other Ambulatory Visit: Payer: Self-pay | Admitting: Medical Oncology

## 2023-10-06 ENCOUNTER — Other Ambulatory Visit: Payer: MEDICAID

## 2023-10-06 ENCOUNTER — Inpatient Hospital Stay: Payer: MEDICAID

## 2023-10-06 VITALS — BP 146/86 | HR 98 | Temp 98.2°F | Resp 18

## 2023-10-06 DIAGNOSIS — I878 Other specified disorders of veins: Secondary | ICD-10-CM

## 2023-10-06 DIAGNOSIS — Z5111 Encounter for antineoplastic chemotherapy: Secondary | ICD-10-CM | POA: Diagnosis not present

## 2023-10-06 DIAGNOSIS — C3412 Malignant neoplasm of upper lobe, left bronchus or lung: Secondary | ICD-10-CM

## 2023-10-06 MED ORDER — DEXAMETHASONE SODIUM PHOSPHATE 10 MG/ML IJ SOLN
10.0000 mg | Freq: Once | INTRAMUSCULAR | Status: AC
  Administered 2023-10-06: 10 mg via INTRAVENOUS
  Filled 2023-10-06: qty 1

## 2023-10-06 MED ORDER — SODIUM CHLORIDE 0.9 % IV SOLN
75.0000 mg/m2 | Freq: Once | INTRAVENOUS | Status: AC
  Administered 2023-10-06: 150 mg via INTRAVENOUS
  Filled 2023-10-06: qty 50

## 2023-10-06 MED ORDER — POTASSIUM CHLORIDE IN NACL 20-0.9 MEQ/L-% IV SOLN
Freq: Once | INTRAVENOUS | Status: AC
  Filled 2023-10-06: qty 1000

## 2023-10-06 MED ORDER — PALONOSETRON HCL INJECTION 0.25 MG/5ML
0.2500 mg | Freq: Once | INTRAVENOUS | Status: AC
Start: 2023-10-06 — End: 2023-10-06
  Administered 2023-10-06: 0.25 mg via INTRAVENOUS
  Filled 2023-10-06: qty 5

## 2023-10-06 MED ORDER — FOSAPREPITANT DIMEGLUMINE INJECTION 150 MG
150.0000 mg | Freq: Once | INTRAVENOUS | Status: AC
  Administered 2023-10-06: 150 mg via INTRAVENOUS
  Filled 2023-10-06: qty 150

## 2023-10-06 MED ORDER — MAGNESIUM SULFATE 2 GM/50ML IV SOLN
2.0000 g | Freq: Once | INTRAVENOUS | Status: AC
  Administered 2023-10-06: 2 g via INTRAVENOUS
  Filled 2023-10-06: qty 50

## 2023-10-06 MED ORDER — PEMETREXED DISODIUM CHEMO INJECTION 500 MG
500.0000 mg/m2 | Freq: Once | INTRAVENOUS | Status: AC
  Administered 2023-10-06: 1000 mg via INTRAVENOUS
  Filled 2023-10-06: qty 40

## 2023-10-06 MED ORDER — SODIUM CHLORIDE 0.9 % IV SOLN: INTRAVENOUS | Status: DC

## 2023-10-06 NOTE — Progress Notes (Signed)
 Patient reported to infusion room for treatment.  Multiple PIV attempts made before a PIV was achieved for treatment.  Patient stated this is not the first time it took multiple attempts before achieving a successful PIV. Patient was educated on port a cath and stated he would like to pursue placement of a port.  Patient called and spoke to Dr. Jeannett Nurse and port a cath placement was scheduled.

## 2023-10-06 NOTE — Patient Instructions (Signed)
 [facility] - A DEPT OF [facility]  Discharge Instructions: Thank you for choosing [facility] to provide your oncology and hematology care.   If you have a lab appointment with the Cancer Center, please go directly to the Cancer Center and check in at the registration area.   Wear comfortable clothing and clothing appropriate for easy access to any Portacath or PICC line.   We strive to give you quality time with your provider. You may need to reschedule your appointment if you arrive late (15 or more minutes).  Arriving late affects you and other patients whose appointments are after yours.  Also, if you miss three or more appointments without notifying the office, you may be dismissed from the clinic at the provider's discretion.      For prescription refill requests, have your pharmacy contact our office and allow 72 hours for refills to be completed.    Today you received the following chemotherapy and/or immunotherapy agents: pemetrexed , cisplatin        To help prevent nausea and vomiting after your treatment, we encourage you to take your nausea medication as directed.  BELOW ARE SYMPTOMS THAT SHOULD BE REPORTED IMMEDIATELY: *FEVER GREATER THAN 100.4 F (38 C) OR HIGHER *CHILLS OR SWEATING *NAUSEA AND VOMITING THAT IS NOT CONTROLLED WITH YOUR NAUSEA MEDICATION *UNUSUAL SHORTNESS OF BREATH *UNUSUAL BRUISING OR BLEEDING *URINARY PROBLEMS (pain or burning when urinating, or frequent urination) *BOWEL PROBLEMS (unusual diarrhea, constipation, pain near the anus) TENDERNESS IN MOUTH AND THROAT WITH OR WITHOUT PRESENCE OF ULCERS (sore throat, sores in mouth, or a toothache) UNUSUAL RASH, SWELLING OR PAIN  UNUSUAL VAGINAL DISCHARGE OR ITCHING   Items with * indicate a potential emergency and should be followed up as soon as possible or go to the Emergency Department if any problems should occur.  Please show the CHEMOTHERAPY ALERT CARD or  IMMUNOTHERAPY ALERT CARD at check-in to the Emergency Department and triage nurse.  Should you have questions after your visit or need to cancel or reschedule your appointment, please contact [facility] - A DEPT OF [facility]  Dept: [phone]  and follow the prompts.  Office hours are 8:00 a.m. to 4:30 p.m. Monday - Friday. Please note that voicemails left after 4:00 p.m. may not be returned until the following business day.  We are closed weekends and major holidays. You have access to a nurse at all times for urgent questions. Please call the main number to the clinic Dept: [phone] and follow the prompts.   For any non-urgent questions, you may also contact your provider using MyChart. We now offer e-Visits for anyone 55 and older to request care online for non-urgent symptoms. For details visit [email or web address].   Also download the MyChart app! Go to the app store, search MyChart, open the app, select [facility], and log in with your MyChart username and password.  Pemetrexed  Injection What is this medication? PEMETREXED  (PEM e TREX ed) treats some types of cancer. It works by slowing down the growth of cancer cells. This medicine may be used for other purposes; ask your health care provider or pharmacist if you have questions. COMMON BRAND NAME(S): Alimta , PEMFEXY, PEMRYDI RTU What should I tell my care team before I take this medication? They need to know if you have any of these conditions: Infection, such as chickenpox, cold sores, or herpes Kidney disease Low blood cell levels (white cells, red cells, and  platelets) Lung or breathing disease, such as asthma Radiation therapy An unusual or allergic reaction to pemetrexed , other medications, foods, dyes, or preservatives If you or your partner are pregnant or trying to get pregnant Breast-feeding How should I use this medication? This medication is injected into a vein. It is given by  your care team in a hospital or clinic setting. Talk to your care team about the use of this medication in children. Special care may be needed. Overdosage: If you think you have taken too much of this medicine contact a poison control center or emergency room at once. NOTE: This medicine is only for you. Do not share this medicine with others. What if I miss a dose? Keep appointments for follow-up doses. It is important not to miss your dose. Call your care team if you are unable to keep an appointment. What may interact with this medication? Do not take this medication with any of the following: Live virus vaccines This medication may also interact with the following: Ibuprofen  This list may not describe all possible interactions. Give your health care provider a list of all the medicines, herbs, non-prescription drugs, or dietary supplements you use. Also tell them if you smoke, drink alcohol, or use illegal drugs. Some items may interact with your medicine. What should I watch for while using this medication? Your condition will be monitored carefully while you are receiving this medication. This medication may make you feel generally unwell. This is not uncommon as chemotherapy can affect healthy cells as well as cancer cells. Report any side effects. Continue your course of treatment even though you feel ill unless your care team tells you to stop. This medication can cause serious side effects. To reduce the risk, your care team may give you other medications to take before receiving this one. Be sure to follow the directions from your care team. This medication can cause a rash or redness in areas of the body that have previously had radiation therapy. If you have had radiation therapy, tell your care team if you notice a rash in this area. This medication may increase your risk of getting an infection. Call your care team for advice if you get a fever, chills, sore throat, or other symptoms  of a cold or flu. Do not treat yourself. Try to avoid being around people who are sick. Be careful brushing or flossing your teeth or using a toothpick because you may get an infection or bleed more easily. If you have any dental work done, tell your dentist you are receiving this medication. Avoid taking medications that contain aspirin , acetaminophen , ibuprofen , naproxen, or ketoprofen unless instructed by your care team. These medications may hide a fever. Check with your care team if you have severe diarrhea, nausea, and vomiting, or if you sweat a lot. The loss of too much body fluid may make it dangerous for you to take this medication. Talk to your care team if you or your partner wish to become pregnant or think either of you might be pregnant. This medication can cause serious birth defects if taken during pregnancy and for 6 months after the last dose. A negative pregnancy test is required before starting this medication. A reliable form of contraception is recommended while taking this medication and for 6 months after the last dose. Talk to your care team about reliable forms of contraception. Do not father a child while taking this medication and for 3 months after the last dose. Use a  condom while having sex during this time period. Do not breastfeed while taking this medication and for 1 week after the last dose. This medication may cause infertility. Talk to your care team if you are concerned about your fertility. What side effects may I notice from receiving this medication? Side effects that you should report to your care team as soon as possible: Allergic reactions--skin rash, itching, hives, swelling of the face, lips, tongue, or throat Dry cough, shortness of breath or trouble breathing Infection--fever, chills, cough, sore throat, wounds that don't heal, pain or trouble when passing urine, general feeling of discomfort or being unwell Kidney injury--decrease in the amount of urine,  swelling of the ankles, hands, or feet Low red blood cell level--unusual weakness or fatigue, dizziness, headache, trouble breathing Redness, blistering, peeling, or loosening of the skin, including inside the mouth Unusual bruising or bleeding Side effects that usually do not require medical attention (report to your care team if they continue or are bothersome): Fatigue Loss of appetite Nausea Vomiting This list may not describe all possible side effects. Call your doctor for medical advice about side effects. You may report side effects to FDA at 1-800-FDA-1088. Where should I keep my medication? This medication is given in a hospital or clinic. It will not be stored at home. NOTE: This sheet is a summary. It may not cover all possible information. If you have questions about this medicine, talk to your doctor, pharmacist, or health care provider.  2024 Elsevier/Gold Standard (2022-01-14 00:00:00) Cisplatin  Injection What is this medication? CISPLATIN  (SIS pla tin) treats some types of cancer. It works by slowing down the growth of cancer cells. This medicine may be used for other purposes; ask your health care provider or pharmacist if you have questions. COMMON BRAND NAME(S): Platinol , Platinol  -AQ What should I tell my care team before I take this medication? They need to know if you have any of these conditions: Eye disease, vision problems Hearing problems Kidney disease Low blood counts, such as low white cells, platelets, or red blood cells Tingling of the fingers or toes, or other nerve disorder An unusual or allergic reaction to cisplatin , carboplatin, oxaliplatin, other medications, foods, dyes, or preservatives If you or your partner are pregnant or trying to get pregnant Breast-feeding How should I use this medication? This medication is injected into a vein. It is given by your care team in a hospital or clinic setting. Talk to your care team about the use of this  medication in children. Special care may be needed. Overdosage: If you think you have taken too much of this medicine contact a poison control center or emergency room at once. NOTE: This medicine is only for you. Do not share this medicine with others. What if I miss a dose? Keep appointments for follow-up doses. It is important not to miss your dose. Call your care team if you are unable to keep an appointment. What may interact with this medication? Do not take this medication with any of the following: Live virus vaccines This medication may also interact with the following: Certain antibiotics, such as amikacin, gentamicin, neomycin, polymyxin B, streptomycin, tobramycin, vancomycin Foscarnet This list may not describe all possible interactions. Give your health care provider a list of all the medicines, herbs, non-prescription drugs, or dietary supplements you use. Also tell them if you smoke, drink alcohol, or use illegal drugs. Some items may interact with your medicine. What should I watch for while using this medication? Your  condition will be monitored carefully while you are receiving this medication. You may need blood work done while taking this medication. This medication may make you feel generally unwell. This is not uncommon, as chemotherapy can affect healthy cells as well as cancer cells. Report any side effects. Continue your course of treatment even though you feel ill unless your care team tells you to stop. This medication may increase your risk of getting an infection. Call your care team for advice if you get a fever, chills, sore throat, or other symptoms of a cold or flu. Do not treat yourself. Try to avoid being around people who are sick. Avoid taking medications that contain aspirin , acetaminophen , ibuprofen , naproxen, or ketoprofen unless instructed by your care team. These medications may hide a fever. This medication may increase your risk to bruise or bleed. Call  your care team if you notice any unusual bleeding. Be careful brushing or flossing your teeth or using a toothpick because you may get an infection or bleed more easily. If you have any dental work done, tell your dentist you are receiving this medication. Drink fluids as directed while you are taking this medication. This will help protect your kidneys. Call your care team if you get diarrhea. Do not treat yourself. Talk to your care team if you or your partner wish to become pregnant or think you might be pregnant. This medication can cause serious birth defects if taken during pregnancy and for 14 months after the last dose. A negative pregnancy test is required before starting this medication. A reliable form of contraception is recommended while taking this medication and for 14 months after the last dose. Talk to your care team about effective forms of contraception. Do not father a child while taking this medication and for 11 months after the last dose. Use a condom during sex during this time period. Do not breast-feed while taking this medication. This medication may cause infertility. Talk to your care team if you are concerned about your fertility. What side effects may I notice from receiving this medication? Side effects that you should report to your care team as soon as possible: Allergic reactions--skin rash, itching, hives, swelling of the face, lips, tongue, or throat Eye pain, change in vision, vision loss Hearing loss, ringing in ears Infection--fever, chills, cough, sore throat, wounds that don't heal, pain or trouble when passing urine, general feeling of discomfort or being unwell Kidney injury--decrease in the amount of urine, swelling of the ankles, hands, or feet Low red blood cell level--unusual weakness or fatigue, dizziness, headache, trouble breathing Painful swelling, warmth, or redness of the skin, blisters or sores at the infusion site Pain, tingling, or numbness in  the hands or feet Unusual bruising or bleeding Side effects that usually do not require medical attention (report to your care team if they continue or are bothersome): Hair loss Nausea Vomiting This list may not describe all possible side effects. Call your doctor for medical advice about side effects. You may report side effects to FDA at 1-800-FDA-1088. Where should I keep my medication? This medication is given in a hospital or clinic. It will not be stored at home. NOTE: This sheet is a summary. It may not cover all possible information. If you have questions about this medicine, talk to your doctor, pharmacist, or health care provider.  2024 Elsevier/Gold Standard (2022-01-10 00:00:00)

## 2023-10-06 NOTE — Telephone Encounter (Signed)
 LVM on pts phone with lab appt time on 01/28

## 2023-10-08 ENCOUNTER — Telehealth: Payer: Self-pay | Admitting: Medical Oncology

## 2023-10-08 NOTE — Telephone Encounter (Signed)
"   I am having second thoughts". He did not say what he was referring to . LVM to return my call.

## 2023-10-13 ENCOUNTER — Encounter: Payer: Self-pay | Admitting: Internal Medicine

## 2023-10-13 ENCOUNTER — Telehealth: Payer: Self-pay | Admitting: Medical Oncology

## 2023-10-13 ENCOUNTER — Inpatient Hospital Stay: Payer: MEDICAID

## 2023-10-13 NOTE — Telephone Encounter (Signed)
Pt cancelled lab appt today.   "No energy . I can't get out in the cold", I explained the risk of low Magnesium from Cisplatin and that weakness is a sign .  I offered to r/s the appt to later this week and he declined. I told him low mag can cause weakness and affect the  heart function.  "I cannot predict the future" He proceeded to tell me about his first tx and difficulty with iv access said he will consider keeping the port a cath appt on 01/28 . He said he will call back to r/s appt.

## 2023-10-19 ENCOUNTER — Other Ambulatory Visit: Payer: Self-pay | Admitting: Radiology

## 2023-10-20 ENCOUNTER — Ambulatory Visit (HOSPITAL_COMMUNITY)
Admission: RE | Admit: 2023-10-20 | Discharge: 2023-10-20 | Disposition: A | Discharge status: Home / Self Care | Payer: MEDICAID | Source: Ambulatory Visit | Attending: Internal Medicine | Admitting: Internal Medicine

## 2023-10-20 ENCOUNTER — Inpatient Hospital Stay: Payer: MEDICAID

## 2023-10-20 ENCOUNTER — Ambulatory Visit (HOSPITAL_COMMUNITY): Payer: MEDICAID

## 2023-10-20 DIAGNOSIS — C3412 Malignant neoplasm of upper lobe, left bronchus or lung: Secondary | ICD-10-CM

## 2023-10-20 DIAGNOSIS — Z5111 Encounter for antineoplastic chemotherapy: Secondary | ICD-10-CM | POA: Diagnosis not present

## 2023-10-20 LAB — CMP (CANCER CENTER ONLY)
ALT: 11 U/L (ref 0–44)
AST: 15 U/L (ref 15–41)
Albumin: 4.2 g/dL (ref 3.5–5.0)
Alkaline Phosphatase: 84 U/L (ref 38–126)
Anion gap: 8 (ref 5–15)
BUN: 10 mg/dL (ref 8–23)
CO2: 30 mmol/L (ref 22–32)
Calcium: 9.9 mg/dL (ref 8.9–10.3)
Chloride: 99 mmol/L (ref 98–111)
Creatinine: 1.01 mg/dL (ref 0.61–1.24)
GFR, Estimated: >60 mL/min (ref >60)
Glucose, Bld: 94 mg/dL (ref 70–99)
Potassium: 4.2 mmol/L (ref 3.5–5.1)
Sodium: 137 mmol/L (ref 135–145)
Total Bilirubin: 0.5 mg/dL (ref 0.0–1.2)
Total Protein: 7.4 g/dL (ref 6.5–8.1)

## 2023-10-20 LAB — CBC WITH DIFFERENTIAL (CANCER CENTER ONLY)
Abs Immature Granulocytes: 0.02 10*3/uL (ref 0.00–0.07)
Basophils Absolute: 0 10*3/uL (ref 0.0–0.1)
Basophils Relative: 0 %
Eosinophils Absolute: 0.1 10*3/uL (ref 0.0–0.5)
Eosinophils Relative: 2 %
HCT: 44.9 % (ref 39.0–52.0)
Hemoglobin: 14.8 g/dL (ref 13.0–17.0)
Immature Granulocytes: 0 %
Lymphocytes Relative: 28 %
Lymphs Abs: 1.9 10*3/uL (ref 0.7–4.0)
MCH: 29.9 pg (ref 26.0–34.0)
MCHC: 33 g/dL (ref 30.0–36.0)
MCV: 90.7 fL (ref 80.0–100.0)
Monocytes Absolute: 0.6 10*3/uL (ref 0.1–1.0)
Monocytes Relative: 9 %
Neutro Abs: 4 10*3/uL (ref 1.7–7.7)
Neutrophils Relative %: 61 %
Platelet Count: 200 10*3/uL (ref 150–400)
RBC: 4.95 MIL/uL (ref 4.22–5.81)
RDW: 13.8 % (ref 11.5–15.5)
WBC Count: 6.6 10*3/uL (ref 4.0–10.5)
nRBC: 0 % (ref 0.0–0.2)

## 2023-10-20 LAB — MAGNESIUM: Magnesium: 2.1 mg/dL (ref 1.7–2.4)

## 2023-10-21 ENCOUNTER — Other Ambulatory Visit: Payer: Self-pay

## 2023-10-22 ENCOUNTER — Telehealth: Payer: Self-pay | Admitting: Internal Medicine

## 2023-10-26 ENCOUNTER — Inpatient Hospital Stay: Payer: MEDICAID

## 2023-10-26 ENCOUNTER — Telehealth: Payer: Self-pay | Admitting: *Deleted

## 2023-10-26 ENCOUNTER — Inpatient Hospital Stay: Payer: MEDICAID | Admitting: Internal Medicine

## 2023-10-26 MED FILL — Fosaprepitant Dimeglumine For IV Infusion 150 MG (Base Eq): INTRAVENOUS | Qty: 5 | Status: AC

## 2023-10-26 NOTE — Telephone Encounter (Signed)
Telephone call to patient to explain treatment requires he get labs weekly. Patient yelled at this RN stating it would be the same as it was 6 days ago. This RN tried to explain again that with the administration of chemotherapy we have to closely watch his lab results. His treatment would need to be rescheduled if the labs were off. Patient became very upset and shouted his labs wouldn't change in 6 days. He terminated call.   Discussed with Dr. Arbutus Ped. Patient needs to have labs and follow up prior to treatment. Message sent to have his appointments rescheduled.

## 2023-10-26 NOTE — Progress Notes (Signed)
I reached out to the pt today to find out more about his canceled appointments. I called the pts cell phone, but his caregiver, BJ, answered the phone. BJ states that because the pt was feeling okay and that his labs were less than a week old, the pt thought he didn't need to come to his lab appt and f/u appt with Dr Arbutus Ped. I explained that his appts were scheduled the day prior because he chemotherapy treatment was starting too early in the day for him to get labs drawn and to see Dr Arbutus Ped. BJ states "well we didn't know that. We were handed a piece of paper that says every (Tuesday) he will get labs and see the doctor". I verbalized understanding, however adjustments will have to be made occasionally to accommodate the pt, the infusion, the provider appt, etc. And that the pt has to be seen and have labs before he can get treatment, even if he is feeling well and his labs were done less than a week prior. Scheduler is working with infusion room charge nurse to find a spot for the pt next week. There was a f/u slot with C.Heilingoetter, Onc PA on 2/5 at 9am with labs at 8:30am. I told BJ that he needs labs and to come to that appt in order for him to get treatment next week. I also explained that the rest of his infusion appts will have to be shifted down a week. BJ verbalized understanding.

## 2023-10-27 ENCOUNTER — Encounter: Payer: Self-pay | Admitting: Internal Medicine

## 2023-10-27 ENCOUNTER — Inpatient Hospital Stay: Payer: MEDICAID

## 2023-10-27 NOTE — Progress Notes (Signed)
 [facility] OFFICE PROGRESS NOTE  Troy Atlas, MD [address] [place] [address]  DIAGNOSIS: Stage IIb (T3, N0, M0) non-small cell lung cancer, adenocarcinoma diagnosed in October 2024   PDL1: 2%  PRIOR THERAPY: Status post left upper lobectomy with lymph node dissection under the care of Dr. Kerrin on July 23, 2023. The tumor measured 1.3 cm with extension into the attached parietal pleural adipose tissue.   CURRENT THERAPY: Adjuvant systemic chemotherapy with cisplatin  75 Mg/M2 and Alimta  500 Mg/M2 every 3 weeks.  First dose October 05, 2022.   INTERVAL HISTORY: [name] 63 y.o. male returns to the clinic today for a follow up visit accompanied by his friend. The patient is currently undergoing adjuvant chemotherapy. He is status post his first cycle of treatment and has tolerated it reportedly fine. He also had difficulty with IV access with his first infusion. He was scheduled for a port-a-cath on 10/20/23 but cancelled it due to having second thoughts. He decided that he is not interested in a port at this time.   The patient also missed some of his weekly lab appointments. In the interval and RN discussed the rationale for labs. He does not have any questions about this.   Today, the patient re-iterated that he is fine. He reports his energy was low but he rested.  He denies any fever, chills, night sweats, or unexplained weight loss. He he has a good appetite.  At his last appointment he was endorsing a cough.  He takes over-the-counter cough medication.  He tributes this to smoking.  He also uses cough drops.  The patient does not feel like he needs a chest x-ray today.  He otherwise states his breathing is all right and denies any worsening [place] exertion although his lung capacity is not what it was prior to last year.  He denies any hemoptysis.  Denies any nausea, vomiting, diarrhea, or constipation.  Denies any vision changes.  He  reports he has chronic headaches and he is wondering who to see regarding this.  He takes Tylenol .  His staging neuroimaging was negative for metastatic disease.  He is here today for evaluation repeat blood work before undergoing cycle #2 tomorrow.   MEDICAL HISTORY: Past Medical History:  Diagnosis Date   Alcoholic (HCC)    Bipolar 1 disorder (HCC)    Depression    Diabetes mellitus without complication (HCC)    Per pt he is pre-diabetic   Homeless    Schizoaffective schizophrenia (HCC)     ALLERGIES:  has no known allergies.  MEDICATIONS:  Current Outpatient Medications  Medication Sig Dispense Refill   acetaminophen  (TYLENOL ) 500 MG tablet Take 500-1,000 mg by mouth every 6 (six) hours as needed for moderate pain (pain score 4-6).     cetirizine  (ZYRTEC ) 10 MG tablet Take 10 mg by mouth daily as needed for allergies.     dexamethasone  (DECADRON ) 4 MG tablet Take 1 tab 2 times daily starting day before pemetrexed . Then take 2 tabs daily x 3 days starting day after cisplatin . Take with food. 30 tablet 1   folic acid  (FOLVITE ) 1 MG tablet Take 1 tablet (1 mg total) by mouth daily. Start 7 days before pemetrexed  chemotherapy. Continue until 21 days after pemetrexed  completed. 100 tablet 3   gabapentin  (NEURONTIN ) 300 MG capsule Take 1 capsule (300 mg total) by mouth 2 (two) times daily. (Patient not taking: Reported on 09/08/2023) 60 capsule 0   ibuprofen  (ADVIL ) 200  MG tablet Take 400 mg by mouth every 6 (six) hours as needed for moderate pain (pain score 4-6).     indomethacin  (INDOCIN ) 50 MG capsule Take 1 capsule (50 mg total) by mouth 3 (three) times daily with meals. (Patient taking differently: Take 50 mg by mouth 3 (three) times daily as needed (gout).) 50 capsule 2   methocarbamol  (ROBAXIN ) 500 MG tablet Take 1 tablet (500 mg total) by mouth every 6 (six) hours as needed for muscle spasms. (Patient not taking: Reported on 09/08/2023) 28 tablet 0   prochlorperazine  (COMPAZINE ) 10  MG tablet Take 1 tablet (10 mg total) by mouth every 6 (six) hours as needed for nausea or vomiting. 30 tablet 1   No current facility-administered medications for this visit.    SURGICAL HISTORY:  Past Surgical History:  Procedure Laterality Date   COLONOSCOPY  07/16/2023   DENTAL SURGERY     wisdom teeth   INTERCOSTAL NERVE BLOCK Left 07/23/2023   Procedure: INTERCOSTAL NERVE BLOCK;  Surgeon: Kerrin Elspeth BROCKS, MD;  Location: [facility] OR;  Service: Thoracic;  Laterality: Left;   LYMPH NODE BIOPSY Left 07/23/2023   Procedure: LYMPH NODE BIOPSY;  Surgeon: Kerrin Elspeth BROCKS, MD;  Location: [facility] OR;  Service: Thoracic;  Laterality: Left;    REVIEW OF SYSTEMS:   Review of Systems  Constitutional: Positive for fatigue. Negative for appetite change, chills, fever and unexpected weight change.  HENT: Negative for mouth sores, nosebleeds, sore throat and trouble swallowing.   Eyes: Negative for eye problems and icterus.  Respiratory: Positive for intermittent cough. Negative for hemoptysis, shortness of breath and wheezing.   Cardiovascular: Negative for chest pain and leg swelling.  Gastrointestinal: Negative for abdominal pain, constipation, diarrhea, nausea and vomiting.  Genitourinary: Negative for bladder incontinence, difficulty urinating, dysuria, frequency and hematuria.   Musculoskeletal: Negative for back pain, gait problem, neck pain and neck stiffness.  Skin: Negative for itching and rash.  Neurological: Positive for headaches. Negative for dizziness, extremity weakness, gait problem, light-headedness and seizures.  Hematological: Negative for adenopathy. Does not bruise/bleed easily.  Psychiatric/Behavioral: Negative for confusion, depression and sleep disturbance. The patient is not nervous/anxious.     PHYSICAL EXAMINATION:  There were no vitals taken for this visit.  ECOG PERFORMANCE STATUS: 1  Physical Exam  Constitutional: Oriented to person, place, and time and thin  appearing male and in no distress.  HENT:  Head: Normocephalic and atraumatic.  Mouth/Throat: Oropharynx is clear and moist. No oropharyngeal exudate.  Eyes: Conjunctivae are normal. Right eye exhibits no discharge. Left eye exhibits no discharge. No scleral icterus.  Neck: Normal range of motion. Neck supple.  Cardiovascular: Normal rate, regular rhythm, normal heart sounds and intact distal pulses.   Pulmonary/Chest: Effort normal and breath sounds normal. No respiratory distress. No wheezes. No rales.  Abdominal: Soft. Bowel sounds are normal. Exhibits no distension and no mass. There is no tenderness.  Musculoskeletal: Normal range of motion. Exhibits no edema.  Lymphadenopathy:    No cervical adenopathy.  Neurological: Alert and oriented to person, place, and time. Exhibits normal muscle tone. Gait normal. Coordination normal.  Skin: Skin is warm and dry. No rash noted. Not diaphoretic. No erythema. No pallor.  Psychiatric: Mood, memory and judgment normal.  Vitals reviewed.  LABORATORY DATA: Lab Results  Component Value Date   WBC 6.6 10/20/2023   HGB 14.8 10/20/2023   HCT 44.9 10/20/2023   MCV 90.7 10/20/2023   PLT 200 10/20/2023      Chemistry  Component Value Date/Time   NA 137 10/20/2023 1103   K 4.2 10/20/2023 1103   CL 99 10/20/2023 1103   CO2 30 10/20/2023 1103   BUN 10 10/20/2023 1103   CREATININE 1.01 10/20/2023 1103   CREATININE 1.09 03/24/2023 0000      Component Value Date/Time   CALCIUM  9.9 10/20/2023 1103   ALKPHOS 84 10/20/2023 1103   AST 15 10/20/2023 1103   ALT 11 10/20/2023 1103   BILITOT 0.5 10/20/2023 1103       RADIOGRAPHIC STUDIES:  No results found.   ASSESSMENT/PLAN:  This is a very pleasant 63 year old Caucasian male with stage IIb (T3, N0, M0) non-small cell lung cancer, adenocarcinoma.  He was diagnosed in October 2024.  His PD-L1 expression is 2%.  The patient underwent left upper lobectomy with lymph node dissection on  07/23/2023.  His tumor is negative for actionable mutations.  The patient is currently undergoing 4 cycles of adjuvant chemotherapy to prevent recurrence due to potential for microscopic residual disease.  He is currently undergoing treatment with cisplatin  75 mg/m and Alimta  500 mg/m IV every 3 weeks.  The plan is to undergo 4 cycles.  He is status post his first cycle.   Labs were reviewed.  Recommend he proceed with cycle #2 tomorrow as scheduled.   Discussed the rationale behind monitoring weekly labs due to concerns for cytopenias, renal insufficiency, electrolyte derangements, etc. that may require intervention.  Encouraged him to hydrate well prior to lab draws and infusion due to him being a difficult IV stick.  The patient has decided against a Port-A-Cath.  Patient has had a persistent intermittent cough for the last several weeks.  He overall states his cough is similar to his last appointment.  I offered a chest x-ray which the patient did not feel is necessary.  I let him know that I am always happy to arrange for a chest x-ray if he changes his mind or has worsening symptoms.  Sent him a prescription for Tessalon  if needed for cough in addition to his cough drops and cough medication.  We will see him back for follow-up visit in 3 weeks for evaluation repeat blood work before undergoing cycle #3.  A release of information form was signed so that we may talk to his friend, BJ, with his health information if needed.  The patient was advised to call immediately if he has any concerning symptoms in the interval. The patient voices understanding of current disease status and treatment options and is in agreement with the current care plan. All questions were answered. The patient knows to call the clinic with any problems, questions or concerns. We can certainly see the patient much sooner if necessary     No orders of the defined types were placed in this encounter.   The total  time spent in the appointment was 20-29 minutes  Camisha Srey L Gretna Bergin, PA-C 10/27/23

## 2023-10-28 ENCOUNTER — Inpatient Hospital Stay (HOSPITAL_BASED_OUTPATIENT_CLINIC_OR_DEPARTMENT_OTHER): Payer: MEDICAID | Admitting: Physician Assistant

## 2023-10-28 ENCOUNTER — Ambulatory Visit: Payer: MEDICAID | Admitting: Physician Assistant

## 2023-10-28 ENCOUNTER — Inpatient Hospital Stay: Payer: MEDICAID | Attending: Internal Medicine

## 2023-10-28 ENCOUNTER — Other Ambulatory Visit: Payer: MEDICAID

## 2023-10-28 VITALS — BP 117/86 | HR 113 | Temp 98.3°F | Resp 16 | Wt 166.7 lb

## 2023-10-28 DIAGNOSIS — Z902 Acquired absence of lung [part of]: Secondary | ICD-10-CM | POA: Diagnosis not present

## 2023-10-28 DIAGNOSIS — C3412 Malignant neoplasm of upper lobe, left bronchus or lung: Secondary | ICD-10-CM

## 2023-10-28 DIAGNOSIS — R059 Cough, unspecified: Secondary | ICD-10-CM | POA: Diagnosis not present

## 2023-10-28 DIAGNOSIS — Z79630 Long term (current) use of alkylating agent: Secondary | ICD-10-CM | POA: Diagnosis not present

## 2023-10-28 DIAGNOSIS — Z5111 Encounter for antineoplastic chemotherapy: Secondary | ICD-10-CM | POA: Diagnosis present

## 2023-10-28 DIAGNOSIS — Z79899 Other long term (current) drug therapy: Secondary | ICD-10-CM | POA: Diagnosis not present

## 2023-10-28 LAB — CBC WITH DIFFERENTIAL (CANCER CENTER ONLY)
Abs Immature Granulocytes: 0.25 10*3/uL — ABNORMAL HIGH (ref 0.00–0.07)
Basophils Absolute: 0.1 10*3/uL (ref 0.0–0.1)
Basophils Relative: 1 %
Eosinophils Absolute: 0.1 10*3/uL (ref 0.0–0.5)
Eosinophils Relative: 1 %
HCT: 43 % (ref 39.0–52.0)
Hemoglobin: 14.1 g/dL (ref 13.0–17.0)
Immature Granulocytes: 2 %
Lymphocytes Relative: 18 %
Lymphs Abs: 2 10*3/uL (ref 0.7–4.0)
MCH: 29.7 pg (ref 26.0–34.0)
MCHC: 32.8 g/dL (ref 30.0–36.0)
MCV: 90.7 fL (ref 80.0–100.0)
Monocytes Absolute: 0.8 10*3/uL (ref 0.1–1.0)
Monocytes Relative: 7 %
Neutro Abs: 7.6 10*3/uL (ref 1.7–7.7)
Neutrophils Relative %: 71 %
Platelet Count: 452 10*3/uL — ABNORMAL HIGH (ref 150–400)
RBC: 4.74 MIL/uL (ref 4.22–5.81)
RDW: 14.2 % (ref 11.5–15.5)
WBC Count: 10.8 10*3/uL — ABNORMAL HIGH (ref 4.0–10.5)
nRBC: 0 % (ref 0.0–0.2)

## 2023-10-28 LAB — CMP (CANCER CENTER ONLY)
ALT: 16 U/L (ref 0–44)
AST: 26 U/L (ref 15–41)
Albumin: 4.3 g/dL (ref 3.5–5.0)
Alkaline Phosphatase: 100 U/L (ref 38–126)
Anion gap: 9 (ref 5–15)
BUN: 18 mg/dL (ref 8–23)
CO2: 29 mmol/L (ref 22–32)
Calcium: 9.5 mg/dL (ref 8.9–10.3)
Chloride: 100 mmol/L (ref 98–111)
Creatinine: 1.24 mg/dL (ref 0.61–1.24)
GFR, Estimated: >60 mL/min (ref >60)
Glucose, Bld: 95 mg/dL (ref 70–99)
Potassium: 4.3 mmol/L (ref 3.5–5.1)
Sodium: 138 mmol/L (ref 135–145)
Total Bilirubin: 0.3 mg/dL (ref 0.0–1.2)
Total Protein: 7.1 g/dL (ref 6.5–8.1)

## 2023-10-28 LAB — MAGNESIUM: Magnesium: 1.8 mg/dL (ref 1.7–2.4)

## 2023-10-28 MED ORDER — BENZONATATE 100 MG PO CAPS: 100.0000 mg | ORAL_CAPSULE | Freq: Three times a day (TID) | ORAL | 2 refills | Status: DC | PRN

## 2023-10-29 ENCOUNTER — Inpatient Hospital Stay: Payer: MEDICAID

## 2023-10-29 VITALS — BP 156/92 | HR 87 | Temp 98.2°F | Resp 18

## 2023-10-29 DIAGNOSIS — Z5111 Encounter for antineoplastic chemotherapy: Secondary | ICD-10-CM | POA: Diagnosis not present

## 2023-10-29 DIAGNOSIS — C3412 Malignant neoplasm of upper lobe, left bronchus or lung: Secondary | ICD-10-CM

## 2023-10-29 MED ORDER — SODIUM CHLORIDE 0.9 % IV SOLN
75.0000 mg/m2 | Freq: Once | INTRAVENOUS | Status: AC
  Administered 2023-10-29: 150 mg via INTRAVENOUS
  Filled 2023-10-29: qty 50

## 2023-10-29 MED ORDER — POTASSIUM CHLORIDE IN NACL 20-0.9 MEQ/L-% IV SOLN
Freq: Once | INTRAVENOUS | Status: AC
  Filled 2023-10-29: qty 1000

## 2023-10-29 MED ORDER — HEPARIN SOD (PORK) LOCK FLUSH 100 UNIT/ML IV SOLN: 500.0000 [IU] | Freq: Once | INTRAVENOUS | Status: DC | PRN

## 2023-10-29 MED ORDER — SODIUM CHLORIDE 0.9% FLUSH: 10.0000 mL | INTRAVENOUS | Status: DC | PRN

## 2023-10-29 MED ORDER — SODIUM CHLORIDE 0.9 % IV SOLN: INTRAVENOUS | Status: DC

## 2023-10-29 MED ORDER — SODIUM CHLORIDE 0.9 % IV SOLN
150.0000 mg | Freq: Once | INTRAVENOUS | Status: AC
  Administered 2023-10-29: 150 mg via INTRAVENOUS
  Filled 2023-10-29: qty 150

## 2023-10-29 MED ORDER — DEXAMETHASONE SODIUM PHOSPHATE 10 MG/ML IJ SOLN
10.0000 mg | Freq: Once | INTRAMUSCULAR | Status: AC
  Administered 2023-10-29: 10 mg via INTRAVENOUS
  Filled 2023-10-29: qty 1

## 2023-10-29 MED ORDER — PALONOSETRON HCL INJECTION 0.25 MG/5ML
0.2500 mg | Freq: Once | INTRAVENOUS | Status: AC
  Administered 2023-10-29: 0.25 mg via INTRAVENOUS
  Filled 2023-10-29: qty 5

## 2023-10-29 MED ORDER — MAGNESIUM SULFATE 2 GM/50ML IV SOLN
2.0000 g | Freq: Once | INTRAVENOUS | Status: AC
  Administered 2023-10-29: 2 g via INTRAVENOUS
  Filled 2023-10-29: qty 50

## 2023-10-29 MED ORDER — SODIUM CHLORIDE 0.9 % IV SOLN
500.0000 mg/m2 | Freq: Once | INTRAVENOUS | Status: AC
  Administered 2023-10-29: 1000 mg via INTRAVENOUS
  Filled 2023-10-29: qty 40

## 2023-10-29 NOTE — Patient Instructions (Signed)
 [facility] - A DEPT OF [facility]  Discharge Instructions: Thank you for choosing [facility] to provide your oncology and hematology care.   If you have a lab appointment with the Cancer Center, please go directly to the Cancer Center and check in at the registration area.   Wear comfortable clothing and clothing appropriate for easy access to any Portacath or PICC line.   We strive to give you quality time with your provider. You may need to reschedule your appointment if you arrive late (15 or more minutes).  Arriving late affects you and other patients whose appointments are after yours.  Also, if you miss three or more appointments without notifying the office, you may be dismissed from the clinic at the provider's discretion.      For prescription refill requests, have your pharmacy contact our office and allow 72 hours for refills to be completed.    Today you received the following chemotherapy and/or immunotherapy agents: pemetrexed , cisplatin        To help prevent nausea and vomiting after your treatment, we encourage you to take your nausea medication as directed.  BELOW ARE SYMPTOMS THAT SHOULD BE REPORTED IMMEDIATELY: *FEVER GREATER THAN 100.4 F (38 C) OR HIGHER *CHILLS OR SWEATING *NAUSEA AND VOMITING THAT IS NOT CONTROLLED WITH YOUR NAUSEA MEDICATION *UNUSUAL SHORTNESS OF BREATH *UNUSUAL BRUISING OR BLEEDING *URINARY PROBLEMS (pain or burning when urinating, or frequent urination) *BOWEL PROBLEMS (unusual diarrhea, constipation, pain near the anus) TENDERNESS IN MOUTH AND THROAT WITH OR WITHOUT PRESENCE OF ULCERS (sore throat, sores in mouth, or a toothache) UNUSUAL RASH, SWELLING OR PAIN  UNUSUAL VAGINAL DISCHARGE OR ITCHING   Items with * indicate a potential emergency and should be followed up as soon as possible or go to the Emergency Department if any problems should occur.  Please show the CHEMOTHERAPY ALERT CARD or  IMMUNOTHERAPY ALERT CARD at check-in to the Emergency Department and triage nurse.  Should you have questions after your visit or need to cancel or reschedule your appointment, please contact [facility] - A DEPT OF [facility]  Dept: [phone]  and follow the prompts.  Office hours are 8:00 a.m. to 4:30 p.m. Monday - Friday. Please note that voicemails left after 4:00 p.m. may not be returned until the following business day.  We are closed weekends and major holidays. You have access to a nurse at all times for urgent questions. Please call the main number to the clinic Dept: [phone] and follow the prompts.   For any non-urgent questions, you may also contact your provider using MyChart. We now offer e-Visits for anyone 55 and older to request care online for non-urgent symptoms. For details visit [email or web address].   Also download the MyChart app! Go to the app store, search MyChart, open the app, select [facility], and log in with your MyChart username and password.  Pemetrexed  Injection What is this medication? PEMETREXED  (PEM e TREX ed) treats some types of cancer. It works by slowing down the growth of cancer cells. This medicine may be used for other purposes; ask your health care provider or pharmacist if you have questions. COMMON BRAND NAME(S): Alimta , PEMFEXY, PEMRYDI RTU What should I tell my care team before I take this medication? They need to know if you have any of these conditions: Infection, such as chickenpox, cold sores, or herpes Kidney disease Low blood cell levels (white cells, red cells, and  platelets) Lung or breathing disease, such as asthma Radiation therapy An unusual or allergic reaction to pemetrexed , other medications, foods, dyes, or preservatives If you or your partner are pregnant or trying to get pregnant Breast-feeding How should I use this medication? This medication is injected into a vein. It is given by  your care team in a hospital or clinic setting. Talk to your care team about the use of this medication in children. Special care may be needed. Overdosage: If you think you have taken too much of this medicine contact a poison control center or emergency room at once. NOTE: This medicine is only for you. Do not share this medicine with others. What if I miss a dose? Keep appointments for follow-up doses. It is important not to miss your dose. Call your care team if you are unable to keep an appointment. What may interact with this medication? Do not take this medication with any of the following: Live virus vaccines This medication may also interact with the following: Ibuprofen  This list may not describe all possible interactions. Give your health care provider a list of all the medicines, herbs, non-prescription drugs, or dietary supplements you use. Also tell them if you smoke, drink alcohol, or use illegal drugs. Some items may interact with your medicine. What should I watch for while using this medication? Your condition will be monitored carefully while you are receiving this medication. This medication may make you feel generally unwell. This is not uncommon as chemotherapy can affect healthy cells as well as cancer cells. Report any side effects. Continue your course of treatment even though you feel ill unless your care team tells you to stop. This medication can cause serious side effects. To reduce the risk, your care team may give you other medications to take before receiving this one. Be sure to follow the directions from your care team. This medication can cause a rash or redness in areas of the body that have previously had radiation therapy. If you have had radiation therapy, tell your care team if you notice a rash in this area. This medication may increase your risk of getting an infection. Call your care team for advice if you get a fever, chills, sore throat, or other symptoms  of a cold or flu. Do not treat yourself. Try to avoid being around people who are sick. Be careful brushing or flossing your teeth or using a toothpick because you may get an infection or bleed more easily. If you have any dental work done, tell your dentist you are receiving this medication. Avoid taking medications that contain aspirin , acetaminophen , ibuprofen , naproxen, or ketoprofen unless instructed by your care team. These medications may hide a fever. Check with your care team if you have severe diarrhea, nausea, and vomiting, or if you sweat a lot. The loss of too much body fluid may make it dangerous for you to take this medication. Talk to your care team if you or your partner wish to become pregnant or think either of you might be pregnant. This medication can cause serious birth defects if taken during pregnancy and for 6 months after the last dose. A negative pregnancy test is required before starting this medication. A reliable form of contraception is recommended while taking this medication and for 6 months after the last dose. Talk to your care team about reliable forms of contraception. Do not father a child while taking this medication and for 3 months after the last dose. Use a  condom while having sex during this time period. Do not breastfeed while taking this medication and for 1 week after the last dose. This medication may cause infertility. Talk to your care team if you are concerned about your fertility. What side effects may I notice from receiving this medication? Side effects that you should report to your care team as soon as possible: Allergic reactions--skin rash, itching, hives, swelling of the face, lips, tongue, or throat Dry cough, shortness of breath or trouble breathing Infection--fever, chills, cough, sore throat, wounds that don't heal, pain or trouble when passing urine, general feeling of discomfort or being unwell Kidney injury--decrease in the amount of urine,  swelling of the ankles, hands, or feet Low red blood cell level--unusual weakness or fatigue, dizziness, headache, trouble breathing Redness, blistering, peeling, or loosening of the skin, including inside the mouth Unusual bruising or bleeding Side effects that usually do not require medical attention (report to your care team if they continue or are bothersome): Fatigue Loss of appetite Nausea Vomiting This list may not describe all possible side effects. Call your doctor for medical advice about side effects. You may report side effects to FDA at 1-800-FDA-1088. Where should I keep my medication? This medication is given in a hospital or clinic. It will not be stored at home. NOTE: This sheet is a summary. It may not cover all possible information. If you have questions about this medicine, talk to your doctor, pharmacist, or health care provider.  2024 Elsevier/Gold Standard (2022-01-14 00:00:00) Cisplatin  Injection What is this medication? CISPLATIN  (SIS pla tin) treats some types of cancer. It works by slowing down the growth of cancer cells. This medicine may be used for other purposes; ask your health care provider or pharmacist if you have questions. COMMON BRAND NAME(S): Platinol , Platinol  -AQ What should I tell my care team before I take this medication? They need to know if you have any of these conditions: Eye disease, vision problems Hearing problems Kidney disease Low blood counts, such as low white cells, platelets, or red blood cells Tingling of the fingers or toes, or other nerve disorder An unusual or allergic reaction to cisplatin , carboplatin, oxaliplatin, other medications, foods, dyes, or preservatives If you or your partner are pregnant or trying to get pregnant Breast-feeding How should I use this medication? This medication is injected into a vein. It is given by your care team in a hospital or clinic setting. Talk to your care team about the use of this  medication in children. Special care may be needed. Overdosage: If you think you have taken too much of this medicine contact a poison control center or emergency room at once. NOTE: This medicine is only for you. Do not share this medicine with others. What if I miss a dose? Keep appointments for follow-up doses. It is important not to miss your dose. Call your care team if you are unable to keep an appointment. What may interact with this medication? Do not take this medication with any of the following: Live virus vaccines This medication may also interact with the following: Certain antibiotics, such as amikacin, gentamicin, neomycin, polymyxin B, streptomycin, tobramycin, vancomycin Foscarnet This list may not describe all possible interactions. Give your health care provider a list of all the medicines, herbs, non-prescription drugs, or dietary supplements you use. Also tell them if you smoke, drink alcohol, or use illegal drugs. Some items may interact with your medicine. What should I watch for while using this medication? Your  condition will be monitored carefully while you are receiving this medication. You may need blood work done while taking this medication. This medication may make you feel generally unwell. This is not uncommon, as chemotherapy can affect healthy cells as well as cancer cells. Report any side effects. Continue your course of treatment even though you feel ill unless your care team tells you to stop. This medication may increase your risk of getting an infection. Call your care team for advice if you get a fever, chills, sore throat, or other symptoms of a cold or flu. Do not treat yourself. Try to avoid being around people who are sick. Avoid taking medications that contain aspirin , acetaminophen , ibuprofen , naproxen, or ketoprofen unless instructed by your care team. These medications may hide a fever. This medication may increase your risk to bruise or bleed. Call  your care team if you notice any unusual bleeding. Be careful brushing or flossing your teeth or using a toothpick because you may get an infection or bleed more easily. If you have any dental work done, tell your dentist you are receiving this medication. Drink fluids as directed while you are taking this medication. This will help protect your kidneys. Call your care team if you get diarrhea. Do not treat yourself. Talk to your care team if you or your partner wish to become pregnant or think you might be pregnant. This medication can cause serious birth defects if taken during pregnancy and for 14 months after the last dose. A negative pregnancy test is required before starting this medication. A reliable form of contraception is recommended while taking this medication and for 14 months after the last dose. Talk to your care team about effective forms of contraception. Do not father a child while taking this medication and for 11 months after the last dose. Use a condom during sex during this time period. Do not breast-feed while taking this medication. This medication may cause infertility. Talk to your care team if you are concerned about your fertility. What side effects may I notice from receiving this medication? Side effects that you should report to your care team as soon as possible: Allergic reactions--skin rash, itching, hives, swelling of the face, lips, tongue, or throat Eye pain, change in vision, vision loss Hearing loss, ringing in ears Infection--fever, chills, cough, sore throat, wounds that don't heal, pain or trouble when passing urine, general feeling of discomfort or being unwell Kidney injury--decrease in the amount of urine, swelling of the ankles, hands, or feet Low red blood cell level--unusual weakness or fatigue, dizziness, headache, trouble breathing Painful swelling, warmth, or redness of the skin, blisters or sores at the infusion site Pain, tingling, or numbness in  the hands or feet Unusual bruising or bleeding Side effects that usually do not require medical attention (report to your care team if they continue or are bothersome): Hair loss Nausea Vomiting This list may not describe all possible side effects. Call your doctor for medical advice about side effects. You may report side effects to FDA at 1-800-FDA-1088. Where should I keep my medication? This medication is given in a hospital or clinic. It will not be stored at home. NOTE: This sheet is a summary. It may not cover all possible information. If you have questions about this medicine, talk to your doctor, pharmacist, or health care provider.  2024 Elsevier/Gold Standard (2022-01-10 00:00:00)

## 2023-11-03 ENCOUNTER — Ambulatory Visit: Payer: MEDICAID | Admitting: Physician Assistant

## 2023-11-03 ENCOUNTER — Other Ambulatory Visit: Payer: MEDICAID

## 2023-11-03 ENCOUNTER — Inpatient Hospital Stay: Payer: MEDICAID

## 2023-11-07 NOTE — Addendum Note (Signed)
Encounter addended by: Laqueta Due, PA-C on: 11/07/2023 7:25 PM  Actions taken: Delete clinical note

## 2023-11-10 ENCOUNTER — Other Ambulatory Visit: Payer: Self-pay

## 2023-11-10 ENCOUNTER — Inpatient Hospital Stay: Payer: MEDICAID

## 2023-11-10 DIAGNOSIS — C3412 Malignant neoplasm of upper lobe, left bronchus or lung: Secondary | ICD-10-CM

## 2023-11-10 DIAGNOSIS — Z5111 Encounter for antineoplastic chemotherapy: Secondary | ICD-10-CM | POA: Diagnosis not present

## 2023-11-10 LAB — CMP (CANCER CENTER ONLY)
ALT: 8 U/L (ref 0–44)
AST: 12 U/L — ABNORMAL LOW (ref 15–41)
Albumin: 3.9 g/dL (ref 3.5–5.0)
Alkaline Phosphatase: 94 U/L (ref 38–126)
Anion gap: 8 (ref 5–15)
BUN: 13 mg/dL (ref 8–23)
CO2: 31 mmol/L (ref 22–32)
Calcium: 9.1 mg/dL (ref 8.9–10.3)
Chloride: 98 mmol/L (ref 98–111)
Creatinine: 1.01 mg/dL (ref 0.61–1.24)
GFR, Estimated: >60 mL/min (ref >60)
Glucose, Bld: 100 mg/dL — ABNORMAL HIGH (ref 70–99)
Potassium: 3.2 mmol/L — ABNORMAL LOW (ref 3.5–5.1)
Sodium: 137 mmol/L (ref 135–145)
Total Bilirubin: 0.7 mg/dL (ref 0.0–1.2)
Total Protein: 6.3 g/dL — ABNORMAL LOW (ref 6.5–8.1)

## 2023-11-10 LAB — CBC WITH DIFFERENTIAL (CANCER CENTER ONLY)
Abs Immature Granulocytes: 0.04 10*3/uL (ref 0.00–0.07)
Basophils Absolute: 0 10*3/uL (ref 0.0–0.1)
Basophils Relative: 0 %
Eosinophils Absolute: 0.1 10*3/uL (ref 0.0–0.5)
Eosinophils Relative: 1 %
HCT: 40.2 % (ref 39.0–52.0)
Hemoglobin: 13.5 g/dL (ref 13.0–17.0)
Immature Granulocytes: 1 %
Lymphocytes Relative: 24 %
Lymphs Abs: 2.1 10*3/uL (ref 0.7–4.0)
MCH: 29.5 pg (ref 26.0–34.0)
MCHC: 33.6 g/dL (ref 30.0–36.0)
MCV: 87.8 fL (ref 80.0–100.0)
Monocytes Absolute: 1.2 10*3/uL — ABNORMAL HIGH (ref 0.1–1.0)
Monocytes Relative: 14 %
Neutro Abs: 5.3 10*3/uL (ref 1.7–7.7)
Neutrophils Relative %: 60 %
Platelet Count: 139 10*3/uL — ABNORMAL LOW (ref 150–400)
RBC: 4.58 MIL/uL (ref 4.22–5.81)
RDW: 14 % (ref 11.5–15.5)
Smear Review: NORMAL
WBC Count: 8.7 10*3/uL (ref 4.0–10.5)
nRBC: 0 % (ref 0.0–0.2)

## 2023-11-10 LAB — MAGNESIUM: Magnesium: 1.8 mg/dL (ref 1.7–2.4)

## 2023-11-14 ENCOUNTER — Other Ambulatory Visit: Payer: Self-pay

## 2023-11-14 NOTE — Progress Notes (Signed)
 Updated pemetrexed to new ERX: [id number] - 500 mg/m2  Pryor Ochoa, PharmD 11/14/23 @ [phone]

## 2023-11-16 MED FILL — Fosaprepitant Dimeglumine For IV Infusion 150 MG (Base Eq): INTRAVENOUS | Qty: 5 | Status: AC

## 2023-11-17 ENCOUNTER — Inpatient Hospital Stay: Payer: MEDICAID

## 2023-11-17 ENCOUNTER — Inpatient Hospital Stay (HOSPITAL_BASED_OUTPATIENT_CLINIC_OR_DEPARTMENT_OTHER): Payer: MEDICAID | Admitting: Internal Medicine

## 2023-11-17 VITALS — BP 120/92 | HR 90 | Resp 18

## 2023-11-17 VITALS — BP 127/90 | HR 96 | Temp 97.5°F | Resp 17 | Ht 72.0 in | Wt 157.7 lb

## 2023-11-17 DIAGNOSIS — C3412 Malignant neoplasm of upper lobe, left bronchus or lung: Secondary | ICD-10-CM | POA: Diagnosis not present

## 2023-11-17 DIAGNOSIS — Z5111 Encounter for antineoplastic chemotherapy: Secondary | ICD-10-CM | POA: Diagnosis not present

## 2023-11-17 LAB — CBC WITH DIFFERENTIAL (CANCER CENTER ONLY)
Abs Immature Granulocytes: 0.02 10*3/uL (ref 0.00–0.07)
Basophils Absolute: 0 10*3/uL (ref 0.0–0.1)
Basophils Relative: 0 %
Eosinophils Absolute: 0 10*3/uL (ref 0.0–0.5)
Eosinophils Relative: 0 %
HCT: 38.7 % — ABNORMAL LOW (ref 39.0–52.0)
Hemoglobin: 13.1 g/dL (ref 13.0–17.0)
Immature Granulocytes: 0 %
Lymphocytes Relative: 47 %
Lymphs Abs: 2.3 10*3/uL (ref 0.7–4.0)
MCH: 29.4 pg (ref 26.0–34.0)
MCHC: 33.9 g/dL (ref 30.0–36.0)
MCV: 87 fL (ref 80.0–100.0)
Monocytes Absolute: 0.6 10*3/uL (ref 0.1–1.0)
Monocytes Relative: 12 %
Neutro Abs: 2 10*3/uL (ref 1.7–7.7)
Neutrophils Relative %: 41 %
Platelet Count: 304 10*3/uL (ref 150–400)
RBC: 4.45 MIL/uL (ref 4.22–5.81)
RDW: 14.8 % (ref 11.5–15.5)
WBC Count: 4.9 10*3/uL (ref 4.0–10.5)
nRBC: 0 % (ref 0.0–0.2)

## 2023-11-17 LAB — MAGNESIUM: Magnesium: 1.9 mg/dL (ref 1.7–2.4)

## 2023-11-17 LAB — CMP (CANCER CENTER ONLY)
ALT: 7 U/L (ref 0–44)
AST: 17 U/L (ref 15–41)
Albumin: 4 g/dL (ref 3.5–5.0)
Alkaline Phosphatase: 80 U/L (ref 38–126)
Anion gap: 10 (ref 5–15)
BUN: 17 mg/dL (ref 8–23)
CO2: 26 mmol/L (ref 22–32)
Calcium: 9.1 mg/dL (ref 8.9–10.3)
Chloride: 98 mmol/L (ref 98–111)
Creatinine: 0.98 mg/dL (ref 0.61–1.24)
GFR, Estimated: >60 mL/min (ref >60)
Glucose, Bld: 104 mg/dL — ABNORMAL HIGH (ref 70–99)
Potassium: 3.6 mmol/L (ref 3.5–5.1)
Sodium: 134 mmol/L — ABNORMAL LOW (ref 135–145)
Total Bilirubin: 0.4 mg/dL (ref 0.0–1.2)
Total Protein: 7 g/dL (ref 6.5–8.1)

## 2023-11-17 MED ORDER — DEXAMETHASONE SODIUM PHOSPHATE 10 MG/ML IJ SOLN
10.0000 mg | Freq: Once | INTRAMUSCULAR | Status: AC
  Administered 2023-11-17: 10 mg via INTRAVENOUS
  Filled 2023-11-17: qty 1

## 2023-11-17 MED ORDER — SODIUM CHLORIDE 0.9 % IV SOLN: INTRAVENOUS | Status: DC

## 2023-11-17 MED ORDER — SODIUM CHLORIDE 0.9 % IV SOLN
75.0000 mg/m2 | Freq: Once | INTRAVENOUS | Status: AC
  Administered 2023-11-17: 150 mg via INTRAVENOUS
  Filled 2023-11-17: qty 150

## 2023-11-17 MED ORDER — POTASSIUM CHLORIDE IN NACL 20-0.9 MEQ/L-% IV SOLN
Freq: Once | INTRAVENOUS | Status: AC
  Filled 2023-11-17: qty 1000

## 2023-11-17 MED ORDER — MAGNESIUM SULFATE 2 GM/50ML IV SOLN
2.0000 g | Freq: Once | INTRAVENOUS | Status: AC
  Administered 2023-11-17: 2 g via INTRAVENOUS
  Filled 2023-11-17: qty 50

## 2023-11-17 MED ORDER — CYANOCOBALAMIN 1000 MCG/ML IJ SOLN
1000.0000 ug | Freq: Once | INTRAMUSCULAR | Status: AC
  Administered 2023-11-17: 1000 ug via INTRAMUSCULAR
  Filled 2023-11-17: qty 1

## 2023-11-17 MED ORDER — SODIUM CHLORIDE 0.9 % IV SOLN
150.0000 mg | Freq: Once | INTRAVENOUS | Status: AC
  Administered 2023-11-17: 150 mg via INTRAVENOUS
  Filled 2023-11-17: qty 150

## 2023-11-17 MED ORDER — PALONOSETRON HCL INJECTION 0.25 MG/5ML
0.2500 mg | Freq: Once | INTRAVENOUS | Status: AC
  Administered 2023-11-17: 0.25 mg via INTRAVENOUS
  Filled 2023-11-17: qty 5

## 2023-11-17 MED ORDER — SODIUM CHLORIDE 0.9 % IV SOLN
500.0000 mg/m2 | Freq: Once | INTRAVENOUS | Status: AC
  Administered 2023-11-17: 1000 mg via INTRAVENOUS
  Filled 2023-11-17: qty 40

## 2023-11-17 NOTE — Progress Notes (Signed)
 [name] [phone]   [phone]  OFFICE PROGRESS NOTE  [name], MD [address] [place] [address]  DIAGNOSIS: Stage IIb (T3, N0, M0) non-small cell lung cancer, adenocarcinoma diagnosed in October 2024  PRIOR THERAPY: Status post left upper lobectomy with lymph node dissection under the care of Dr. Dorris Fetch on July 23, 2023.  The tumor measured 1.3 cm with extension into the attached parietal pleural adipose tissue.  CURRENT THERAPY: Adjuvant systemic chemotherapy with cisplatin 75 Mg/M2 and Alimta 500 Mg/M2 every 3 weeks.  First dose October 05, 2022.  Status post 2 cycles.  INTERVAL HISTORY: [name] 63 y.o. male returns to the clinic today for follow-up visit accompanied by his wife.Discussed the use of AI scribe software for clinical note transcription with the patient, who gave verbal consent to proceed.  History of Present Illness   [name] is a 63 year old male with non-small cell lung cancer who presents for chemotherapy treatment.  He is undergoing treatment for non-small cell lung cancer and had a left upper lobectomy performed in October 2024. He is currently receiving adjuvant chemotherapy with cisplatin and pemetrexed every three weeks, and today marks his third session of chemotherapy.  He has a persistent cough that has not improved with Tessalon (benzonatate). He notes that the medication was effective for his mother in the past, but it has not provided relief for him.  No nausea, vomiting, or diarrhea, but he reports a lack of appetite and some weight loss. His weight has decreased from 167 pounds to 157.7 pounds. He mentions that the duration of his symptoms seems to last longer, but he feels better once the symptoms subside.  He reports soreness in his rib area, which he attributes to the robotic arm used during his surgery. He describes the rib as still being sore but expects it  to improve over time.  He is currently taking folic acid, a steroid, and prochlorperazine (Compazine) for nausea as part of his treatment regimen.        MEDICAL HISTORY: Past Medical History:  Diagnosis Date   Alcoholic (HCC)    Bipolar 1 disorder (HCC)    Depression    Diabetes mellitus without complication (HCC)    Per pt he is pre-diabetic   Homeless    Schizoaffective schizophrenia (HCC)     ALLERGIES:  has no known allergies.  MEDICATIONS:  Current Outpatient Medications  Medication Sig Dispense Refill   acetaminophen (TYLENOL) 500 MG tablet Take 500-1,000 mg by mouth every 6 (six) hours as needed for moderate pain (pain score 4-6).     benzonatate (TESSALON) 100 MG capsule Take 1 capsule (100 mg total) by mouth 3 (three) times daily as needed. 90 capsule 2   cetirizine (ZYRTEC) 10 MG tablet Take 10 mg by mouth daily as needed for allergies.     dexamethasone (DECADRON) 4 MG tablet Take 1 tab 2 times daily starting day before pemetrexed. Then take 2 tabs daily x 3 days starting day after cisplatin. Take with food. 30 tablet 1   folic acid (FOLVITE) 1 MG tablet Take 1 tablet (1 mg total) by mouth daily. Start 7 days before pemetrexed chemotherapy. Continue until 21 days after pemetrexed completed. 100 tablet 3   gabapentin (NEURONTIN) 300 MG capsule Take 1 capsule (300 mg total) by mouth 2 (two) times daily. (Patient not taking: Reported on 09/08/2023) 60 capsule 0   ibuprofen (ADVIL) 200 MG tablet  Take 400 mg by mouth every 6 (six) hours as needed for moderate pain (pain score 4-6).     indomethacin (INDOCIN) 50 MG capsule Take 1 capsule (50 mg total) by mouth 3 (three) times daily with meals. (Patient taking differently: Take 50 mg by mouth 3 (three) times daily as needed (gout).) 50 capsule 2   methocarbamol (ROBAXIN) 500 MG tablet Take 1 tablet (500 mg total) by mouth every 6 (six) hours as needed for muscle spasms. (Patient not taking: Reported on 09/08/2023) 28 tablet 0    prochlorperazine (COMPAZINE) 10 MG tablet Take 1 tablet (10 mg total) by mouth every 6 (six) hours as needed for nausea or vomiting. 30 tablet 1   No current facility-administered medications for this visit.    SURGICAL HISTORY:  Past Surgical History:  Procedure Laterality Date   COLONOSCOPY  07/16/2023   DENTAL SURGERY     wisdom teeth   INTERCOSTAL NERVE BLOCK Left 07/23/2023   Procedure: INTERCOSTAL NERVE BLOCK;  Surgeon: [name], MD;  Location: [facility] OR;  Service: Thoracic;  Laterality: Left;   LYMPH NODE BIOPSY Left 07/23/2023   Procedure: LYMPH NODE BIOPSY;  Surgeon: [name], MD;  Location: [facility] OR;  Service: Thoracic;  Laterality: Left;    REVIEW OF SYSTEMS:  A comprehensive review of systems was negative except for: Constitutional: positive for weight loss Respiratory: positive for cough and pleurisy/chest pain   PHYSICAL EXAMINATION: General appearance: alert, cooperative, and no distress Head: Normocephalic, without obvious abnormality, atraumatic Neck: no adenopathy, no JVD, supple, symmetrical, trachea midline, and thyroid not enlarged, symmetric, no tenderness/mass/nodules Lymph nodes: Cervical, supraclavicular, and axillary nodes normal. Resp: clear to auscultation bilaterally Back: symmetric, no curvature. ROM normal. No CVA tenderness. Cardio: regular rate and rhythm, S1, S2 normal, no murmur, click, rub or gallop GI: soft, non-tender; bowel sounds normal; no masses,  no organomegaly Extremities: extremities normal, atraumatic, no cyanosis or edema  ECOG PERFORMANCE STATUS: 1 - Symptomatic but completely ambulatory  Blood pressure (!) 127/90, pulse 96, temperature (!) 97.5 F (36.4 C), temperature source Temporal, resp. rate 17, height 6' (1.829 m), weight 157 lb 11.2 oz (71.5 kg), SpO2 100%.  LABORATORY DATA: Lab Results  Component Value Date   WBC 4.9 11/17/2023   HGB 13.1 11/17/2023   HCT 38.7 (L) 11/17/2023   MCV 87.0 11/17/2023    PLT 304 11/17/2023      Chemistry      Component Value Date/Time   NA 137 11/10/2023 1256   K 3.2 (L) 11/10/2023 1256   CL 98 11/10/2023 1256   CO2 31 11/10/2023 1256   BUN 13 11/10/2023 1256   CREATININE 1.01 11/10/2023 1256   CREATININE 1.09 03/24/2023 0000      Component Value Date/Time   CALCIUM 9.1 11/10/2023 1256   ALKPHOS 94 11/10/2023 1256   AST 12 (L) 11/10/2023 1256   ALT 8 11/10/2023 1256   BILITOT 0.7 11/10/2023 1256       RADIOGRAPHIC STUDIES: No results found.   ASSESSMENT AND PLAN: This is a very pleasant 63 years old white male with Stage IIb (T3, N0, M0) non-small cell lung cancer, adenocarcinoma diagnosed in October 2024 The patient is status post left upper lobectomy with lymph node dissection under the care of Dr. Dorris Fetch on July 23, 2023.  The tumor measured 1.3 cm with extension into the attached parietal pleural adipose tissue. He is currently undergoing adjuvant systemic chemotherapy with cisplatin 75 Mg/M2 and Alimta 500 Mg/M2 every 3  weeks.  First dose October 05, 2022 status post 2 cycles.    Non-Small Cell Lung Cancer (NSCLC) 63 year old male, status post left upper lobectomy (October 2024), currently on third cycle of adjuvant chemotherapy with cisplatin and pemetrexed. Reports persistent cough unresponsive to benzonatate, decreased appetite, and weight loss (157.7 lbs from 167 lbs). No nausea, vomiting, or diarrhea. Labs within acceptable limits for treatment continuation. Discussed potential for further weight loss and fatigue, emphasizing the importance of completing chemotherapy for remission. - Administer third cycle of cisplatin and pemetrexed - Continue folic acid, steroid, and compazine for nausea - Schedule follow-up in three weeks for final chemotherapy cycle  Chronic Cough Persistent cough unresponsive to benzonatate, expected to take more time to resolve. - Reassess if symptoms persist or worsen  Post-Surgical Rib Pain Ongoing  rib soreness likely related to robotic arm used during lobectomy, expected to improve over time. - Provide supportive care as needed  Follow-up - Follow-up appointment in three weeks for final chemotherapy cycle.   The patient was advised to call immediately if he has any concerning symptoms in the interval. The patient voices understanding of current disease status and treatment options and is in agreement with the current care plan.  All questions were answered. The patient knows to call the clinic with any problems, questions or concerns. We can certainly see the patient much sooner if necessary.  The total time spent in the appointment was 20 minutes.  Disclaimer: This note was dictated with voice recognition software. Similar sounding words can inadvertently be transcribed and may not be corrected upon review.

## 2023-11-23 ENCOUNTER — Inpatient Hospital Stay: Payer: MEDICAID | Attending: Internal Medicine

## 2023-11-23 ENCOUNTER — Inpatient Hospital Stay (HOSPITAL_BASED_OUTPATIENT_CLINIC_OR_DEPARTMENT_OTHER): Payer: MEDICAID | Admitting: Physician Assistant

## 2023-11-23 ENCOUNTER — Inpatient Hospital Stay: Payer: MEDICAID

## 2023-11-23 ENCOUNTER — Other Ambulatory Visit: Payer: Self-pay

## 2023-11-23 ENCOUNTER — Telehealth: Payer: Self-pay | Admitting: Medical Oncology

## 2023-11-23 VITALS — BP 129/91 | HR 98 | Temp 98.3°F | Resp 22 | Wt 151.0 lb

## 2023-11-23 DIAGNOSIS — R110 Nausea: Secondary | ICD-10-CM | POA: Insufficient documentation

## 2023-11-23 DIAGNOSIS — Z79631 Long term (current) use of antimetabolite agent: Secondary | ICD-10-CM | POA: Diagnosis not present

## 2023-11-23 DIAGNOSIS — C3412 Malignant neoplasm of upper lobe, left bronchus or lung: Secondary | ICD-10-CM

## 2023-11-23 DIAGNOSIS — Z79630 Long term (current) use of alkylating agent: Secondary | ICD-10-CM | POA: Diagnosis not present

## 2023-11-23 DIAGNOSIS — R519 Headache, unspecified: Secondary | ICD-10-CM

## 2023-11-23 DIAGNOSIS — E871 Hypo-osmolality and hyponatremia: Secondary | ICD-10-CM | POA: Diagnosis present

## 2023-11-23 DIAGNOSIS — Z5111 Encounter for antineoplastic chemotherapy: Secondary | ICD-10-CM | POA: Insufficient documentation

## 2023-11-23 DIAGNOSIS — R630 Anorexia: Secondary | ICD-10-CM | POA: Insufficient documentation

## 2023-11-23 LAB — CMP (CANCER CENTER ONLY)
ALT: 29 U/L (ref 0–44)
AST: 28 U/L (ref 15–41)
Albumin: 4.1 g/dL (ref 3.5–5.0)
Alkaline Phosphatase: 94 U/L (ref 38–126)
Anion gap: 13 (ref 5–15)
BUN: 25 mg/dL — ABNORMAL HIGH (ref 8–23)
CO2: 23 mmol/L (ref 22–32)
Calcium: 9.2 mg/dL (ref 8.9–10.3)
Chloride: 93 mmol/L — ABNORMAL LOW (ref 98–111)
Creatinine: 1.19 mg/dL (ref 0.61–1.24)
GFR, Estimated: >60 mL/min (ref >60)
Glucose, Bld: 118 mg/dL — ABNORMAL HIGH (ref 70–99)
Potassium: 3.6 mmol/L (ref 3.5–5.1)
Sodium: 129 mmol/L — ABNORMAL LOW (ref 135–145)
Total Bilirubin: 0.9 mg/dL (ref 0.0–1.2)
Total Protein: 6.9 g/dL (ref 6.5–8.1)

## 2023-11-23 LAB — CBC WITH DIFFERENTIAL (CANCER CENTER ONLY)
Abs Immature Granulocytes: 0.02 10*3/uL (ref 0.00–0.07)
Basophils Absolute: 0 10*3/uL (ref 0.0–0.1)
Basophils Relative: 0 %
Eosinophils Absolute: 0 10*3/uL (ref 0.0–0.5)
Eosinophils Relative: 1 %
HCT: 42 % (ref 39.0–52.0)
Hemoglobin: 14.7 g/dL (ref 13.0–17.0)
Immature Granulocytes: 0 %
Lymphocytes Relative: 20 %
Lymphs Abs: 1.3 10*3/uL (ref 0.7–4.0)
MCH: 29 pg (ref 26.0–34.0)
MCHC: 35 g/dL (ref 30.0–36.0)
MCV: 82.8 fL (ref 80.0–100.0)
Monocytes Absolute: 0.2 10*3/uL (ref 0.1–1.0)
Monocytes Relative: 4 %
Neutro Abs: 4.7 10*3/uL (ref 1.7–7.7)
Neutrophils Relative %: 75 %
Platelet Count: 362 10*3/uL (ref 150–400)
RBC: 5.07 MIL/uL (ref 4.22–5.81)
RDW: 14.6 % (ref 11.5–15.5)
WBC Count: 6.3 10*3/uL (ref 4.0–10.5)
nRBC: 0 % (ref 0.0–0.2)

## 2023-11-23 LAB — MAGNESIUM: Magnesium: 1.7 mg/dL (ref 1.7–2.4)

## 2023-11-23 MED ORDER — SODIUM CHLORIDE 0.9 % IV SOLN: Freq: Once | INTRAVENOUS | Status: DC

## 2023-11-23 MED ORDER — ACETAMINOPHEN 325 MG PO TABS
650.0000 mg | ORAL_TABLET | Freq: Once | ORAL | Status: AC
  Administered 2023-11-23: 650 mg via ORAL
  Filled 2023-11-23: qty 2

## 2023-11-23 MED ORDER — SODIUM CHLORIDE 0.9 % IV SOLN: Freq: Once | INTRAVENOUS | Status: AC

## 2023-11-23 MED ORDER — FAMOTIDINE IN NACL 20-0.9 MG/50ML-% IV SOLN
20.0000 mg | Freq: Once | INTRAVENOUS | Status: AC
  Administered 2023-11-23: 20 mg via INTRAVENOUS
  Filled 2023-11-23: qty 50

## 2023-11-23 MED ORDER — ONDANSETRON HCL 4 MG/2ML IJ SOLN
8.0000 mg | Freq: Once | INTRAMUSCULAR | Status: AC
  Administered 2023-11-23: 8 mg via INTRAVENOUS
  Filled 2023-11-23: qty 4

## 2023-11-23 MED ORDER — DIPHENHYDRAMINE HCL 25 MG PO CAPS
25.0000 mg | ORAL_CAPSULE | Freq: Once | ORAL | Status: AC
  Administered 2023-11-23: 25 mg via ORAL
  Filled 2023-11-23: qty 1

## 2023-11-23 NOTE — Patient Instructions (Signed)

## 2023-11-23 NOTE — Telephone Encounter (Signed)
 [name] called and is concerned -and reported taht [name] is weak, cold, not drinking adequate fluids, in bed all weekend ,does get up to bathroom. temp now per [name]= 95-not sure of accuracy. MEesage sent to [facility].

## 2023-11-23 NOTE — Addendum Note (Signed)
 Addended by: Vista Deck E on: 11/23/2023 03:03 PM   Modules accepted: Orders

## 2023-11-23 NOTE — Progress Notes (Unsigned)
 Symptom Management Consult Note [facility]    Patient Care Team: [name], MD as PCP - General (Internal Medicine)    Name / MRN / DOB: [name]  [id number]  [birth year]   Date of visit: 11/23/2023   Chief Complaint/Reason for visit: generalized weakness, decreased fluid intake   Current Therapy: Cisplatin and Alimta  Last treatment:  Day 1   Cycle 3 on 11/17/23   ASSESSMENT & PLAN: Patient is a 63 y.o. male with oncologic history of stage IIb (T3, N0, M0) non-small cell lung cancer, adenocarcinoma followed by Dr. Arbutus Ped.  I have viewed most recent oncology note and lab work.    #Stage IIb (T3, N0, M0) non-small cell lung cancer, adenocarcinoma  - Next appointment with oncologist is 12/08/23  #Generalized weakness, decrease fluid intake  - Loss of appetite and fatigue AE of Alimta. - CBC unremarkable. CMP with moderate hyponatremia 129. Patient received 1L NS in clinic along with IV zofran and pepcid. Headache and fatigue have been persistent x several weeks, indicating etiology is likely multi-factorial  -On reassessment tachycardia has resolved and he admits to feeling better. Tolerating PO intake. Discussed how to take anti emetics at home. Also encouraged to include salty foods in diet.  #Headache -Chronic per patient and spouse -Normal neuro exam. Headache improved with tylenol and benadryl. Discussed further work up with PCP vs neurology. Patient prefers to start with PCP. -Chart review shows head CT on 08/24/23 was negative for metastatic disease. - Discussed with patient and spouse if headaches worsen/no improvement with OTC interventions then he should RTC for repeat evaluation and will consider imaging.  Strict ED precautions discussed should symptoms worsen.   Heme/Onc History: Oncology History  Adenocarcinoma of upper lobe of left lung (HCC)  09/29/2023 Initial Diagnosis   Adenocarcinoma of upper lobe of left lung (HCC)    09/29/2023 Cancer Staging   Staging form: Lung, AJCC V9 - Clinical: Stage IIB (cT3, cN0, cM0) - Signed by Si Gaul, MD on 09/29/2023   10/06/2023 -  Chemotherapy   Patient is on Treatment Plan : LUNG NSCLC Pemetrexed + Cisplatin q21d x 4 cycles         Interval history-: Discussed the use of AI scribe software for clinical note transcription with the patient, who gave verbal consent to proceed.  [name] is a 63 y.o. male with oncologic history as above presenting to [facility] today with chief complaint of generalized weakness and decreased fluid intake. Patient is accompanied by spouse who provides additional history.  Spouse reports symptoms started two days after his recent treatment. He has been experiencing poor oral intake, loss of appetite, and nausea. He denies vomiting but has only consumed minimal food, including a few bites of toast and a sip of a strawberry milkshake. Fluid intake has been minimal, with only a couple of bottles of water over the weekend. He dislikes the taste of water and is tired of the sweet taste of Powerade. He has been taking Prochlorperazine for nausea, with doses last night and this morning. He has experienced weight loss and loss of appetite during previous treatments, which improved before the third treatment but has worsened again. No constipation, diarrhea, or fever reported.  He has a history of headaches and has one currently. It started this morning. Headache has gradually worsened since onset. He denies visual changes or light sensitivity.He took a 500 mg tylenol at 8 am this morning. His wife also notes increased  fatigue, consistent with previous chemotherapy treatments. He has not seen PCP to discuss headaches but is planning on it. They were trying to get his cancer treatments underway before adding more doctors appointments.     ROS  All other systems are reviewed and are negative for acute change except as noted in the HPI.    No  Known Allergies   Past Medical History:  Diagnosis Date   Alcoholic (HCC)    Bipolar 1 disorder (HCC)    Depression    Diabetes mellitus without complication (HCC)    Per pt he is pre-diabetic   Homeless    Schizoaffective schizophrenia [facility])      Past Surgical History:  Procedure Laterality Date   COLONOSCOPY  07/16/2023   DENTAL SURGERY     wisdom teeth   INTERCOSTAL NERVE BLOCK Left 07/23/2023   Procedure: INTERCOSTAL NERVE BLOCK;  Surgeon: [name], MD;  Location: [facility] OR;  Service: Thoracic;  Laterality: Left;   LYMPH NODE BIOPSY Left 07/23/2023   Procedure: LYMPH NODE BIOPSY;  Surgeon: [name], MD;  Location: [facility] OR;  Service: Thoracic;  Laterality: Left;    Social History   Socioeconomic History   Marital status: Single    Spouse name: Not on file   Number of children: Not on file   Years of education: Not on file   Highest education level: Not on file  Occupational History   Not on file  Tobacco Use   Smoking status: Every Day    Current packs/day: 1.00    Average packs/day: 1 pack/day for 47.2 years (47.2 ttl pk-yrs)    Types: Cigarettes    Start date: 1978   Smokeless tobacco: Never   Tobacco comments:    7 packs of cigarettes in a week. 05/13/23 Tay  Vaping Use   Vaping status: Never Used  Substance and Sexual Activity   Alcohol use: Yes   Drug use: No   Sexual activity: Not on file  Other Topics Concern   Not on file  Social History Narrative   Not on file   Social Drivers of Health   Financial Resource Strain: Not on file  Food Insecurity: No Food Insecurity (07/23/2023)   Hunger Vital Sign    Worried About Running Out of Food in the Last Year: Never true    Ran Out of Food in the Last Year: Never true  Transportation Needs: No Transportation Needs (07/23/2023)   PRAPARE - Administrator, Civil Service (Medical): No    Lack of Transportation (Non-Medical): No  Physical Activity: Not on file  Stress: Not  on file  Social Connections: Unknown (04/23/2023)   Received from [facility]   Social Network    Social Network: Not on file  Intimate Partner Violence: Unknown (07/23/2023)   Humiliation, Afraid, Rape, and Kick questionnaire    Fear of Current or Ex-Partner: Patient unable to answer    Emotionally Abused: No    Physically Abused: No    Sexually Abused: No    Family History  Problem Relation Age of Onset   Colon cancer Neg Hx    Rectal cancer Neg Hx    Stomach cancer Neg Hx      Current Outpatient Medications:    acetaminophen (TYLENOL) 500 MG tablet, Take 500-1,000 mg by mouth every 6 (six) hours as needed for moderate pain (pain score 4-6)., Disp: , Rfl:    benzonatate (TESSALON) 100 MG capsule, Take 1 capsule (100 mg  total) by mouth 3 (three) times daily as needed., Disp: 90 capsule, Rfl: 2   cetirizine (ZYRTEC) 10 MG tablet, Take 10 mg by mouth daily as needed for allergies., Disp: , Rfl:    dexamethasone (DECADRON) 4 MG tablet, Take 1 tab 2 times daily starting day before pemetrexed. Then take 2 tabs daily x 3 days starting day after cisplatin. Take with food., Disp: 30 tablet, Rfl: 1   folic acid (FOLVITE) 1 MG tablet, Take 1 tablet (1 mg total) by mouth daily. Start 7 days before pemetrexed chemotherapy. Continue until 21 days after pemetrexed completed., Disp: 100 tablet, Rfl: 3   gabapentin (NEURONTIN) 300 MG capsule, Take 1 capsule (300 mg total) by mouth 2 (two) times daily. (Patient not taking: Reported on 09/08/2023), Disp: 60 capsule, Rfl: 0   ibuprofen (ADVIL) 200 MG tablet, Take 400 mg by mouth every 6 (six) hours as needed for moderate pain (pain score 4-6)., Disp: , Rfl:    indomethacin (INDOCIN) 50 MG capsule, Take 1 capsule (50 mg total) by mouth 3 (three) times daily with meals. (Patient taking differently: Take 50 mg by mouth 3 (three) times daily as needed (gout).), Disp: 50 capsule, Rfl: 2   methocarbamol (ROBAXIN) 500 MG tablet, Take 1 tablet (500 mg total) by  mouth every 6 (six) hours as needed for muscle spasms. (Patient not taking: Reported on 09/08/2023), Disp: 28 tablet, Rfl: 0   prochlorperazine (COMPAZINE) 10 MG tablet, Take 1 tablet (10 mg total) by mouth every 6 (six) hours as needed for nausea or vomiting., Disp: 30 tablet, Rfl: 1  PHYSICAL EXAM: ECOG FS:1 - Symptomatic but completely ambulatory    Vitals:   11/23/23 1100 11/23/23 1228 11/23/23 1334  BP: (!) 120/97  (!) 129/91  Pulse: (!) 128 92 98  Resp: (!) 24 (!) 22 (!) 22  Temp: 98.3 F (36.8 C)    TempSrc: Oral    SpO2: 100%  100%  Weight: 151 lb 0.3 oz (68.5 kg)     Physical Exam Vitals and nursing note reviewed.  Constitutional:      Appearance: He is not ill-appearing or toxic-appearing.  HENT:     Head: Normocephalic.     Mouth/Throat:     Mouth: Mucous membranes are dry.  Eyes:     Conjunctiva/sclera: Conjunctivae normal.  Cardiovascular:     Rate and Rhythm: Regular rhythm. Tachycardia present.     Pulses: Normal pulses.     Heart sounds: Normal heart sounds.  Pulmonary:     Effort: Pulmonary effort is normal.     Breath sounds: Normal breath sounds.  Abdominal:     General: There is no distension.  Musculoskeletal:     Cervical back: Normal range of motion.  Skin:    General: Skin is warm and dry.  Neurological:     Mental Status: He is alert.     Comments: Speech is clear and goal oriented, follows commands CN III-XII intact, no facial droop Normal strength in upper and lower extremities bilaterally including dorsiflexion and plantar flexion, strong and equal grip strength Sensation normal to light and sharp touch Moves extremities without ataxia, coordination intact Normal gait and balance          LABORATORY DATA: I have reviewed the data as listed    Latest Ref Rng & Units 11/23/2023   10:26 AM 11/17/2023    9:06 AM 11/10/2023   12:56 PM  CBC  WBC 4.0 - 10.5 K/uL 6.3  4.9  8.7  Hemoglobin 13.0 - 17.0 g/dL 78.2  95.6  21.3   Hematocrit  39.0 - 52.0 % 42.0  38.7  40.2   Platelets 150 - 400 K/uL 362  304  139         Latest Ref Rng & Units 11/23/2023   10:26 AM 11/17/2023    9:06 AM 11/10/2023   12:56 PM  CMP  Glucose 70 - 99 mg/dL 086  578  469   BUN 8 - 23 mg/dL 25  17  13    Creatinine 0.61 - 1.24 mg/dL 6.29  5.28  4.13   Sodium 135 - 145 mmol/L 129  134  137   Potassium 3.5 - 5.1 mmol/L 3.6  3.6  3.2   Chloride 98 - 111 mmol/L 93  98  98   CO2 22 - 32 mmol/L 23  26  31    Calcium 8.9 - 10.3 mg/dL 9.2  9.1  9.1   Total Protein 6.5 - 8.1 g/dL 6.9  7.0  6.3   Total Bilirubin 0.0 - 1.2 mg/dL 0.9  0.4  0.7   Alkaline Phos 38 - 126 U/L 94  80  94   AST 15 - 41 U/L 28  17  12    ALT 0 - 44 U/L 29  7  8         RADIOGRAPHIC STUDIES (from last 24 hours if applicable) I have personally reviewed the radiological images as listed and agreed with the findings in the report. No results found.      Visit Diagnosis: 1. Adenocarcinoma of upper lobe of left lung (HCC)   2. Nausea without vomiting   3. Acute nonintractable headache, unspecified headache type      No orders of the defined types were placed in this encounter.   All questions were answered. The patient knows to call the clinic with any problems, questions or concerns. No barriers to learning was detected.  A total of more than 30 minutes were spent on this encounter with face-to-face time and non-face-to-face time, including preparing to see the patient, ordering tests and/or medications, counseling the patient and coordination of care as outlined above.    Thank you for allowing me to participate in the care of this patient.    Shanon Ace, PA-C Department of Hematology/Oncology [facility] Phone: [phone]  [phone]    11/24/2023 9:28 AM

## 2023-11-24 ENCOUNTER — Inpatient Hospital Stay: Payer: MEDICAID

## 2023-11-24 ENCOUNTER — Telehealth: Payer: Self-pay | Admitting: Medical Oncology

## 2023-11-24 ENCOUNTER — Encounter: Payer: Self-pay | Admitting: Internal Medicine

## 2023-11-24 NOTE — Telephone Encounter (Signed)
 BJ called -"Artemis  has trouble finding words and cannot finish his sentences. He couldn't figure out how to  turn on his computer or how to answer his phone".   I told BJ to hang up the phone and  I told Keiffer to answer his phone when it rang and he did and was appropriate.  Kip understood what I was asking and seemed appropriate. He sounded angry that BJ called.   BJ thinks his symptoms are from stress.   I heard pt in the background  say " don"t worry about it  I am not doing anything right now."   I told BJ to monitor for other symptoms and if they worsen including one sided weakness, facial droop, slurred speech , falls or other stroke like symptoms to get pt to ED.

## 2023-11-27 ENCOUNTER — Other Ambulatory Visit: Payer: Self-pay

## 2023-12-01 ENCOUNTER — Telehealth: Payer: Self-pay

## 2023-12-01 ENCOUNTER — Inpatient Hospital Stay: Payer: MEDICAID

## 2023-12-01 NOTE — Telephone Encounter (Signed)
 Patients friend, BJ called and LVM stating that patient was "being stubborn, mad at me for trying to get him ready to go, and still smoking so he is coughing." Attempted to reach patient and offer lab appt for tomorrow since appt was missed today.  LVM for return call.

## 2023-12-02 ENCOUNTER — Other Ambulatory Visit: Payer: Self-pay

## 2023-12-02 ENCOUNTER — Other Ambulatory Visit: Payer: Self-pay | Admitting: Physician Assistant

## 2023-12-02 ENCOUNTER — Other Ambulatory Visit: Payer: Self-pay | Admitting: Medical Oncology

## 2023-12-02 ENCOUNTER — Inpatient Hospital Stay: Payer: MEDICAID

## 2023-12-02 DIAGNOSIS — Z5111 Encounter for antineoplastic chemotherapy: Secondary | ICD-10-CM | POA: Diagnosis not present

## 2023-12-02 DIAGNOSIS — C3412 Malignant neoplasm of upper lobe, left bronchus or lung: Secondary | ICD-10-CM

## 2023-12-02 LAB — CBC WITH DIFFERENTIAL (CANCER CENTER ONLY)
Abs Immature Granulocytes: 0.12 10*3/uL — ABNORMAL HIGH (ref 0.00–0.07)
Basophils Absolute: 0 10*3/uL (ref 0.0–0.1)
Basophils Relative: 0 %
Eosinophils Absolute: 0.2 10*3/uL (ref 0.0–0.5)
Eosinophils Relative: 2 %
HCT: 34.8 % — ABNORMAL LOW (ref 39.0–52.0)
Hemoglobin: 11.7 g/dL — ABNORMAL LOW (ref 13.0–17.0)
Immature Granulocytes: 2 %
Lymphocytes Relative: 19 %
Lymphs Abs: 1.6 10*3/uL (ref 0.7–4.0)
MCH: 29.4 pg (ref 26.0–34.0)
MCHC: 33.6 g/dL (ref 30.0–36.0)
MCV: 87.4 fL (ref 80.0–100.0)
Monocytes Absolute: 1.6 10*3/uL — ABNORMAL HIGH (ref 0.1–1.0)
Monocytes Relative: 20 %
Neutro Abs: 4.7 10*3/uL (ref 1.7–7.7)
Neutrophils Relative %: 57 %
Platelet Count: 135 10*3/uL — ABNORMAL LOW (ref 150–400)
RBC: 3.98 MIL/uL — ABNORMAL LOW (ref 4.22–5.81)
RDW: 15.2 % (ref 11.5–15.5)
Smear Review: NORMAL
WBC Count: 8.1 10*3/uL (ref 4.0–10.5)
nRBC: 0 % (ref 0.0–0.2)

## 2023-12-02 LAB — CMP (CANCER CENTER ONLY)
ALT: 10 U/L (ref 0–44)
AST: 18 U/L (ref 15–41)
Albumin: 3.7 g/dL (ref 3.5–5.0)
Alkaline Phosphatase: 89 U/L (ref 38–126)
Anion gap: 12 (ref 5–15)
BUN: 13 mg/dL (ref 8–23)
CO2: 27 mmol/L (ref 22–32)
Calcium: 8.9 mg/dL (ref 8.9–10.3)
Chloride: 91 mmol/L — ABNORMAL LOW (ref 98–111)
Creatinine: 1.09 mg/dL (ref 0.61–1.24)
GFR, Estimated: >60 mL/min (ref >60)
Glucose, Bld: 115 mg/dL — ABNORMAL HIGH (ref 70–99)
Potassium: 4.1 mmol/L (ref 3.5–5.1)
Sodium: 130 mmol/L — ABNORMAL LOW (ref 135–145)
Total Bilirubin: 1 mg/dL (ref 0.0–1.2)
Total Protein: 7.1 g/dL (ref 6.5–8.1)

## 2023-12-02 LAB — MAGNESIUM: Magnesium: 1.4 mg/dL — ABNORMAL LOW (ref 1.7–2.4)

## 2023-12-02 MED ORDER — MAGNESIUM OXIDE -MG SUPPLEMENT 400 (240 MG) MG PO TABS
400.0000 mg | ORAL_TABLET | Freq: Every day | ORAL | Status: DC
Start: 2023-12-02 — End: 2024-04-08

## 2023-12-02 MED ORDER — MAGNESIUM OXIDE -MG SUPPLEMENT 400 (240 MG) MG PO TABS
400.0000 mg | ORAL_TABLET | Freq: Every day | ORAL | 0 refills | Status: DC
Start: 2023-12-02 — End: 2023-12-02

## 2023-12-02 NOTE — Progress Notes (Signed)
 BJ notified of Mag oxide rx sent to preferred pharmacy.

## 2023-12-02 NOTE — Progress Notes (Signed)
 I received a call from pt's friend/caretaker, BJ. BJ states she called Dr Pollie Friar nurse as well. She wanted to let us know that he was "interested in eating" on Monday and over the weekend, then on Tuesday (3/11)  he didn't or eat drink any thing, refused to go to his lab appt, stayed in bed all day, and is still smoking. Morrie Sheldon, LPN in [facility] office called pt yesterday after he missed his lab appt and LVM to schedule an appt for today. Pt has not yet returned her call.  Today she reports he has had some water and a little bit to eat this morning. She repeats often that he knows what he needs to do, but he gets angry and annoyed with her. I told her I could touch base with Dr Pollie Friar nurses to see if he should come in for labs today or wait until his next infusion. She says he may listen if a nurse tells him what he needs to do. I told her someone will be getting in touch with him soon.

## 2023-12-02 NOTE — Telephone Encounter (Signed)
 Spoke with patients friend BJ about lab appt.  She states that the patient is asleep right now and offered to make lab appt for 3 pm today.  Lab appt made.  Informed BJ if patient doesn't want to come today, to call and cancel. BJ stated that sometimes patient doesn't want to eat or drink. Informed BJ to encourage fluid intake, small meals and take nausea medication before eating.

## 2023-12-07 ENCOUNTER — Inpatient Hospital Stay: Payer: MEDICAID

## 2023-12-07 DIAGNOSIS — C3412 Malignant neoplasm of upper lobe, left bronchus or lung: Secondary | ICD-10-CM

## 2023-12-07 DIAGNOSIS — Z5111 Encounter for antineoplastic chemotherapy: Secondary | ICD-10-CM | POA: Diagnosis not present

## 2023-12-07 LAB — CMP (CANCER CENTER ONLY)
ALT: 10 U/L (ref 0–44)
AST: 16 U/L (ref 15–41)
Albumin: 3.7 g/dL (ref 3.5–5.0)
Alkaline Phosphatase: 94 U/L (ref 38–126)
Anion gap: 9 (ref 5–15)
BUN: 17 mg/dL (ref 8–23)
CO2: 31 mmol/L (ref 22–32)
Calcium: 9.3 mg/dL (ref 8.9–10.3)
Chloride: 93 mmol/L — ABNORMAL LOW (ref 98–111)
Creatinine: 1.36 mg/dL — ABNORMAL HIGH (ref 0.61–1.24)
GFR, Estimated: 59 mL/min — ABNORMAL LOW (ref >60)
Glucose, Bld: 117 mg/dL — ABNORMAL HIGH (ref 70–99)
Potassium: 4.2 mmol/L (ref 3.5–5.1)
Sodium: 133 mmol/L — ABNORMAL LOW (ref 135–145)
Total Bilirubin: 0.5 mg/dL (ref 0.0–1.2)
Total Protein: 7.4 g/dL (ref 6.5–8.1)

## 2023-12-07 LAB — CBC WITH DIFFERENTIAL (CANCER CENTER ONLY)
Abs Immature Granulocytes: 0.2 10*3/uL — ABNORMAL HIGH (ref 0.00–0.07)
Basophils Absolute: 0.1 10*3/uL (ref 0.0–0.1)
Basophils Relative: 1 %
Eosinophils Absolute: 0 10*3/uL (ref 0.0–0.5)
Eosinophils Relative: 0 %
HCT: 32.9 % — ABNORMAL LOW (ref 39.0–52.0)
Hemoglobin: 11.3 g/dL — ABNORMAL LOW (ref 13.0–17.0)
Immature Granulocytes: 2 %
Lymphocytes Relative: 17 %
Lymphs Abs: 1.9 10*3/uL (ref 0.7–4.0)
MCH: 29.8 pg (ref 26.0–34.0)
MCHC: 34.3 g/dL (ref 30.0–36.0)
MCV: 86.8 fL (ref 80.0–100.0)
Monocytes Absolute: 1.1 10*3/uL — ABNORMAL HIGH (ref 0.1–1.0)
Monocytes Relative: 10 %
Neutro Abs: 8.1 10*3/uL — ABNORMAL HIGH (ref 1.7–7.7)
Neutrophils Relative %: 70 %
Platelet Count: 503 10*3/uL — ABNORMAL HIGH (ref 150–400)
RBC: 3.79 MIL/uL — ABNORMAL LOW (ref 4.22–5.81)
RDW: 15.6 % — ABNORMAL HIGH (ref 11.5–15.5)
WBC Count: 11.4 10*3/uL — ABNORMAL HIGH (ref 4.0–10.5)
nRBC: 0 % (ref 0.0–0.2)

## 2023-12-07 LAB — MAGNESIUM: Magnesium: 2.2 mg/dL (ref 1.7–2.4)

## 2023-12-07 MED FILL — Fosaprepitant Dimeglumine For IV Infusion 150 MG (Base Eq): INTRAVENOUS | Qty: 5 | Status: AC

## 2023-12-08 ENCOUNTER — Inpatient Hospital Stay: Payer: MEDICAID | Admitting: Dietician

## 2023-12-08 ENCOUNTER — Encounter: Payer: Self-pay | Admitting: Internal Medicine

## 2023-12-08 ENCOUNTER — Inpatient Hospital Stay (HOSPITAL_BASED_OUTPATIENT_CLINIC_OR_DEPARTMENT_OTHER): Payer: MEDICAID | Admitting: Internal Medicine

## 2023-12-08 ENCOUNTER — Inpatient Hospital Stay: Payer: MEDICAID

## 2023-12-08 ENCOUNTER — Other Ambulatory Visit: Payer: MEDICAID

## 2023-12-08 VITALS — HR 127

## 2023-12-08 VITALS — BP 103/85 | HR 132 | Temp 98.1°F | Resp 16 | Ht 72.0 in | Wt 146.8 lb

## 2023-12-08 DIAGNOSIS — Z5111 Encounter for antineoplastic chemotherapy: Secondary | ICD-10-CM | POA: Diagnosis not present

## 2023-12-08 DIAGNOSIS — C3490 Malignant neoplasm of unspecified part of unspecified bronchus or lung: Secondary | ICD-10-CM | POA: Diagnosis not present

## 2023-12-08 DIAGNOSIS — C3412 Malignant neoplasm of upper lobe, left bronchus or lung: Secondary | ICD-10-CM

## 2023-12-08 MED ORDER — SODIUM CHLORIDE 0.9 % IV SOLN
150.0000 mg | Freq: Once | INTRAVENOUS | Status: AC
  Administered 2023-12-08: 150 mg via INTRAVENOUS
  Filled 2023-12-08: qty 150

## 2023-12-08 MED ORDER — PALONOSETRON HCL INJECTION 0.25 MG/5ML
0.2500 mg | Freq: Once | INTRAVENOUS | Status: AC
Start: 2023-12-08 — End: 2023-12-08
  Administered 2023-12-08: 0.25 mg via INTRAVENOUS
  Filled 2023-12-08: qty 5

## 2023-12-08 MED ORDER — DEXAMETHASONE SODIUM PHOSPHATE 10 MG/ML IJ SOLN
10.0000 mg | Freq: Once | INTRAMUSCULAR | Status: AC
  Administered 2023-12-08: 10 mg via INTRAVENOUS
  Filled 2023-12-08: qty 1

## 2023-12-08 MED ORDER — POTASSIUM CHLORIDE IN NACL 20-0.9 MEQ/L-% IV SOLN
Freq: Once | INTRAVENOUS | Status: AC
Start: 2023-12-08 — End: 2023-12-08
  Filled 2023-12-08: qty 1000

## 2023-12-08 MED ORDER — SODIUM CHLORIDE 0.9 % IV SOLN
500.0000 mg/m2 | Freq: Once | INTRAVENOUS | Status: AC
  Administered 2023-12-08: 1000 mg via INTRAVENOUS
  Filled 2023-12-08: qty 40

## 2023-12-08 MED ORDER — SODIUM CHLORIDE 0.9 % IV SOLN
75.0000 mg/m2 | Freq: Once | INTRAVENOUS | Status: AC
  Administered 2023-12-08: 150 mg via INTRAVENOUS
  Filled 2023-12-08: qty 150

## 2023-12-08 MED ORDER — MAGNESIUM SULFATE 2 GM/50ML IV SOLN
2.0000 g | Freq: Once | INTRAVENOUS | Status: AC
  Administered 2023-12-08: 2 g via INTRAVENOUS
  Filled 2023-12-08: qty 50

## 2023-12-08 MED ORDER — SODIUM CHLORIDE 0.9 % IV SOLN: INTRAVENOUS | Status: DC

## 2023-12-08 MED ORDER — SODIUM CHLORIDE 0.9 % IV SOLN
INTRAVENOUS | Status: AC
Start: 2023-12-08 — End: 2023-12-08

## 2023-12-08 NOTE — Progress Notes (Signed)
 [facility] [phone]   [phone]  OFFICE PROGRESS NOTE  [name], MD [address] [place] [address]  DIAGNOSIS: Stage IIb (T3, N0, M0) non-small cell lung cancer, adenocarcinoma diagnosed in October 2024  PRIOR THERAPY: Status post left upper lobectomy with lymph node dissection under the care of Dr. Dorris Fetch on July 23, 2023.  The tumor measured 1.3 cm with extension into the attached parietal pleural adipose tissue.  CURRENT THERAPY: Adjuvant systemic chemotherapy with cisplatin 75 Mg/M2 and Alimta 500 Mg/M2 every 3 weeks.  First dose October 05, 2022.  Status post 3  cycles.  INTERVAL HISTORY: [name] 63 y.o. male returns to the clinic today for follow-up visit accompanied by his friend.Discussed the use of AI scribe software for clinical note transcription with the patient, who gave verbal consent to proceed.  History of Present Illness   [name] is a 63 year old male with stage 2B non-small cell lung cancer who presents for chemotherapy follow-up. He is accompanied by his friend, [name].  He was diagnosed with stage 2B non-small cell lung cancer, adenocarcinoma, in October 2024. He underwent a lobectomy and subsequently started adjuvant chemotherapy with cisplatin and Alimta every three months. He has completed three cycles and is currently undergoing his fourth cycle, which is anticipated to be his last.  Over the past three months, he has experienced a noticeable loss of appetite, resulting in a weight loss of approximately 20 pounds. Since his last visit, he has lost around four pounds, with his current weight being 146.8 pounds. He eats less and occasionally takes nausea medication, specifically Compazine, which he finds helpful despite not experiencing nausea.  He feels tired, sore, and stiff, and has had trouble focusing. Recently, he experienced a scare when he was dehydrated and  temporarily lost his vision, which he attributes to dehydration. His blood pressure readings can vary between arms.  His magnesium levels have improved to 2.2, which is within the normal range, but he continues to take magnesium supplements. His serum creatinine was noted to be slightly elevated in a lab test done yesterday.        MEDICAL HISTORY: Past Medical History:  Diagnosis Date   Alcoholic (HCC)    Bipolar 1 disorder (HCC)    Depression    Diabetes mellitus without complication (HCC)    Per pt he is pre-diabetic   Homeless    Schizoaffective schizophrenia (HCC)     ALLERGIES:  has no known allergies.  MEDICATIONS:  Current Outpatient Medications  Medication Sig Dispense Refill   acetaminophen (TYLENOL) 500 MG tablet Take 500-1,000 mg by mouth every 6 (six) hours as needed for moderate pain (pain score 4-6).     benzonatate (TESSALON) 100 MG capsule Take 1 capsule (100 mg total) by mouth 3 (three) times daily as needed. 90 capsule 2   cetirizine (ZYRTEC) 10 MG tablet Take 10 mg by mouth daily as needed for allergies.     dexamethasone (DECADRON) 4 MG tablet Take 1 tab 2 times daily starting day before pemetrexed. Then take 2 tabs daily x 3 days starting day after cisplatin. Take with food. 30 tablet 1   folic acid (FOLVITE) 1 MG tablet Take 1 tablet (1 mg total) by mouth daily. Start 7 days before pemetrexed chemotherapy. Continue until 21 days after pemetrexed completed. 100 tablet 3   gabapentin (NEURONTIN) 300 MG capsule Take 1 capsule (300 mg total) by mouth 2 (two)  times daily. (Patient not taking: Reported on 09/08/2023) 60 capsule 0   ibuprofen (ADVIL) 200 MG tablet Take 400 mg by mouth every 6 (six) hours as needed for moderate pain (pain score 4-6).     indomethacin (INDOCIN) 50 MG capsule Take 1 capsule (50 mg total) by mouth 3 (three) times daily with meals. (Patient taking differently: Take 50 mg by mouth 3 (three) times daily as needed (gout).) 50 capsule 2    magnesium oxide (MAG-OX) 400 (240 Mg) MG tablet Take 1 tablet (400 mg total) by mouth daily. 30 tablet 02   methocarbamol (ROBAXIN) 500 MG tablet Take 1 tablet (500 mg total) by mouth every 6 (six) hours as needed for muscle spasms. (Patient not taking: Reported on 09/08/2023) 28 tablet 0   prochlorperazine (COMPAZINE) 10 MG tablet Take 1 tablet (10 mg total) by mouth every 6 (six) hours as needed for nausea or vomiting. 30 tablet 1   No current facility-administered medications for this visit.    SURGICAL HISTORY:  Past Surgical History:  Procedure Laterality Date   COLONOSCOPY  07/16/2023   DENTAL SURGERY     wisdom teeth   INTERCOSTAL NERVE BLOCK Left 07/23/2023   Procedure: INTERCOSTAL NERVE BLOCK;  Surgeon: [name], MD;  Location: [facility] OR;  Service: Thoracic;  Laterality: Left;   LYMPH NODE BIOPSY Left 07/23/2023   Procedure: LYMPH NODE BIOPSY;  Surgeon: [name], MD;  Location: [facility] OR;  Service: Thoracic;  Laterality: Left;    REVIEW OF SYSTEMS:  Constitutional: positive for anorexia, fatigue, and weight loss Eyes: negative Ears, nose, mouth, throat, and face: negative Respiratory: negative Cardiovascular: negative Gastrointestinal: positive for nausea Genitourinary:negative Integument/breast: negative Hematologic/lymphatic: negative Musculoskeletal:negative Neurological: negative Behavioral/Psych: negative Endocrine: negative Allergic/Immunologic: negative   PHYSICAL EXAMINATION: General appearance: alert, cooperative, fatigued, and no distress Head: Normocephalic, without obvious abnormality, atraumatic Neck: no adenopathy, no JVD, supple, symmetrical, trachea midline, and thyroid not enlarged, symmetric, no tenderness/mass/nodules Lymph nodes: Cervical, supraclavicular, and axillary nodes normal. Resp: clear to auscultation bilaterally Back: symmetric, no curvature. ROM normal. No CVA tenderness. Cardio: regular rate and rhythm, S1, S2 normal,  no murmur, click, rub or gallop GI: soft, non-tender; bowel sounds normal; no masses,  no organomegaly Extremities: extremities normal, atraumatic, no cyanosis or edema Neurologic: Alert and oriented X 3, normal strength and tone. Normal symmetric reflexes. Normal coordination and gait  ECOG PERFORMANCE STATUS: 1 - Symptomatic but completely ambulatory  Blood pressure 103/85, pulse (!) 132, temperature 98.1 F (36.7 C), temperature source Temporal, resp. rate 16, height 6' (1.829 m), weight 146 lb 12.8 oz (66.6 kg), SpO2 100%.  LABORATORY DATA: Lab Results  Component Value Date   WBC 11.4 (H) 12/07/2023   HGB 11.3 (L) 12/07/2023   HCT 32.9 (L) 12/07/2023   MCV 86.8 12/07/2023   PLT 503 (H) 12/07/2023      Chemistry      Component Value Date/Time   NA 133 (L) 12/07/2023 1330   K 4.2 12/07/2023 1330   CL 93 (L) 12/07/2023 1330   CO2 31 12/07/2023 1330   BUN 17 12/07/2023 1330   CREATININE 1.36 (H) 12/07/2023 1330   CREATININE 1.09 03/24/2023 0000      Component Value Date/Time   CALCIUM 9.3 12/07/2023 1330   ALKPHOS 94 12/07/2023 1330   AST 16 12/07/2023 1330   ALT 10 12/07/2023 1330   BILITOT 0.5 12/07/2023 1330       RADIOGRAPHIC STUDIES: No results found.   ASSESSMENT AND PLAN:  This is a very pleasant 63 years old white male with Stage IIb (T3, N0, M0) non-small cell lung cancer, adenocarcinoma diagnosed in October 2024 The patient is status post left upper lobectomy with lymph node dissection under the care of Dr. Dorris Fetch on July 23, 2023.  The tumor measured 1.3 cm with extension into the attached parietal pleural adipose tissue. He is currently undergoing adjuvant systemic chemotherapy with cisplatin 75 Mg/M2 and Alimta 500 Mg/M2 every 3 weeks.  First dose October 05, 2022 status post 3 cycles. The patient has been tolerating this treatment well except for fatigue, lack of appetite and dehydration.    Stage IIB non-small cell lung cancer,  adenocarcinoma Diagnosed in October 2024, he underwent an upper lobectomy and is on his fourth and final cycle of adjuvant chemotherapy with cisplatin and pemetrexed. He reports anorexia, a four-pound weight loss, fatigue, soreness, stiffness, and occasional difficulty focusing, likely due to dehydration. Hypotension and tachycardia suggest dehydration, and elevated serum creatinine indicates possible renal impairment. A follow-up scan is planned in one month to assess treatment response. Favorable results will lead to surveillance with periodic labs and scans. - Administer additional fluids today to address dehydration. - Continue chemotherapy with cisplatin and pemetrexed. - Order a follow-up scan in one month to assess treatment response. - Monitor renal function and encourage hydration to prevent further impairment. - Continue magnesium supplementation as levels are normal but require maintenance.  Dehydration Exhibits signs of dehydration, including hypotension, tachycardia, and difficulty focusing. Temporary vision issues and elevated serum creatinine suggest renal stress from dehydration. - Administer additional fluids today to address dehydration. - Encourage increased fluid intake to prevent further dehydration and renal impairment. - Advise to contact the clinic early if experiencing increased fatigue or dehydration symptoms for prompt fluid administration.  Follow-up A follow-up plan is in place to monitor treatment response and manage ongoing issues related to cancer and treatment side effects. A scan is scheduled in three weeks, with a follow-up appointment in four weeks to review results and assess overall health. - Schedule a follow-up scan in three weeks. - Arrange a follow-up appointment in four weeks to review scan results and assess overall health. - Provide contact information for scheduling to ensure timely appointment for the scan.   He was advised to call immediately if he  has any concerning symptoms in the interval. The patient voices understanding of current disease status and treatment options and is in agreement with the current care plan.  All questions were answered. The patient knows to call the clinic with any problems, questions or concerns. We can certainly see the patient much sooner if necessary.  The total time spent in the appointment was 30 minutes.  Disclaimer: This note was dictated with voice recognition software. Similar sounding words can inadvertently be transcribed and may not be corrected upon review.

## 2023-12-08 NOTE — Progress Notes (Unsigned)
 Nutrition Assessment   Reason for Assessment: +MST   ASSESSMENT: 63 year old male with NSCLC s/p left lobectomy under the care of Dr. Dorris Fetch 10/31. Pt receiving adjuvant cisplatin + alimta. Patient is followed by Dr. Arbutus Ped.   Past medical history includes HTN, tobacco use disorder, MDD, SI, h/o EtOH abuse, gout (per pt)  Met with pt and mother in infusion. Pt has little interest in discussing nutrition. Reports final therapy today so it does not matter. States he has gout and can not eat protein. He does not provide dietary recall other than not liking vegetables. Planning on going to [employer] for a beef/bean burrito this afternoon. Per mom, pt did not drink much of anything yesterday. He has been sleeping a lot.     Nutrition Focused Physical Exam: deferred   Medications: decadron, folic acid, gabapentin, indocin, mag-oz, robaxin, compazine   Labs: Na 133, glucose 117, Cr 1.38   Anthropometrics:   Height: 6 Weight: 146 lb 12.8 oz  UBW: 174-178 lb (prior to diagnosis) BMI: 19.91    NUTRITION DIAGNOSIS: Unintended wt loss related to side effects of therapy as evidenced by increased fatigue, 12% wt loss in 6 weeks which is severe for time frame    INTERVENTION:  Educated on protein sources, encourage lean protein source at every meal - recommend avoiding processed meats with history of gout - handout with ideas provided  Discussed importance of hydration, recommend 8-10 glasses  Contact information provided    MONITORING, EVALUATION, GOAL: Pt will tolerate increased calories and protein to promote wt gain   Next Visit: No follow-up scheduled. Pt has contact information

## 2023-12-08 NOTE — Progress Notes (Signed)
 Per Arbutus Ped, MD okay to proceed with elevated HR. Additional 1L fluid to be administered with txt.

## 2023-12-09 ENCOUNTER — Encounter: Payer: Self-pay | Admitting: Internal Medicine

## 2023-12-09 ENCOUNTER — Other Ambulatory Visit: Payer: Self-pay

## 2023-12-10 ENCOUNTER — Other Ambulatory Visit: Payer: Self-pay

## 2023-12-18 ENCOUNTER — Telehealth: Payer: Self-pay

## 2023-12-18 ENCOUNTER — Other Ambulatory Visit: Payer: Self-pay

## 2023-12-18 ENCOUNTER — Other Ambulatory Visit: Payer: Self-pay | Admitting: Physician Assistant

## 2023-12-18 ENCOUNTER — Ambulatory Visit: Payer: MEDICAID

## 2023-12-18 DIAGNOSIS — R630 Anorexia: Secondary | ICD-10-CM

## 2023-12-18 MED ORDER — METHYLPREDNISOLONE 4 MG PO TBPK: ORAL_TABLET | ORAL | 0 refills | Status: DC

## 2023-12-18 NOTE — Telephone Encounter (Signed)
 Received telephone call from Bj Andria Meuse [facility]). BJ reports patient is Not eating or drinking.  Bj states patient age a couple bites of pears and one bite of cantaloupe all day yesterday.  Bj states patient is refusing to take compazine, magnesium, or his folic acid medication this week.   Afebrile, no vomiting, no diarrhea Not sure of his last BM  Urinated earlier this am  Concerned that patient may need IV fluids Bj states patient is 10 days post treatment..Inquiring appetite stimulant. Let Bj know that I would fwd her message to [facility] [place]. Bj telephone # [phone]

## 2023-12-18 NOTE — Telephone Encounter (Signed)
 Called patient, made BJ aware that he can come in at 1pm to have fluids given to him. After calling and letting her know about the appointment she called back and stated that he does not want to come in for fluid that his wife instigated the call and he was not doing anything. I made the patient aware that we called in the steroid for him and they can pick it up at the pharmacy. Made wife aware if he gets any worse feeling he will need to go to the ER to be evaluated.    Received telephone call from Bj Andria Meuse [facility]). BJ reports patient is Not eating or drinking.  Bj states patient age a couple bites of pears and one bite of cantaloupe all day yesterday.  Bj states patient is refusing to take compazine, magnesium, or his folic acid medication this week.   Afebrile, no vomiting, no diarrhea Not sure of his last BM  Urinated earlier this am  Concerned that patient may need IV fluids Bj states patient is 10 days post treatment..Inquiring appetite stimulant. Let Bj know that I would fwd her message to [facility] [place]. Bj telephone # [phone]

## 2024-01-04 ENCOUNTER — Telehealth: Payer: Self-pay | Admitting: Medical Oncology

## 2024-01-04 NOTE — Telephone Encounter (Signed)
 Returned call from Access nurse message on 01/03/2024. [name] is doing fine . He is eating a donut and I confirmed his appts for the next 2 weeks with him.

## 2024-01-05 ENCOUNTER — Encounter (HOSPITAL_COMMUNITY): Payer: Self-pay

## 2024-01-05 ENCOUNTER — Ambulatory Visit (HOSPITAL_COMMUNITY)
Admission: RE | Admit: 2024-01-05 | Discharge: 2024-01-05 | Disposition: A | Discharge status: Home / Self Care | Payer: MEDICAID | Source: Ambulatory Visit | Attending: Internal Medicine | Admitting: Internal Medicine

## 2024-01-05 ENCOUNTER — Encounter: Payer: Self-pay | Admitting: Internal Medicine

## 2024-01-05 ENCOUNTER — Inpatient Hospital Stay: Payer: MEDICAID | Attending: Internal Medicine

## 2024-01-05 DIAGNOSIS — F17200 Nicotine dependence, unspecified, uncomplicated: Secondary | ICD-10-CM | POA: Diagnosis not present

## 2024-01-05 DIAGNOSIS — Z79899 Other long term (current) drug therapy: Secondary | ICD-10-CM | POA: Diagnosis not present

## 2024-01-05 DIAGNOSIS — Z902 Acquired absence of lung [part of]: Secondary | ICD-10-CM | POA: Diagnosis not present

## 2024-01-05 DIAGNOSIS — C3412 Malignant neoplasm of upper lobe, left bronchus or lung: Secondary | ICD-10-CM | POA: Diagnosis present

## 2024-01-05 DIAGNOSIS — C3490 Malignant neoplasm of unspecified part of unspecified bronchus or lung: Secondary | ICD-10-CM

## 2024-01-05 LAB — CBC WITH DIFFERENTIAL (CANCER CENTER ONLY)
Abs Immature Granulocytes: 0.49 10*3/uL — ABNORMAL HIGH (ref 0.00–0.07)
Basophils Absolute: 0.1 10*3/uL (ref 0.0–0.1)
Basophils Relative: 1 %
Eosinophils Absolute: 0 10*3/uL (ref 0.0–0.5)
Eosinophils Relative: 0 %
HCT: 32.9 % — ABNORMAL LOW (ref 39.0–52.0)
Hemoglobin: 11 g/dL — ABNORMAL LOW (ref 13.0–17.0)
Immature Granulocytes: 4 %
Lymphocytes Relative: 17 %
Lymphs Abs: 2.3 10*3/uL (ref 0.7–4.0)
MCH: 31.3 pg (ref 26.0–34.0)
MCHC: 33.4 g/dL (ref 30.0–36.0)
MCV: 93.5 fL (ref 80.0–100.0)
Monocytes Absolute: 2.2 10*3/uL — ABNORMAL HIGH (ref 0.1–1.0)
Monocytes Relative: 16 %
Neutro Abs: 8.9 10*3/uL — ABNORMAL HIGH (ref 1.7–7.7)
Neutrophils Relative %: 62 %
Platelet Count: 493 10*3/uL — ABNORMAL HIGH (ref 150–400)
RBC: 3.52 MIL/uL — ABNORMAL LOW (ref 4.22–5.81)
RDW: 21.5 % — ABNORMAL HIGH (ref 11.5–15.5)
WBC Count: 14 10*3/uL — ABNORMAL HIGH (ref 4.0–10.5)
nRBC: 0 % (ref 0.0–0.2)

## 2024-01-05 LAB — CMP (CANCER CENTER ONLY)
ALT: 7 U/L (ref 0–44)
AST: 19 U/L (ref 15–41)
Albumin: 4.1 g/dL (ref 3.5–5.0)
Alkaline Phosphatase: 105 U/L (ref 38–126)
Anion gap: 13 (ref 5–15)
BUN: 18 mg/dL (ref 8–23)
CO2: 27 mmol/L (ref 22–32)
Calcium: 9.9 mg/dL (ref 8.9–10.3)
Chloride: 97 mmol/L — ABNORMAL LOW (ref 98–111)
Creatinine: 1.43 mg/dL — ABNORMAL HIGH (ref 0.61–1.24)
GFR, Estimated: 55 mL/min — ABNORMAL LOW (ref >60)
Glucose, Bld: 111 mg/dL — ABNORMAL HIGH (ref 70–99)
Potassium: 3.9 mmol/L (ref 3.5–5.1)
Sodium: 137 mmol/L (ref 135–145)
Total Bilirubin: 0.6 mg/dL (ref 0.0–1.2)
Total Protein: 7.4 g/dL (ref 6.5–8.1)

## 2024-01-05 LAB — MAGNESIUM: Magnesium: 1.9 mg/dL (ref 1.7–2.4)

## 2024-01-05 MED ORDER — IOHEXOL 300 MG/ML  SOLN
75.0000 mL | Freq: Once | INTRAMUSCULAR | Status: DC | PRN
Start: 2024-01-05 — End: 2024-01-05

## 2024-01-06 ENCOUNTER — Encounter: Payer: Self-pay | Admitting: Internal Medicine

## 2024-01-06 ENCOUNTER — Telehealth: Payer: Self-pay | Admitting: Medical Oncology

## 2024-01-06 NOTE — Telephone Encounter (Signed)
 BJ called with new appt for CT scan . I told her if they cannot get an IV inserted then the scan can be done without contrast. Faythe Hopes in Ct notified.

## 2024-01-06 NOTE — Progress Notes (Signed)
 I received a call from pt's friend/caregiver, BJ, who meant to reach Dr Sheree Dieter nurse, however she called me by mistake but I asked if I could help. BJ tells me that the pt walked out of his CT because they had to try twice to get an IV placed. BJ claims that they weren't aware that the CT was going to be with contrast otherwise she would have gone back with him and told him he needs to stay. BJ asked that I reschedule the CT but not for Wednesday because she's busy. I called [facility], scheduler told me they have spots on Wed 4/16 and 4/17. I gave BJ the times available and she agreed to 4/17 at 4:30pm. While I was talking to BJ I requested that she put me on speaker so that I can get verbal confirmation that the pt plans on going to the scan. I heard the pt say yes. I also emphasized the importance of answering his phone more often as there may be times when we need to get a hold of him. He did not respond, but BJ verbalized understanding.

## 2024-01-07 ENCOUNTER — Ambulatory Visit (HOSPITAL_COMMUNITY)
Admission: RE | Admit: 2024-01-07 | Discharge: 2024-01-07 | Disposition: A | Discharge status: Home / Self Care | Payer: MEDICAID | Source: Ambulatory Visit | Attending: Internal Medicine | Admitting: Internal Medicine

## 2024-01-07 DIAGNOSIS — C3490 Malignant neoplasm of unspecified part of unspecified bronchus or lung: Secondary | ICD-10-CM | POA: Insufficient documentation

## 2024-01-07 MED ORDER — IOHEXOL 300 MG/ML  SOLN
75.0000 mL | Freq: Once | INTRAMUSCULAR | Status: AC | PRN
  Administered 2024-01-07: 75 mL via INTRAVENOUS

## 2024-01-07 MED ORDER — SODIUM CHLORIDE (PF) 0.9 % IJ SOLN
INTRAMUSCULAR | Status: AC
  Filled 2024-01-07: qty 50

## 2024-01-12 ENCOUNTER — Ambulatory Visit: Payer: Self-pay

## 2024-01-12 NOTE — Telephone Encounter (Signed)
 Based on what they are describing he needs to go to be seen in either the ED or urgent care.

## 2024-01-12 NOTE — Telephone Encounter (Signed)
 Copied from CRM [id number]. Topic: Clinical - Red Word Triage >> Jan 12, 2024 10:39 AM Justina Oman C wrote: Red Word that prompted transfer to Nurse Triage: Patient's friend BJ Florie Husband [phone] states patient has left lung surgery 07/23/23 had chemo and has appointment with oncology Dr. Marlene Simas this Thursday. Patient is having a trouble breathing, lightheaded, shortness of breath, very weak and tired not eating or drinking well since finishing chemo the end of March 2025. BJ is concerned if it's due to dehydration and wants to make an appointment, patient used to see Dr. Thelda Finney. Please advise.   E2C2 Pulmonary Triage - Initial Assessment Questions "Chief Complaint (e.g., cough, sob, wheezing, fever, chills, sweat or additional symptoms) *Go to specific symptom protocol after initial questions. Difficulty breathing   "How long have symptoms been present?" This morning   Have you tested for COVID or Flu? Note: If not, ask patient if a home test can be taken. If so, instruct patient to call back for positive results. No  MEDICINES:   "Have you used any OTC meds to help with symptoms?" Yes If yes, ask "What medications?" Antihistamine   "Have you used your inhalers/maintenance medication?" No If yes, "What medications?" N/A  If inhaler, ask "How many puffs and how often?" Note: Review instructions on medication in the chart. N/A  OXYGEN: "Do you wear supplemental oxygen?" No If yes, "How many liters are you supposed to use?" N/A  "Do you monitor your oxygen levels?" Yes If yes, "What is your reading (oxygen level) today?" Has not checked   "What is your usual oxygen saturation reading?"  (Note: Pulmonary O2 sats should be 90% or greater) Unsure    Chief Complaint: Difficulty breathing  Symptoms: Mild shortness of breath, mild lightheadedness  Frequency: Intermittent  Disposition: [] ED /[] Urgent Care (no appt availability in office) / [x] Appointment(In office/virtual)/ []   [facility]/ [] Home Care/ [] Refused Recommended Disposition /[] [facility]/ []  Follow-up with PCP Additional Notes: Patient states that he was a patient of Dr. Thelda Finney before he left the practice. He states that he has recently been treated for lung cancer with surgery and chemotherapy. He states that since being treated he has had intermittent episodes of mild shortness of breath and lightheadedness and would like to be evaluated by pulmonology. Appointment made for the patient next week. Patient instructed to call back for new or worsening symptoms. Patient verbalized understanding and agreement with this plan.    Reason for Disposition  [1] MILD longstanding difficulty breathing AND [2]  SAME as normal  Answer Assessment - Initial Assessment Questions 1. RESPIRATORY STATUS: "Describe your breathing?" (e.g., wheezing, shortness of breath, unable to speak, severe coughing)      Difficulty breathing  2. ONSET: "When did this breathing problem begin?"      Today  3. PATTERN "Does the difficult breathing come and go, or has it been constant since it started?"      Constant  4. SEVERITY: "How bad is your breathing?" (e.g., mild, moderate, severe)    - MILD: No SOB at rest, mild SOB with walking, speaks normally in sentences, can lie down, no retractions, pulse < 100.    - MODERATE: SOB at rest, SOB with minimal exertion and prefers to sit, cannot lie down flat, speaks in phrases, mild retractions, audible wheezing, pulse 100-120.    - SEVERE: Very SOB at rest, speaks in single words, struggling to breathe, sitting hunched forward, retractions, pulse > 120  Mild to moderate  5. RECURRENT SYMPTOM: "Have you had difficulty breathing before?" If Yes, ask: "When was the last time?" and "What happened that time?"      Yes 6. CARDIAC HISTORY: "Do you have any history of heart disease?" (e.g., heart attack, angina, bypass surgery, angioplasty)      No 7. LUNG HISTORY: "Do you  have any history of lung disease?"  (e.g., pulmonary embolus, asthma, emphysema)     Yes 8. CAUSE: "What do you think is causing the breathing problem?"      Recent lung surgery and chemotherapy  9. OTHER SYMPTOMS: "Do you have any other symptoms? (e.g., dizziness, runny nose, cough, chest pain, fever)     Lightheaded 10. O2 SATURATION MONITOR:  "Do you use an oxygen saturation monitor (pulse oximeter) at home?" If Yes, ask: "What is your reading (oxygen level) today?" "What is your usual oxygen saturation reading?" (e.g., 95%)       No  Protocols used: Breathing Difficulty-A-AH

## 2024-01-12 NOTE — Telephone Encounter (Signed)
 Spoke with [name], advised of RB note. Pt refused ED & Urgent Care. Advised pt if he notices any worsening symptoms to contact ED or Urgent Care. Pt verbalized understanding & had no concerns. NFN

## 2024-01-12 NOTE — Telephone Encounter (Signed)
 Former [facility] patient. Please advise Dr. Byrum

## 2024-01-13 ENCOUNTER — Other Ambulatory Visit: Payer: Self-pay

## 2024-01-13 NOTE — Progress Notes (Signed)
 I contacted reading room and requested pts Chest CT from 4/17 results be available for the pt's follow up appt tomorrow

## 2024-01-14 ENCOUNTER — Inpatient Hospital Stay (HOSPITAL_BASED_OUTPATIENT_CLINIC_OR_DEPARTMENT_OTHER): Payer: MEDICAID | Admitting: Internal Medicine

## 2024-01-14 VITALS — BP 124/95 | HR 117 | Temp 97.4°F | Resp 18 | Ht 72.0 in | Wt 133.9 lb

## 2024-01-14 DIAGNOSIS — C3412 Malignant neoplasm of upper lobe, left bronchus or lung: Secondary | ICD-10-CM | POA: Diagnosis not present

## 2024-01-14 DIAGNOSIS — C3490 Malignant neoplasm of unspecified part of unspecified bronchus or lung: Secondary | ICD-10-CM

## 2024-01-14 NOTE — Progress Notes (Signed)
 [facility] [phone]   [phone]  OFFICE PROGRESS NOTE  Troy Congo, MD [address] [place] [address]  DIAGNOSIS: Stage IIb (T3, N0, M0) non-small cell lung cancer, adenocarcinoma diagnosed in October 2024  PRIOR THERAPY: Status post left upper lobectomy with lymph node dissection under the care of Dr. Luna Salinas on July 23, 2023.  The tumor measured 1.3 cm with extension into the attached parietal pleural adipose tissue.  CURRENT THERAPY: Adjuvant systemic chemotherapy with cisplatin  75 Mg/M2 and Alimta 500 Mg/M2 every 3 weeks.  First dose October 05, 2022.  Status post 4  cycles.  INTERVAL HISTORY: [name] 63 y.o. male returns to the clinic today for follow-up visit accompanied by his friend.Discussed the use of AI scribe software for clinical note transcription with the patient, who gave verbal consent to proceed.  History of Present Illness   [name] is a 63 year old male with stage 2B non-small cell lung cancer who presents for evaluation with repeat CT scan of the chest for restaging after adjuvant therapy.  He has a history of stage 2B non-small cell lung cancer, adenocarcinoma, diagnosed in October 2024. He underwent a left upper lobectomy with lymph node dissection, followed by four cycles of adjuvant systemic chemotherapy with cisplatin  and Alimta.  No new symptoms since his last visit. No nausea, vomiting, chest pain, or breathing issues. He feels lightheaded, attributing it to a lack of oxygen and hydration. He has been eating a little more, although he did not take appetite medication due to stomach discomfort. He feels colder than usual, which he associates with not drinking and eating enough.  A recent CT scan of the chest was performed last week, revealing a new area of concern in the left lung, in the same region where he had surgery, which requires further investigation.  He smokes and  acknowledges that quitting smoking may alleviate some of his symptoms.        MEDICAL HISTORY: Past Medical History:  Diagnosis Date   Alcoholic (HCC)    Bipolar 1 disorder (HCC)    Depression    Diabetes mellitus without complication (HCC)    Per pt he is pre-diabetic   Homeless    Schizoaffective schizophrenia (HCC)     ALLERGIES:  has no known allergies.  MEDICATIONS:  Current Outpatient Medications  Medication Sig Dispense Refill   methylPREDNISolone  (MEDROL  DOSEPAK) 4 MG TBPK tablet Use as instructed 21 tablet 0   acetaminophen  (TYLENOL ) 500 MG tablet Take 500-1,000 mg by mouth every 6 (six) hours as needed for moderate pain (pain score 4-6).     benzonatate  (TESSALON ) 100 MG capsule Take 1 capsule (100 mg total) by mouth 3 (three) times daily as needed. 90 capsule 2   cetirizine  (ZYRTEC ) 10 MG tablet Take 10 mg by mouth daily as needed for allergies.     dexamethasone  (DECADRON ) 4 MG tablet Take 1 tab 2 times daily starting day before pemetrexed . Then take 2 tabs daily x 3 days starting day after cisplatin . Take with food. 30 tablet 1   folic acid  (FOLVITE ) 1 MG tablet Take 1 tablet (1 mg total) by mouth daily. Start 7 days before pemetrexed  chemotherapy. Continue until 21 days after pemetrexed  completed. 100 tablet 3   gabapentin  (NEURONTIN ) 300 MG capsule Take 1 capsule (300 mg total) by mouth 2 (two) times daily. (Patient not taking: Reported on 12/08/2023) 60 capsule 0   ibuprofen  (ADVIL ) 200 MG tablet  Take 400 mg by mouth every 6 (six) hours as needed for moderate pain (pain score 4-6).     indomethacin  (INDOCIN ) 50 MG capsule Take 1 capsule (50 mg total) by mouth 3 (three) times daily with meals. (Patient taking differently: Take 50 mg by mouth 3 (three) times daily as needed (gout).) 50 capsule 2   magnesium  oxide (MAG-OX) 400 (240 Mg) MG tablet Take 1 tablet (400 mg total) by mouth daily. 30 tablet 02   methocarbamol  (ROBAXIN ) 500 MG tablet Take 1 tablet (500 mg total) by  mouth every 6 (six) hours as needed for muscle spasms. (Patient not taking: Reported on 09/08/2023) 28 tablet 0   prochlorperazine  (COMPAZINE ) 10 MG tablet Take 1 tablet (10 mg total) by mouth every 6 (six) hours as needed for nausea or vomiting. 30 tablet 1   No current facility-administered medications for this visit.    SURGICAL HISTORY:  Past Surgical History:  Procedure Laterality Date   COLONOSCOPY  07/16/2023   DENTAL SURGERY     wisdom teeth   INTERCOSTAL NERVE BLOCK Left 07/23/2023   Procedure: INTERCOSTAL NERVE BLOCK;  Surgeon: Zelphia Higashi, MD;  Location: [facility] OR;  Service: Thoracic;  Laterality: Left;   LYMPH NODE BIOPSY Left 07/23/2023   Procedure: LYMPH NODE BIOPSY;  Surgeon: Zelphia Higashi, MD;  Location: [facility] OR;  Service: Thoracic;  Laterality: Left;    REVIEW OF SYSTEMS:  Constitutional: positive for anorexia, fatigue, and weight loss Eyes: negative Ears, nose, mouth, throat, and face: negative Respiratory: negative Cardiovascular: negative Gastrointestinal: positive for nausea Genitourinary:negative Integument/breast: negative Hematologic/lymphatic: negative Musculoskeletal:negative Neurological: negative Behavioral/Psych: negative Endocrine: negative Allergic/Immunologic: negative   PHYSICAL EXAMINATION: General appearance: alert, cooperative, fatigued, and no distress Head: Normocephalic, without obvious abnormality, atraumatic Neck: no adenopathy, no JVD, supple, symmetrical, trachea midline, and thyroid not enlarged, symmetric, no tenderness/mass/nodules Lymph nodes: Cervical, supraclavicular, and axillary nodes normal. Resp: clear to auscultation bilaterally Back: symmetric, no curvature. ROM normal. No CVA tenderness. Cardio: regular rate and rhythm, S1, S2 normal, no murmur, click, rub or gallop GI: soft, non-tender; bowel sounds normal; no masses,  no organomegaly Extremities: extremities normal, atraumatic, no cyanosis or  edema Neurologic: Alert and oriented X 3, normal strength and tone. Normal symmetric reflexes. Normal coordination and gait  ECOG PERFORMANCE STATUS: 1 - Symptomatic but completely ambulatory  Blood pressure (!) 124/95, pulse (!) 117, temperature (!) 97.4 F (36.3 C), temperature source Temporal, resp. rate 18, height 6' (1.829 m), weight 133 lb 14.4 oz (60.7 kg), SpO2 100%.  LABORATORY DATA: Lab Results  Component Value Date   WBC 14.0 (H) 01/05/2024   HGB 11.0 (L) 01/05/2024   HCT 32.9 (L) 01/05/2024   MCV 93.5 01/05/2024   PLT 493 (H) 01/05/2024      Chemistry      Component Value Date/Time   NA 137 01/05/2024 1144   K 3.9 01/05/2024 1144   CL 97 (L) 01/05/2024 1144   CO2 27 01/05/2024 1144   BUN 18 01/05/2024 1144   CREATININE 1.43 (H) 01/05/2024 1144   CREATININE 1.09 03/24/2023 0000      Component Value Date/Time   CALCIUM 9.9 01/05/2024 1144   ALKPHOS 105 01/05/2024 1144   AST 19 01/05/2024 1144   ALT 7 01/05/2024 1144   BILITOT 0.6 01/05/2024 1144       RADIOGRAPHIC STUDIES: CT Chest W Contrast Result Date: 01/14/2024 CLINICAL DATA:  Staging non-small-cell lung cancer. * Tracking Code: BO * EXAM: CT CHEST WITH CONTRAST TECHNIQUE:  Multidetector CT imaging of the chest was performed during intravenous contrast administration. RADIATION DOSE REDUCTION: This exam was performed according to the departmental dose-optimization program which includes automated exposure control, adjustment of the mA and/or kV according to patient size and/or use of iterative reconstruction technique. CONTRAST:  75mL OMNIPAQUE  IOHEXOL  300 MG/ML  SOLN COMPARISON:  Chest x-ray 09/08/2023. Chest CT 10/16/2017. PET-CT 05/14/2023. FINDINGS: Cardiovascular: Thoracic aorta has a normal course and caliber with partially calcified atherosclerotic plaque. There is plaque along the great vessels. 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 Reconstitution distal to the left  vertebral artery. Please correlate for subclavian steal. Heart is nonenlarged. Trace pericardial fluid. Coronary calcifications are seen. Mediastinum/Nodes: Slightly patulous thoracic esophagus. Small thyroid gland. No specific abnormal lymph node enlargement identified in the axillary region, hilum or mediastinum. There is a small node in left hilum, less than a cm short axis and not pathologic by size criteria. Lungs/Pleura: Breathing motion. No consolidation, pneumothorax or effusion. Paraseptal and centrilobular emphysematous changes are seen. Apical pleural thickening. There is a punctate calcified nodule in the right upper lobe posteriorly on image 59 of series 6 consistent with old granulomatous disease. There are some areas of mucous plugging identified along the left lung. Surgical changes from left-sided lobectomy. On the prior PET CT there is hypermetabolic nodule in the medial aspect of the left upper lobe which is no longer seen with the interval left upper lobe resection. However there is a new spiculated lesion in the left mid lung measuring 2.7 by 1.4 cm on image 65 of series 6. This has relatively low in density. Upper Abdomen: Adrenal glands are preserved in the upper abdomen. Musculoskeletal: Curvature of the spine with some degenerative changes. Surgical screw along the left scapula. IMPRESSION: Interval left-sided upper lobectomy. Previous lesion no longer identified. However there is a new oblong spiculated low-density nodule measuring 2.7 x 1.4 cm. This would have a differential. Recommend further evaluation. PET-CT scan may be useful as the next step in the workup. 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 Please correlate for any known history and any symptoms of subclavian steal. Further evaluation when clinically appropriate. Aortic Atherosclerosis (ICD10-I70.0) and Emphysema (ICD10-J43.9). Electronically Signed   By: [name] M.D.   On: 01/14/2024 10:26      ASSESSMENT AND PLAN: This is a very pleasant 63 years old white male with Stage IIb (T3, N0, M0) non-small cell lung cancer, adenocarcinoma diagnosed in October 2024 The patient is status post left upper lobectomy with lymph node dissection under the care of Dr. Luna Salinas on July 23, 2023.  The tumor measured 1.3 cm with extension into the attached parietal pleural adipose tissue. He underwent  adjuvant systemic chemotherapy with cisplatin  75 Mg/M2 and Alimta 500 Mg/M2 every 3 weeks.  First dose October 05, 2022 status post 4 cycles. The patient has been tolerating this treatment well except for fatigue, lack of appetite and dehydration. He had repeat CT scan of the chest performed recently.  I personally independently reviewed the scan images and discussed the result and showed the images to the patient today.  His scan showed new oblong spiculated low-density nodule measuring 2.7 x 1.4 cm in the left lung.    Stage 2B non-small cell lung cancer Stage 2B non-small cell lung cancer, adenocarcinoma type, diagnosed in October 2024. Status post left upper lobectomy with lymph node dissection, followed by four cycles of adjuvant systemic chemotherapy with cisplatin  and Alimta. Recent CT  scan shows a new white spot in the left lung, in the same area as the previous surgery. Differential diagnosis includes post-surgical changes versus recurrence of cancer. He is currently asymptomatic with no nausea, vomiting, chest pain, or breathing issues. The radiologist recommends a PET scan to rule out malignancy. - Order PET scan to evaluate the new white spot in the left lung. - Encourage increased fluid intake and nutrition to build strength.  Post-surgical changes in left lung Post-surgical changes in the left lung with a new white spot identified on recent CT scan. This could represent scarring or other changes post-surgery. The spot is located in the lower lobe, as the upper lobe was removed. The  radiologist suggests a PET scan for further evaluation to ensure it is not cancerous. - Order PET scan to further investigate the post-surgical changes in the left lung.   The patient was advised to call immediately if he has any concerning symptoms in the interval. The patient voices understanding of current disease status and treatment options and is in agreement with the current care plan.  All questions were answered. The patient knows to call the clinic with any problems, questions or concerns. We can certainly see the patient much sooner if necessary.  The total time spent in the appointment was 30 minutes.  Disclaimer: This note was dictated with voice recognition software. Similar sounding words can inadvertently be transcribed and may not be corrected upon review.

## 2024-01-15 ENCOUNTER — Other Ambulatory Visit: Payer: Self-pay

## 2024-01-17 ENCOUNTER — Other Ambulatory Visit: Payer: Self-pay

## 2024-01-18 ENCOUNTER — Telehealth: Payer: Self-pay

## 2024-01-19 ENCOUNTER — Ambulatory Visit: Payer: MEDICAID | Admitting: Internal Medicine

## 2024-01-19 ENCOUNTER — Encounter: Payer: Self-pay | Admitting: Internal Medicine

## 2024-01-19 VITALS — BP 110/74 | HR 127 | Ht 72.0 in | Wt 136.0 lb

## 2024-01-19 DIAGNOSIS — J441 Chronic obstructive pulmonary disease with (acute) exacerbation: Secondary | ICD-10-CM | POA: Diagnosis not present

## 2024-01-19 DIAGNOSIS — F17210 Nicotine dependence, cigarettes, uncomplicated: Secondary | ICD-10-CM | POA: Diagnosis not present

## 2024-01-19 DIAGNOSIS — J3089 Other allergic rhinitis: Secondary | ICD-10-CM | POA: Diagnosis not present

## 2024-01-19 DIAGNOSIS — Z9109 Other allergy status, other than to drugs and biological substances: Secondary | ICD-10-CM

## 2024-01-19 MED ORDER — BREZTRI AEROSPHERE 160-9-4.8 MCG/ACT IN AERO
2.0000 | INHALATION_SPRAY | Freq: Two times a day (BID) | RESPIRATORY_TRACT | 12 refills | Status: DC
Start: 2024-01-19 — End: 2024-05-09

## 2024-01-19 MED ORDER — ALBUTEROL SULFATE HFA 108 (90 BASE) MCG/ACT IN AERS: 2.0000 | INHALATION_SPRAY | Freq: Four times a day (QID) | RESPIRATORY_TRACT | 6 refills | Status: AC | PRN

## 2024-01-19 MED ORDER — PREDNISONE 10 MG PO TABS: ORAL_TABLET | ORAL | 0 refills | Status: AC

## 2024-01-19 NOTE — Patient Instructions (Addendum)
 COPD with acute exacerbation (HCC) - Plan: CBC w/Diff, budeson-glycopyrrolate-formoterol (BREZTRI AEROSPHERE) 160-9-4.8 MCG/ACT AERO inhaler, predniSONE  (DELTASONE ) 10 MG tablet, albuterol  (VENTOLIN  HFA) 108 (90 Base) MCG/ACT inhaler  Environmental allergies - Plan: CBC w/Diff   You are having a mild acute COPD exacerbation today. You have previously been on Trelegy without a lot of benefit so we will try a similar medication, Breztri. You will also get a rescue inhaler and steroid taper.  Plan -restart triple therapy with Breztri 2 puffs twice daily, samples given today -as needed albuterol   -treat acute exacerbation with prednisone  taper  -add fluticasone nasal spray daily -return in 2 months with NP for COPD follow up after starting inhalers

## 2024-01-19 NOTE — Progress Notes (Signed)
 Synopsis: Referred in Aug 2024 for lung nodule by No ref. provider found  Subjective:   PATIENT ID: [name] GENDER: male DOB: 1960/09/25, MRN: [id number]  Chief Complaint  Patient presents with   Lung Nodule    This is a 63 year old gentleman, past medical history of alcohol use, has prior psychiatric disease.  He is a current every day smoker.Patient was referred after having a lung cancer screening CT.  This was completed on 04/24/2023.  Patient had an 8 x 6 mm lung nodule in the left upper lobe found on a lung cancer screening done at [facility].  Patient was referred for evaluation of pulmonary nodule.  At his max was smoking 2 packs/day.  He has smoked for 30+ years.  OV 06/15/2023: Here today for follow-up after nuclear medicine pet imaging and PFTs.  Patient was found to have stage II COPD on PFTs reviewed this today in the office as well as nuclear medicine PET scan which shows SUV max of 5 in the posterior left upper lobe lesion concerning for primary bronchogenic carcinoma.  There was no evidence of metastatic disease or adenopathy.     OV 01/19/2024  Subjective:  Patient ID: [name], male , DOB: 10/23/1960 , age 63 y.o. , MRN: [id number] , ADDRESS: [address] [place] [facility] [id number] PCP Charle Congo, MD Patient Care Team: Charle Congo, MD as PCP - General (Internal Medicine) Rosalita Combe, RN as Oncology Nurse Navigator  This Provider for this visit: Treatment Team:  Attending Provider: Maire Scot, MD    01/19/2024 -   Chief Complaint  Patient presents with   Acute Visit    Shortness of breath     HPI [name] 63 y.o. - presents today for acute visit due to dyspnea on exertion.  Previously diagnosed with Gold stage II COPD and given Trelegy samples at last visit in September 2024.  At that time he was also being worked up for pulmonary nodule that ended up being non-small cell lung cancer treated with  lobectomy and chemotherapy which he recently finished.  During chemotherapy he noticed that his shortness of breath got worse and he has had a chronic cough productive of clear sputum that has been worse this month.  He denies any other associated symptoms including fever or chest pain.  He does not remember the Trelegy working very well.  He is still smoking 1 pack/day.  He also has bad allergies and takes cetirizine .  PFT     Latest Ref Rng & Units 06/10/2023    1:57 PM  PFT Results  FVC-Pre L 3.38   FVC-Predicted Pre % 66   FVC-Post L 3.59   FVC-Predicted Post % 71   Pre FEV1/FVC % % 62   Post FEV1/FCV % % 65   FEV1-Pre L 2.09   FEV1-Predicted Pre % 54   FEV1-Post L 2.32   DLCO uncorrected ml/min/mmHg 18.01   DLCO UNC% % 62   DLVA Predicted % 77   TLC L 5.94   TLC % Predicted % 80   RV % Predicted % 119        LAB RESULTS last 96 hours No results found.       has a past medical history of Alcoholic (HCC), Bipolar 1 disorder (HCC), Depression, Diabetes mellitus without complication (HCC), Homeless, and Schizoaffective schizophrenia (HCC).   reports that he has been smoking cigarettes. He started smoking about 47 years ago. He has a 47.3 pack-year  smoking history. He has never used smokeless tobacco.  Past Surgical History:  Procedure Laterality Date   COLONOSCOPY  07/16/2023   DENTAL SURGERY     wisdom teeth   INTERCOSTAL NERVE BLOCK Left 07/23/2023   Procedure: INTERCOSTAL NERVE BLOCK;  Surgeon: Zelphia Higashi, MD;  Location: [facility] OR;  Service: Thoracic;  Laterality: Left;   LYMPH NODE BIOPSY Left 07/23/2023   Procedure: LYMPH NODE BIOPSY;  Surgeon: Zelphia Higashi, MD;  Location: [facility] OR;  Service: Thoracic;  Laterality: Left;    No Known Allergies  There is no immunization history for the selected administration types on file for this patient.  Family History  Problem Relation Age of Onset   Colon cancer Neg Hx    Rectal cancer Neg Hx    Stomach  cancer Neg Hx      Current Outpatient Medications:    acetaminophen  (TYLENOL ) 500 MG tablet, Take 500-1,000 mg by mouth every 6 (six) hours as needed for moderate pain (pain score 4-6)., Disp: , Rfl:    albuterol  (VENTOLIN  HFA) 108 (90 Base) MCG/ACT inhaler, Inhale 2 puffs into the lungs every 6 (six) hours as needed for wheezing or shortness of breath., Disp: 8 g, Rfl: 6   budeson-glycopyrrolate-formoterol (BREZTRI AEROSPHERE) 160-9-4.8 MCG/ACT AERO inhaler, Inhale 2 puffs into the lungs in the morning and at bedtime., Disp: 10.7 g, Rfl: 12   cetirizine  (ZYRTEC ) 10 MG tablet, Take 10 mg by mouth daily as needed for allergies., Disp: , Rfl:    ibuprofen  (ADVIL ) 200 MG tablet, Take 400 mg by mouth every 6 (six) hours as needed for moderate pain (pain score 4-6)., Disp: , Rfl:    indomethacin  (INDOCIN ) 50 MG capsule, Take 1 capsule (50 mg total) by mouth 3 (three) times daily with meals. (Patient taking differently: Take 50 mg by mouth 3 (three) times daily as needed (gout).), Disp: 50 capsule, Rfl: 2   predniSONE  (DELTASONE ) 10 MG tablet, Take 4 tablets (40 mg total) by mouth daily with breakfast for 1 day, THEN 3 tablets (30 mg total) daily with breakfast for 1 day, THEN 2 tablets (20 mg total) daily with breakfast for 1 day, THEN 1 tablet (10 mg total) daily with breakfast for 1 day, THEN 0.5 tablets (5 mg total) daily with breakfast for 1 day., Disp: 20 tablet, Rfl: 0   benzonatate  (TESSALON ) 100 MG capsule, Take 1 capsule (100 mg total) by mouth 3 (three) times daily as needed. (Patient not taking: Reported on 01/19/2024), Disp: 90 capsule, Rfl: 2   dexamethasone  (DECADRON ) 4 MG tablet, Take 1 tab 2 times daily starting day before pemetrexed . Then take 2 tabs daily x 3 days starting day after cisplatin . Take with food. (Patient not taking: Reported on 01/19/2024), Disp: 30 tablet, Rfl: 1   folic acid  (FOLVITE ) 1 MG tablet, Take 1 tablet (1 mg total) by mouth daily. Start 7 days before pemetrexed   chemotherapy. Continue until 21 days after pemetrexed  completed. (Patient not taking: Reported on 01/19/2024), Disp: 100 tablet, Rfl: 3   gabapentin  (NEURONTIN ) 300 MG capsule, Take 1 capsule (300 mg total) by mouth 2 (two) times daily. (Patient not taking: Reported on 01/19/2024), Disp: 60 capsule, Rfl: 0   magnesium  oxide (MAG-OX) 400 (240 Mg) MG tablet, Take 1 tablet (400 mg total) by mouth daily. (Patient not taking: Reported on 01/19/2024), Disp: 30 tablet, Rfl: 02   methocarbamol  (ROBAXIN ) 500 MG tablet, Take 1 tablet (500 mg total) by mouth every 6 (six) hours as needed for  muscle spasms. (Patient not taking: Reported on 01/19/2024), Disp: 28 tablet, Rfl: 0   methylPREDNISolone  (MEDROL  DOSEPAK) 4 MG TBPK tablet, Use as instructed (Patient not taking: Reported on 01/19/2024), Disp: 21 tablet, Rfl: 0   prochlorperazine  (COMPAZINE ) 10 MG tablet, Take 1 tablet (10 mg total) by mouth every 6 (six) hours as needed for nausea or vomiting. (Patient not taking: Reported on 01/19/2024), Disp: 30 tablet, Rfl: 1      Objective:   Vitals:   01/19/24 1540  BP: 110/74  Pulse: (!) 127  SpO2: 98%  Weight: 136 lb (61.7 kg)  Height: 6' (1.829 m)    Estimated body mass index is 18.44 kg/m as calculated from the following:   Height as of this encounter: 6' (1.829 m).   Weight as of this encounter: 136 lb (61.7 kg).  @WEIGHTCHANGE @  [employer]   01/19/24 1540  Weight: 136 lb (61.7 kg)     Physical Exam   Constitutional: Thin but comfortable appearing. In no acute distress Cardio:Regular rate and rhythm. Pulm: Coarse breath sounds bilaterally without wheezing. Normal work of breathing on room air. WUJ:[id number] for extremity edema. Skin:Warm and dry. Neuro:Alert and oriented x3. No focal deficit noted. Psych:Pleasant mood and affect.     Assessment:       ICD-10-CM   1. COPD with acute exacerbation (HCC)  J44.1 CBC w/Diff    budeson-glycopyrrolate-formoterol (BREZTRI AEROSPHERE) 160-9-4.8  MCG/ACT AERO inhaler    predniSONE  (DELTASONE ) 10 MG tablet    albuterol  (VENTOLIN  HFA) 108 (90 Base) MCG/ACT inhaler    2. Environmental allergies  Z91.09 CBC w/Diff         Plan:     Patient Instructions  COPD with acute exacerbation (HCC) - Plan: CBC w/Diff, budeson-glycopyrrolate-formoterol (BREZTRI AEROSPHERE) 160-9-4.8 MCG/ACT AERO inhaler, predniSONE  (DELTASONE ) 10 MG tablet, albuterol  (VENTOLIN  HFA) 108 (90 Base) MCG/ACT inhaler  Environmental allergies - Plan: CBC w/Diff   You are having a mild acute COPD exacerbation today. You have previously been on Trelegy without a lot of benefit so we will try a similar medication, Breztri. You will also get a rescue inhaler and steroid taper.  Plan -restart triple therapy with Breztri 2 puffs twice daily, samples given today -as needed albuterol   -treat acute exacerbation with prednisone  taper  -add fluticasone nasal spray daily -return in 2 months with NP for COPD follow up after starting inhalers   FOLLOWUP Return in about 2 months (around 03/20/2024) for COPD f/u with NP.    [place]   Maire Scot, DO Internal Medicine Resident, PGY-2 5:58 PM 01/19/2024  ATTESTATION & SIGNATURE   STAFF NOTE: I, Dr Llana Rile have personally reviewed patient's available data, including medical history, events of note, physical examination and test results as part of my evaluation. I have discussed with resident/NP and other care providers such as pharmacist, RN and RRT.  In addition,  I personally evaluated patient and elicited key findings of   S 01/19/2024 : Deloras Fess Leclaire is  -stage II-III lung cancer followed by Dr. Marguerita Shih.  Diagnosed origin by Dr. Thelda Finney.  Has a new nodule for which she is having PET scan.  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  He is here to reestablish care in pulmonary after Dr. Dariel Edelson Icard left the practice.  His male friend Annie Kin is here with him.  She is urging him to drink a lot of water.  He is also  reporting significant pollen related respiratory symptoms.  He used to be on Trelegy but  it did not help him.  Prelobectomy he seems to have Gold stage II COPD.  O:  Blood pressure 110/74, pulse (!) 127, height 6' (1.829 m), weight 136 lb (61.7 kg), SpO2 98%.   Cachectic, pleasant but clear to auscultation bilaterally but does have barrel chest.   LABS      Latest Ref Rng & Units 06/10/2023    1:57 PM  PFT Results  FVC-Pre L 3.38   FVC-Predicted Pre % 66   FVC-Post L 3.59   FVC-Predicted Post % 71   Pre FEV1/FVC % % 62   Post FEV1/FCV % % 65   FEV1-Pre L 2.09   FEV1-Predicted Pre % 54   FEV1-Post L 2.32   DLCO uncorrected ml/min/mmHg 18.01   DLCO UNC% % 62   DLVA Predicted % 77   TLC L 5.94   TLC % Predicted % 80   RV % Predicted % 119        A: COPD with some exacerbation because of pollen.  Likely has advanced COPD status post lobectomy.  P: Start Breztri Prednisone  taper Return for follow-up Encouraged to drink water   Anti-infectives (From admission, onward)    None        Rest per NP/medical resident whose note is outlined above and that I agree with  Dr. Maire Scot, M.D., [facility].C.P Pulmonary and Critical Care Medicine Staff Physician [facility] Pager: [phone], If no answer or between  15:00h - 7:00h: call [phone]  01/19/2024 5:58 PM

## 2024-01-20 ENCOUNTER — Telehealth: Payer: Self-pay

## 2024-01-20 ENCOUNTER — Other Ambulatory Visit: Payer: Self-pay

## 2024-01-20 MED ORDER — BREZTRI AEROSPHERE 160-9-4.8 MCG/ACT IN AERO: 2.0000 | INHALATION_SPRAY | Freq: Two times a day (BID) | RESPIRATORY_TRACT | Status: DC

## 2024-01-20 NOTE — Addendum Note (Signed)
 Addended by: Kary Pages on: 01/20/2024 10:53 AM   Modules accepted: Orders

## 2024-01-20 NOTE — Telephone Encounter (Signed)
 [name] (Key: [id number]) PA Case ID #: [id number] Rx #: [id number] Need Help? Call us  at [phone] Outcome Denied today by [employer] 2017 Denied Drug Breztri Aerosphere 160-9-4.8MCG/ACT aerosol ePA cloud logo Form [employer] Electronic Prior Authorization Form Original Claim Info 75

## 2024-01-20 NOTE — Telephone Encounter (Signed)
 Prior Authorization for patient [name] Aerosphere 160-9-4.8MCG/ACT aerosol) came through on cover my meds was submitted with last office notes awaiting approval or denial.  KEY:[id number]

## 2024-01-22 ENCOUNTER — Encounter (HOSPITAL_COMMUNITY)
Admission: RE | Admit: 2024-01-22 | Discharge: 2024-01-22 | Disposition: A | Discharge status: Home / Self Care | Payer: MEDICAID | Source: Ambulatory Visit | Attending: Internal Medicine | Admitting: Internal Medicine

## 2024-01-22 DIAGNOSIS — C3490 Malignant neoplasm of unspecified part of unspecified bronchus or lung: Secondary | ICD-10-CM | POA: Insufficient documentation

## 2024-01-22 LAB — GLUCOSE, CAPILLARY: Glucose-Capillary: 102 mg/dL — ABNORMAL HIGH (ref 70–99)

## 2024-01-22 MED ORDER — FLUDEOXYGLUCOSE F - 18 (FDG) INJECTION
6.0000 | Freq: Once | INTRAVENOUS | Status: AC | PRN
  Administered 2024-01-22: 6.74 via INTRAVENOUS

## 2024-01-27 NOTE — Telephone Encounter (Signed)
 Sending to Rx team

## 2024-01-28 ENCOUNTER — Telehealth: Payer: Self-pay

## 2024-01-28 ENCOUNTER — Telehealth: Payer: Self-pay | Admitting: Medical Oncology

## 2024-01-28 NOTE — Telephone Encounter (Signed)
*  Pulm  Pharmacy Patient Advocate Encounter   Received notification from CoverMyMeds that prior authorization for Breztri  Aerosphere 160-9-4.8MCG/ACT aerosol  is required/requested.   Insurance verification completed.   The patient is insured through [facility] .   Per test claim: PA required; PA submitted to above mentioned insurance via CoverMyMeds Key/confirmation #/EOC [id number] Status is pending

## 2024-01-28 NOTE — Telephone Encounter (Signed)
 I told [name] for [name] to keep appts 05/15 for labs and review PET scan.

## 2024-01-28 NOTE — Telephone Encounter (Signed)
 PA denied due to original denial. Information has been sent to clinical pharmacist for appeals review. It may take 5-7 days to prepare the necessary documentation to request the appeal from the insurance.

## 2024-02-01 ENCOUNTER — Telehealth: Payer: Self-pay | Admitting: Pharmacist

## 2024-02-01 MED ORDER — BUDESONIDE-FORMOTEROL FUMARATE 80-4.5 MCG/ACT IN AERO: 2.0000 | INHALATION_SPRAY | Freq: Two times a day (BID) | RESPIRATORY_TRACT | 11 refills | Status: AC

## 2024-02-01 NOTE — Telephone Encounter (Signed)
 I called and spoke with the pt and notified of response per Dr. Bertrum Brodie  He verbalized understanding  Rx for symbicort was sent to preferred pharm  Nothing further needed

## 2024-02-01 NOTE — Telephone Encounter (Signed)
 Medicaid requires a trial and failure (T/F) of two preferred medications prior to approving alternatives. In this case, the preferred options are Advair, Advair HFA, Dulera, and Symbicort. There is no documentation in Mr. Nurse's chart indicating that any of these have been tried. Medicaid is very strict about adherence to the preferred drug list.  If these options are not clinically appropriate, please provide a clear and well-supported clinical justification. Without this, the appeal will be denied.  Thank you, Dene Fines, PharmD Clinical Pharmacist  [place]  Direct Dial: [phone]

## 2024-02-01 NOTE — Telephone Encounter (Signed)
 Okay let him try Symbicort 80/4.52 puffs 2 times daily.

## 2024-02-04 ENCOUNTER — Ambulatory Visit: Payer: MEDICAID | Admitting: Internal Medicine

## 2024-02-04 ENCOUNTER — Other Ambulatory Visit: Payer: MEDICAID

## 2024-02-09 NOTE — Progress Notes (Signed)
 [facility] OFFICE PROGRESS NOTE  Troy Congo, MD [address] [place] [address]  DIAGNOSIS: Stage IIb (T3, N0, M0) non-small cell lung cancer, adenocarcinoma diagnosed in October 2024   PRIOR THERAPY: 1) Status post left upper lobectomy with lymph node dissection under the care of Dr. Luna Salinas on July 23, 2023. The tumor measured 1.3 cm with extension into the attached parietal pleural adipose tissue.  2) Adjuvant systemic chemotherapy with cisplatin  75 Mg/M2 and Alimta 500 Mg/M2 every 3 weeks.  First dose October 05, 2022.  Status post 4  cycles. This was completed on 12/08/23  CURRENT THERAPY: Observation   INTERVAL HISTORY: [name] 63 y.o. male returns to the clinic today for a follow-up visit accompanied by his wife.  The patient last saw Dr. Marguerita Shih on 01/14/2024.  The patient has a history of stage II non-small cell lung cancer for which he underwent surgical resection in October 2024.  He then underwent 4 cycles of adjuvant chemotherapy which was completed in March 2025.  When he last saw Dr. Marguerita Shih, he had a restaging CT scan prior to that appointment showing an area of concern in the left lung which is the same region where he had surgery. Therefore, Dr. Marguerita Shih recommended a PET scan.  The patient is here today to review his PET scan and discuss the next steps in his care.  He has been experiencing hearing difficulties, requiring the television volume to be increased significantly. He has a history of tinnitus and attributes some of his hearing issues to previous chemotherapy treatment. He is not currently seeking further evaluation for this issue and declined referral to audiologist.  He notes a decrease in his sense of smell and mentions that his appetite is returning, having gained six to eight pounds recently. He wants to eat more, such as having a banana split.  He describes persistent nerve pain and sensitivity in the area where  he had previous surgery, noting 'chill bumps' on his abdomen. He is not interested in taking gabapentin .   He has a history of an abdominal aortic aneurysm, which has been stable on imaging. He has not experienced any severe abdominal or back pain. He has not seen a vascular surgeon.   Today the patient denies any fever, chills, night sweats, or unexplained weight loss. He sees Dr. Bertrum Brodie from pulmonary medicine for COPD. He has stable dyspnea on exertion. He sometimes may have intermittent cough which is unchanged. He uses honey if needed. He states his pulmonologist prescribed a steroid for him recently. He also has inhalers. He denies any hemoptysis or chest pain.  He denies any nausea, vomiting, diarrhea, or constipation.  Denies any headache. He thinks his vision is not as good. He is here today for evaluation to review his scan results.     MEDICAL HISTORY: Past Medical History:  Diagnosis Date   Alcoholic (HCC)    Bipolar 1 disorder (HCC)    Depression    Diabetes mellitus without complication (HCC)    Per pt he is pre-diabetic   Homeless    Schizoaffective schizophrenia (HCC)     ALLERGIES:  has no known allergies.  MEDICATIONS:  Current Outpatient Medications  Medication Sig Dispense Refill   acetaminophen  (TYLENOL ) 500 MG tablet Take 500-1,000 mg by mouth every 6 (six) hours as needed for moderate pain (pain score 4-6).     albuterol  (VENTOLIN  HFA) 108 (90 Base) MCG/ACT inhaler Inhale 2 puffs into the lungs every 6 (six) hours  as needed for wheezing or shortness of breath. 8 g 6   benzonatate  (TESSALON ) 100 MG capsule Take 1 capsule (100 mg total) by mouth 3 (three) times daily as needed. (Patient not taking: Reported on 01/19/2024) 90 capsule 2   budeson-glycopyrrolate-formoterol  (BREZTRI  AEROSPHERE) 160-9-4.8 MCG/ACT AERO inhaler Inhale 2 puffs into the lungs in the morning and at bedtime. 10.7 g 12   budeson-glycopyrrolate-formoterol  (BREZTRI  AEROSPHERE) 160-9-4.8 MCG/ACT  AERO inhaler Inhale 2 puffs into the lungs in the morning and at bedtime.     budesonide -formoterol  (SYMBICORT ) 80-4.5 MCG/ACT inhaler Inhale 2 puffs into the lungs in the morning and at bedtime. 1 each 11   cetirizine  (ZYRTEC ) 10 MG tablet Take 10 mg by mouth daily as needed for allergies.     dexamethasone  (DECADRON ) 4 MG tablet Take 1 tab 2 times daily starting day before pemetrexed . Then take 2 tabs daily x 3 days starting day after cisplatin . Take with food. (Patient not taking: Reported on 01/19/2024) 30 tablet 1   folic acid  (FOLVITE ) 1 MG tablet Take 1 tablet (1 mg total) by mouth daily. Start 7 days before pemetrexed  chemotherapy. Continue until 21 days after pemetrexed  completed. (Patient not taking: Reported on 01/19/2024) 100 tablet 3   gabapentin  (NEURONTIN ) 300 MG capsule Take 1 capsule (300 mg total) by mouth 2 (two) times daily. (Patient not taking: Reported on 01/19/2024) 60 capsule 0   ibuprofen  (ADVIL ) 200 MG tablet Take 400 mg by mouth every 6 (six) hours as needed for moderate pain (pain score 4-6).     indomethacin  (INDOCIN ) 50 MG capsule Take 1 capsule (50 mg total) by mouth 3 (three) times daily with meals. (Patient taking differently: Take 50 mg by mouth 3 (three) times daily as needed (gout).) 50 capsule 2   magnesium  oxide (MAG-OX) 400 (240 Mg) MG tablet Take 1 tablet (400 mg total) by mouth daily. (Patient not taking: Reported on 01/19/2024) 30 tablet 02   methocarbamol  (ROBAXIN ) 500 MG tablet Take 1 tablet (500 mg total) by mouth every 6 (six) hours as needed for muscle spasms. (Patient not taking: Reported on 01/19/2024) 28 tablet 0   methylPREDNISolone  (MEDROL  DOSEPAK) 4 MG TBPK tablet Use as instructed (Patient not taking: Reported on 01/19/2024) 21 tablet 0   prochlorperazine  (COMPAZINE ) 10 MG tablet Take 1 tablet (10 mg total) by mouth every 6 (six) hours as needed for nausea or vomiting. (Patient not taking: Reported on 01/19/2024) 30 tablet 1   No current  facility-administered medications for this visit.    SURGICAL HISTORY:  Past Surgical History:  Procedure Laterality Date   COLONOSCOPY  07/16/2023   DENTAL SURGERY     wisdom teeth   INTERCOSTAL NERVE BLOCK Left 07/23/2023   Procedure: INTERCOSTAL NERVE BLOCK;  Surgeon: Zelphia Higashi, MD;  Location: [facility] OR;  Service: Thoracic;  Laterality: Left;   LYMPH NODE BIOPSY Left 07/23/2023   Procedure: LYMPH NODE BIOPSY;  Surgeon: Zelphia Higashi, MD;  Location: [facility] OR;  Service: Thoracic;  Laterality: Left;    REVIEW OF SYSTEMS:   Review of Systems  Constitutional: positive for fatigue. Positive for improving appetite and weight gain. Negative for chills, fever and unexpected weight change.  HENT: Positive for decreased hearing. Negative for mouth sores, nosebleeds, sore throat and trouble swallowing.   Eyes: Positive for visual changes. Negative for eye problems and icterus.  Respiratory: Positive for stable dyspnea on exertion and occasional cough. Negative for hemoptysis and wheezing.   Cardiovascular: Negative for chest pain and leg  swelling.  Gastrointestinal: Negative for abdominal pain, constipation, diarrhea, nausea and vomiting.  Genitourinary: Negative for bladder incontinence, difficulty urinating, dysuria, frequency and hematuria.   Musculoskeletal: Positive for left rib pain from prior surgery. Negative for back pain, gait problem, neck pain and neck stiffness.  Skin: Positive for dry skin and mild rash on abdomen.  Neurological: Negative for dizziness, extremity weakness, gait problem, headaches, light-headedness and seizures.  Hematological: Negative for adenopathy. Does not bruise/bleed easily.  Psychiatric/Behavioral: Negative for confusion, depression and sleep disturbance. The patient is not nervous/anxious.     PHYSICAL EXAMINATION:  There were no vitals taken for this visit.  ECOG PERFORMANCE STATUS: 1  Physical Exam  Constitutional: Oriented to person,  place, and time and thin appearing male and in no distress.  HENT:  Head: Normocephalic and atraumatic.  Mouth/Throat: Oropharynx is clear and moist. No oropharyngeal exudate.  Eyes: Conjunctivae are normal. Right eye exhibits no discharge. Left eye exhibits no discharge. No scleral icterus.  Neck: Normal range of motion. Neck supple.  Cardiovascular: Normal rate, regular rhythm, normal heart sounds and intact distal pulses.   Pulmonary/Chest: Effort normal and breath sounds normal. No respiratory distress. No wheezes. No rales.  Abdominal: Soft. Bowel sounds are normal. Exhibits no distension and no mass. There is no tenderness.  Musculoskeletal: Normal range of motion. Exhibits no edema.  Lymphadenopathy:    No cervical adenopathy.  Neurological: Alert and oriented to person, place, and time. Exhibits muscle wasting. Gait normal. Coordination normal.  Skin: Skin is warm and dry. Dry skin on abdomen and very mild faint rash. Not diaphoretic. No erythema. No pallor.  Psychiatric: Mood, memory and judgment normal.  Vitals reviewed.  LABORATORY DATA: Lab Results  Component Value Date   WBC 14.0 (H) 01/05/2024   HGB 11.0 (L) 01/05/2024   HCT 32.9 (L) 01/05/2024   MCV 93.5 01/05/2024   PLT 493 (H) 01/05/2024      Chemistry      Component Value Date/Time   NA 137 01/05/2024 1144   K 3.9 01/05/2024 1144   CL 97 (L) 01/05/2024 1144   CO2 27 01/05/2024 1144   BUN 18 01/05/2024 1144   CREATININE 1.43 (H) 01/05/2024 1144   CREATININE 1.09 03/24/2023 0000      Component Value Date/Time   CALCIUM 9.9 01/05/2024 1144   ALKPHOS 105 01/05/2024 1144   AST 19 01/05/2024 1144   ALT 7 01/05/2024 1144   BILITOT 0.6 01/05/2024 1144       RADIOGRAPHIC STUDIES:  NM PET Image Restage (PS) Skull Base to Thigh (F-18 FDG) Result Date: 01/28/2024 CLINICAL DATA:  Subsequent treatment strategy for non-small cell lung cancer. EXAM: NUCLEAR MEDICINE PET SKULL BASE TO THIGH TECHNIQUE: 6.7 mCi F-18  FDG was injected intravenously. Full-ring PET imaging was performed from the skull base to thigh after the radiotracer. CT data was obtained and used for attenuation correction and anatomic localization. Fasting blood glucose: 102 mg/dl COMPARISON:  29/56/2130 and CT chest 01/07/2024 FINDINGS: Mediastinal blood pool activity: SUV max 2.7 Liver activity: SUV max NA NECK: No significant abnormal hypermetabolic activity in this region. Incidental CT findings: Small circle metal foreign body along the deep portion of the left parotid gland. Bilateral common carotid atheromatous vascular calcifications. CHEST: Interval left upper lobectomy. The 2.5 by 1.3 cm oval-shaped low-density lesion in the central left lower lobe has a maximum SUV of 1.2 accordingly is not suspicious for malignancy. New accentuated activity along the upper portion of the interatrial septum, maximum SUV  7.2, probably benign/incidental. Incidental CT findings: Coronary, aortic arch, and branch vessel atherosclerotic vascular disease. Mild scarring at the right lung apex. ABDOMEN/PELVIS: No significant abnormal hypermetabolic activity in this region. Incidental CT findings: Atherosclerosis is present, including aortoiliac atherosclerotic disease. Descending and sigmoid colon diverticulosis. Again observed focal outpouching along the abdominal aorta and a somewhat saccular manner resulting in a 3.5 cm aneurysm on image 147 series 4, similar to prior. SKELETON: No significant abnormal hypermetabolic activity in this region. Incidental CT findings: None. IMPRESSION: 1. Interval left upper lobectomy. The 2.5 by 1.3 cm oval-shaped low-density lesion in the central left lower lobe has a maximum SUV of 1.2 accordingly is not suspicious for malignancy. 2. New accentuated activity along the upper portion of the interatrial septum, maximum SUV 7.2, probably benign/incidental given the location. 3. Coronary, aortic arch, and branch vessel atherosclerotic  vascular disease. Aortic Atherosclerosis (ICD10-I70.0). 4. Descending and sigmoid colon diverticulosis. 5. Stable 3.5 cm abdominal aortic aneurysm. Given the focal/saccular nature of this aneurysm, referral to a vascular specialist for further workup is recommended if not already performed. Electronically Signed   By: Freida Jes M.D.   On: 01/28/2024 16:08     ASSESSMENT/PLAN:  This is a very pleasant 63 years old white male with Stage IIb (T3, N0, M0) non-small cell lung cancer, adenocarcinoma diagnosed in October 2024 The patient is status post left upper lobectomy with lymph node dissection under the care of Dr. Luna Salinas on July 23, 2023.  The tumor measured 1.3 cm with extension into the attached parietal pleural adipose tissue.  He underwent  adjuvant systemic chemotherapy with cisplatin  75 Mg/M2 and Alimta 500 Mg/M2 every 3 weeks.  First dose October 05, 2022 status post 4 cycles. This was completed in March 2025.   He had a repeat CT scan following his treatment which showed new oblong spiculated low-density nodule in the left lung.  Therefore Dr. Marguerita Shih recommended a PET scan to further evaluate this.  The patient was seen with Dr. Marguerita Shih today.  Dr. Marguerita Shih personally and independently reviewed the scan and discussed the results with the patient today.  The scan did not show any hypermetabolic activity and is therefore not suspicious for malignancy.  Dr. Marguerita Shih recommends he continue on observation with repeat imaging in 6 months with a CT scan.   However the scan did mention aneurysm and recommends referral to vascular. I have placed the referral. Discussed rupture risk. Refer to vascular specialist for evaluation and management. Advise emergency care for severe abdominal or back pain. They were advised to contact our office if they do not hear from vascular office in the next week or so. The scan also mentioned new accentuated activity along the upper portion of the  interatrial septum and radiology felt this is benign/incidental given the location.   We will see the patient back 1 week later after his next CT to review the results in the office.  He will continue with the recommendations by his pulmonologist for his COPD management. The patient continues to smoke and had cigarettes in his pocket during the appointment. Dr. Marguerita Shih encouraged the patient to quit smoking. The patient is aware of the recommendation but is not interested in quitting at this time.   The patient is not interested in gabapentin  for his post surgical continued nerve pain.   Hearing loss Potentially related to chemotherapy. Discussed referral for evaluation to hearing specialist. The patient declined. He knows I would be happy to place the referral if he  changes his mind or symptoms worsen.   The patient was advised to call immediately if she has any concerning symptoms in the interval. The patient voices understanding of current disease status and treatment options and is in agreement with the current care plan. All questions were answered. The patient knows to call the clinic with any problems, questions or concerns. We can certainly see the patient much sooner if necessary   No orders of the defined types were placed in this encounter.   Beatryce Colombo L Abrar Koone, PA-C 02/09/24  ADDENDUM: Hematology/Oncology Attending: I had a face-to-face encounter with the patient today.  I reviewed his record, lab, scan and recommended his care plan.  This is a very send 63 years old white male with history of stage IIb non-small cell lung cancer, adenocarcinoma diagnosed in October 2024 status post left upper lobectomy with lymph node dissection followed by adjuvant systemic chemotherapy with cisplatin  and Alimta for 4 cycle completed on December 08, 2023 and he is currently on observation.  He was found on previous CT scan of the chest on January 07, 2024 to have new lung spiculated  low-density nodule measuring 2.7 x 1.4 cm suspicious for disease recurrence and a PET scan was recommended.  I ordered a PET scan which was performed on 01/22/2024 and it showed the 2.5 x 1.3 cm oval-shaped low-density lesion in the central left lower lobe has low maximal SUV of 1.2 and not suspicious for malignancy.  There was no other findings suspicious for malignancy on the scan.  He has a lot of cardiovascular issues and we recommended for him to see a vascular specialist. I recommended for the patient to continue on observation with repeat CT scan of the chest in 6 months. The patient was advised to call immediately if he has any other concerning symptoms in the interval. The total time spent in the appointment was 30 minutes including review of chart and various tests results, discussions about plan of care and coordination of care plan . Disclaimer: This note was dictated with voice recognition software. Similar sounding words can inadvertently be transcribed and may be missed upon review. Aurelio Blower, MD

## 2024-02-10 ENCOUNTER — Inpatient Hospital Stay: Payer: MEDICAID | Attending: Internal Medicine | Admitting: Physician Assistant

## 2024-02-10 ENCOUNTER — Inpatient Hospital Stay: Payer: MEDICAID

## 2024-02-10 VITALS — BP 156/98 | HR 98 | Temp 97.9°F | Resp 18 | Ht 72.0 in | Wt 142.1 lb

## 2024-02-10 DIAGNOSIS — C3412 Malignant neoplasm of upper lobe, left bronchus or lung: Secondary | ICD-10-CM

## 2024-02-10 DIAGNOSIS — Z85118 Personal history of other malignant neoplasm of bronchus and lung: Secondary | ICD-10-CM | POA: Diagnosis present

## 2024-02-10 DIAGNOSIS — H9190 Unspecified hearing loss, unspecified ear: Secondary | ICD-10-CM | POA: Insufficient documentation

## 2024-02-10 DIAGNOSIS — I729 Aneurysm of unspecified site: Secondary | ICD-10-CM | POA: Insufficient documentation

## 2024-02-10 DIAGNOSIS — I714 Abdominal aortic aneurysm, without rupture, unspecified: Secondary | ICD-10-CM

## 2024-02-11 NOTE — Progress Notes (Signed)
 Faxed referral to [facility] at [phone] with confirmation.

## 2024-02-12 ENCOUNTER — Other Ambulatory Visit: Payer: Self-pay

## 2024-02-18 NOTE — Progress Notes (Unsigned)
 Patient ID: [name], male   DOB: 07/13/1961, 62 y.o.   MRN: [id number]  Reason for Consult: No chief complaint on file.   Referred by Heilingoetter, Cassandr*  Subjective:  HPI [name] is a 63 y.o. male presenting for an incidental finding of a saccular infrarenal abdominal aortic aneurysm.  He has a history of lung cancer, adenocarcinoma diagnosed in October 2024.  This aneurysm was found on a PET/CT.  Underwent 4 cycles of adjuvant chemotherapy and was completed in March 2025. He denies any new or unusual abdominal or back pain.  Past Medical History:  Diagnosis Date   Alcoholic (HCC)    Bipolar 1 disorder (HCC)    Depression    Diabetes mellitus without complication (HCC)    Per pt he is pre-diabetic   Homeless    Schizoaffective schizophrenia (HCC)    Family History  Problem Relation Age of Onset   Colon cancer Neg Hx    Rectal cancer Neg Hx    Stomach cancer Neg Hx    Past Surgical History:  Procedure Laterality Date   COLONOSCOPY  07/16/2023   DENTAL SURGERY     wisdom teeth   INTERCOSTAL NERVE BLOCK Left 07/23/2023   Procedure: INTERCOSTAL NERVE BLOCK;  Surgeon: Zelphia Higashi, MD;  Location: [facility] OR;  Service: Thoracic;  Laterality: Left;   LYMPH NODE BIOPSY Left 07/23/2023   Procedure: LYMPH NODE BIOPSY;  Surgeon: Zelphia Higashi, MD;  Location: [facility] OR;  Service: Thoracic;  Laterality: Left;    Short Social History:  Social History   Tobacco Use   Smoking status: Every Day    Current packs/day: 1.00    Average packs/day: 1 pack/day for 47.4 years (47.4 ttl pk-yrs)    Types: Cigarettes    Start date: 1978   Smokeless tobacco: Never   Tobacco comments:    7 packs of cigarettes in a week. 05/13/23 [place]  Substance Use Topics   Alcohol use: Yes    No Known Allergies  Current Outpatient Medications  Medication Sig Dispense Refill   acetaminophen  (TYLENOL ) 500 MG tablet Take 500-1,000 mg by mouth every 6 (six) hours as  needed for moderate pain (pain score 4-6).     albuterol  (VENTOLIN  HFA) 108 (90 Base) MCG/ACT inhaler Inhale 2 puffs into the lungs every 6 (six) hours as needed for wheezing or shortness of breath. 8 g 6   benzonatate  (TESSALON ) 100 MG capsule Take 1 capsule (100 mg total) by mouth 3 (three) times daily as needed. (Patient not taking: Reported on 01/19/2024) 90 capsule 2   budeson-glycopyrrolate-formoterol  (BREZTRI  AEROSPHERE) 160-9-4.8 MCG/ACT AERO inhaler Inhale 2 puffs into the lungs in the morning and at bedtime. 10.7 g 12   budeson-glycopyrrolate-formoterol  (BREZTRI  AEROSPHERE) 160-9-4.8 MCG/ACT AERO inhaler Inhale 2 puffs into the lungs in the morning and at bedtime.     budesonide -formoterol  (SYMBICORT ) 80-4.5 MCG/ACT inhaler Inhale 2 puffs into the lungs in the morning and at bedtime. 1 each 11   cetirizine  (ZYRTEC ) 10 MG tablet Take 10 mg by mouth daily as needed for allergies.     dexamethasone  (DECADRON ) 4 MG tablet Take 1 tab 2 times daily starting day before pemetrexed . Then take 2 tabs daily x 3 days starting day after cisplatin . Take with food. (Patient not taking: Reported on 01/19/2024) 30 tablet 1   folic acid  (FOLVITE ) 1 MG tablet Take 1 tablet (1 mg total) by mouth daily. Start 7 days before pemetrexed  chemotherapy. Continue until 21 days after  pemetrexed  completed. (Patient not taking: Reported on 01/19/2024) 100 tablet 3   gabapentin  (NEURONTIN ) 300 MG capsule Take 1 capsule (300 mg total) by mouth 2 (two) times daily. (Patient not taking: Reported on 01/19/2024) 60 capsule 0   ibuprofen  (ADVIL ) 200 MG tablet Take 400 mg by mouth every 6 (six) hours as needed for moderate pain (pain score 4-6).     indomethacin  (INDOCIN ) 50 MG capsule Take 1 capsule (50 mg total) by mouth 3 (three) times daily with meals. (Patient taking differently: Take 50 mg by mouth 3 (three) times daily as needed (gout).) 50 capsule 2   magnesium  oxide (MAG-OX) 400 (240 Mg) MG tablet Take 1 tablet (400 mg total)  by mouth daily. (Patient not taking: Reported on 01/19/2024) 30 tablet 02   methocarbamol  (ROBAXIN ) 500 MG tablet Take 1 tablet (500 mg total) by mouth every 6 (six) hours as needed for muscle spasms. (Patient not taking: Reported on 01/19/2024) 28 tablet 0   methylPREDNISolone  (MEDROL  DOSEPAK) 4 MG TBPK tablet Use as instructed (Patient not taking: Reported on 01/19/2024) 21 tablet 0   prochlorperazine  (COMPAZINE ) 10 MG tablet Take 1 tablet (10 mg total) by mouth every 6 (six) hours as needed for nausea or vomiting. (Patient not taking: Reported on 01/19/2024) 30 tablet 1   No current facility-administered medications for this visit.    REVIEW OF SYSTEMS  All other systems reviewed and are negative  Objective:  Objective   There were no vitals filed for this visit. There is no height or weight on file to calculate BMI.  Physical Exam General: no acute distress Cardiac: hemodynamically stable Pulm: normal work of breathing Abdomen: non-tender, no pulsatile mass*** Neuro: alert, no focal deficit Extremities: no edema, cyanosis or wounds*** Vascular:   Right: ***  Left: ***  Data: PET Saccular infrarenal abdominal aortic aneurysm with a total aortic diameter measuring approximately 3.6 cm.  Stable from August 2024 to May 2025.     Assessment/Plan:   [name] is a 63 y.o. male with presenting for evaluation of a saccular abdominal aortic aneurysm.  He has adenocarcinoma of his left lung  and has beenundergoing treatment and has completed adjuvant chemotherapy.  We discussed that this should likely be treated given its saccular morphology, we will plan to obtain a dedicated CTA for surgical planning.  Follow-up in a few weeks once CTA is completed.  Recommendations to optimize cardiovascular risk: Abstinence from all tobacco products. Blood glucose control with goal A1c < 7%. Blood pressure control with goal blood pressure < 140/90 mmHg. Lipid reduction therapy with goal  LDL-C <100 mg/dL  Aspirin 81mg  PO QD.  Atorvastatin 40-80mg  PO QD (or other "high intensity" statin therapy).   Philipp Brawn MD [facility]

## 2024-02-19 ENCOUNTER — Ambulatory Visit: Payer: MEDICAID | Attending: Vascular Surgery | Admitting: Vascular Surgery

## 2024-02-19 ENCOUNTER — Encounter: Payer: Self-pay | Admitting: Vascular Surgery

## 2024-02-19 VITALS — BP 132/91 | HR 117 | Temp 98.0°F | Resp 18 | Ht 72.0 in | Wt 141.9 lb

## 2024-02-19 DIAGNOSIS — I7143 Infrarenal abdominal aortic aneurysm, without rupture: Secondary | ICD-10-CM | POA: Diagnosis present

## 2024-02-22 ENCOUNTER — Other Ambulatory Visit: Payer: Self-pay

## 2024-02-22 DIAGNOSIS — I7143 Infrarenal abdominal aortic aneurysm, without rupture: Secondary | ICD-10-CM

## 2024-02-24 ENCOUNTER — Encounter: Payer: Self-pay | Admitting: Vascular Surgery

## 2024-02-26 ENCOUNTER — Other Ambulatory Visit: Payer: MEDICAID

## 2024-03-03 ENCOUNTER — Ambulatory Visit
Admission: RE | Admit: 2024-03-03 | Discharge: 2024-03-03 | Disposition: A | Discharge status: Home / Self Care | Payer: MEDICAID | Source: Ambulatory Visit | Attending: Vascular Surgery | Admitting: Vascular Surgery

## 2024-03-03 DIAGNOSIS — I7143 Infrarenal abdominal aortic aneurysm, without rupture: Secondary | ICD-10-CM

## 2024-03-04 ENCOUNTER — Telehealth: Payer: Self-pay

## 2024-03-04 NOTE — Telephone Encounter (Signed)
 [name] called triage line asking to know the size of pt's aneurysm  Returned call and spoke to Kayla Part and [place]. They wanted to know how large the aneurysm found in his recent scan is.  Triage nurse did not feel like it was within her scope of practice to discuss this information.  They stated they had the information but lost it.  Didn't see a note from Dr. Susi Eric so that information was not given to the pt.  They were aware that another type of CT scan is being ordered.  Advised them to wait for the CTA to be authorized,scheduled and performed and to f/u with Dr. Susi Eric when they have all the information.

## 2024-03-07 ENCOUNTER — Other Ambulatory Visit: Payer: Self-pay

## 2024-03-07 DIAGNOSIS — I7143 Infrarenal abdominal aortic aneurysm, without rupture: Secondary | ICD-10-CM

## 2024-03-07 NOTE — Telephone Encounter (Signed)
 Error (See Note 03/04/24 @ 1433

## 2024-03-11 ENCOUNTER — Ambulatory Visit: Payer: MEDICAID | Admitting: Vascular Surgery

## 2024-03-15 ENCOUNTER — Ambulatory Visit (HOSPITAL_COMMUNITY)
Admission: RE | Admit: 2024-03-15 | Discharge: 2024-03-15 | Disposition: A | Discharge status: Home / Self Care | Payer: MEDICAID | Source: Ambulatory Visit | Attending: Vascular Surgery | Admitting: Vascular Surgery

## 2024-03-15 DIAGNOSIS — I7143 Infrarenal abdominal aortic aneurysm, without rupture: Secondary | ICD-10-CM | POA: Diagnosis present

## 2024-03-15 LAB — POCT I-STAT CREATININE: Creatinine, Ser: 1.2 mg/dL (ref 0.61–1.24)

## 2024-03-15 MED ORDER — SODIUM CHLORIDE (PF) 0.9 % IJ SOLN
INTRAMUSCULAR | Status: AC
  Filled 2024-03-15: qty 50

## 2024-03-15 MED ORDER — IOHEXOL 350 MG/ML SOLN
100.0000 mL | Freq: Once | INTRAVENOUS | Status: AC | PRN
  Administered 2024-03-15: 100 mL via INTRAVENOUS

## 2024-03-23 ENCOUNTER — Ambulatory Visit: Payer: MEDICAID | Admitting: Nurse Practitioner

## 2024-04-06 NOTE — Progress Notes (Unsigned)
 Patient ID: GREGROY DOMBKOWSKI, male   DOB: 06/03/1961, 63 y.o.   MRN: [id number]  Reason for Consult: No chief complaint on file.   Referred by [name], MD  Subjective:     HPI TESHAUN OLARTE is a 63 y.o. male presenting for follow-up.  He had an incidental finding of a saccular infrarenal aortic aneurysm that was found on a PET CT scan.  At his last visit on 02/19/2024 explained that we would need a CTA for surgical planning.  He presents today for the results of that scan. He does have a history of adenocarcinoma of the lung and has completed chemotherapy in March 2025.  He continues to be asymptomatic from an aneurysm standpoint and denies any abdominal or back pain.  Past Medical History:  Diagnosis Date   AAA (abdominal aortic aneurysm) (HCC)    Alcoholic (HCC)    Bipolar 1 disorder (HCC)    Depression    Diabetes mellitus without complication (HCC)    Per pt he is pre-diabetic   Homeless    Schizoaffective schizophrenia (HCC)    Family History  Problem Relation Age of Onset   Colon cancer Neg Hx    Rectal cancer Neg Hx    Stomach cancer Neg Hx    Past Surgical History:  Procedure Laterality Date   COLONOSCOPY  07/16/2023   DENTAL SURGERY     wisdom teeth   INTERCOSTAL NERVE BLOCK Left 07/23/2023   Procedure: INTERCOSTAL NERVE BLOCK;  Surgeon: Kerrin Elspeth BROCKS, MD;  Location: [facility] OR;  Service: Thoracic;  Laterality: Left;   LYMPH NODE BIOPSY Left 07/23/2023   Procedure: LYMPH NODE BIOPSY;  Surgeon: Kerrin Elspeth BROCKS, MD;  Location: [facility] OR;  Service: Thoracic;  Laterality: Left;    Short Social History:  Social History   Tobacco Use   Smoking status: Every Day    Current packs/day: 1.00    Average packs/day: 1 pack/day for 47.5 years (47.5 ttl pk-yrs)    Types: Cigarettes    Start date: 1978   Smokeless tobacco: Never   Tobacco comments:    7 packs of cigarettes in a week. 05/13/23 Tay  Substance Use Topics   Alcohol use: Yes    No  Known Allergies  Current Outpatient Medications  Medication Sig Dispense Refill   acetaminophen  (TYLENOL ) 500 MG tablet Take 500-1,000 mg by mouth every 6 (six) hours as needed for moderate pain (pain score 4-6).     albuterol  (VENTOLIN  HFA) 108 (90 Base) MCG/ACT inhaler Inhale 2 puffs into the lungs every 6 (six) hours as needed for wheezing or shortness of breath. 8 g 6   benzonatate  (TESSALON ) 100 MG capsule Take 1 capsule (100 mg total) by mouth 3 (three) times daily as needed. (Patient not taking: Reported on 02/19/2024) 90 capsule 2   budeson-glycopyrrolate-formoterol  (BREZTRI  AEROSPHERE) 160-9-4.8 MCG/ACT AERO inhaler Inhale 2 puffs into the lungs in the morning and at bedtime. 10.7 g 12   budeson-glycopyrrolate-formoterol  (BREZTRI  AEROSPHERE) 160-9-4.8 MCG/ACT AERO inhaler Inhale 2 puffs into the lungs in the morning and at bedtime.     budesonide -formoterol  (SYMBICORT ) 80-4.5 MCG/ACT inhaler Inhale 2 puffs into the lungs in the morning and at bedtime. 1 each 11   cetirizine  (ZYRTEC ) 10 MG tablet Take 10 mg by mouth daily as needed for allergies.     dexamethasone  (DECADRON ) 4 MG tablet Take 1 tab 2 times daily starting day before pemetrexed . Then take 2 tabs daily x 3 days starting day after cisplatin .  Take with food. (Patient not taking: Reported on 02/19/2024) 30 tablet 1   folic acid  (FOLVITE ) 1 MG tablet Take 1 tablet (1 mg total) by mouth daily. Start 7 days before pemetrexed  chemotherapy. Continue until 21 days after pemetrexed  completed. (Patient not taking: Reported on 02/19/2024) 100 tablet 3   gabapentin  (NEURONTIN ) 300 MG capsule Take 1 capsule (300 mg total) by mouth 2 (two) times daily. (Patient not taking: Reported on 02/19/2024) 60 capsule 0   ibuprofen  (ADVIL ) 200 MG tablet Take 400 mg by mouth every 6 (six) hours as needed for moderate pain (pain score 4-6).     indomethacin  (INDOCIN ) 50 MG capsule Take 1 capsule (50 mg total) by mouth 3 (three) times daily with meals. (Patient  taking differently: Take 50 mg by mouth 3 (three) times daily as needed (gout).) 50 capsule 2   magnesium  oxide (MAG-OX) 400 (240 Mg) MG tablet Take 1 tablet (400 mg total) by mouth daily. (Patient not taking: Reported on 02/19/2024) 30 tablet 02   methocarbamol  (ROBAXIN ) 500 MG tablet Take 1 tablet (500 mg total) by mouth every 6 (six) hours as needed for muscle spasms. (Patient not taking: Reported on 02/19/2024) 28 tablet 0   methylPREDNISolone  (MEDROL  DOSEPAK) 4 MG TBPK tablet Use as instructed (Patient not taking: Reported on 02/19/2024) 21 tablet 0   prochlorperazine  (COMPAZINE ) 10 MG tablet Take 1 tablet (10 mg total) by mouth every 6 (six) hours as needed for nausea or vomiting. (Patient not taking: Reported on 02/19/2024) 30 tablet 1   No current facility-administered medications for this visit.    REVIEW OF SYSTEMS  All other systems were reviewed and are negative     Objective:  Objective   There were no vitals filed for this visit. There is no height or weight on file to calculate BMI.  Physical Exam General: no acute distress Cardiac: hemodynamically stable Pulm: normal work of breathing Abdomen: non-tender, no pulsatile mass palpated Neuro: alert, no focal deficit Extremities: no edema, cyanosis or wounds Vascular:   Right: Palpable femoral, radial  Left: Palpable femoral, radial  Data: CTA independently reviewed Irregular dilation of the infrarenal abdominal aorta with a maximal measurement of about 3.7 likely due to a saccular aneurysmal formation from a PAU     Assessment/Plan:   COSTAS SENA is a 63 y.o. male with presented for follow-up of an incidental finding of a saccular intrarenal aneurysm.  We discussed that this would likely be treated given the saccular morphology and obtained a CTA for surgical planning.  This demonstrates a 3.7 irregular dilation of the intrarenal abdominal aorta with anatomy amenable to endovascular repair.  Risks and benefits  of the EVAR were reviewed, he expressed understanding to proceed.  Recommendations to optimize cardiovascular risk: Abstinence from all tobacco products. Blood glucose control with goal A1c < 7%. Blood pressure control with goal blood pressure < 140/90 mmHg. Lipid reduction therapy with goal LDL-C <100 mg/dL  Aspirin 81mg  PO QD.  Atorvastatin 40-80mg  PO QD (or other high intensity statin therapy).   Norman GORMAN Serve MD [facility]

## 2024-04-08 ENCOUNTER — Ambulatory Visit: Payer: MEDICAID | Attending: Vascular Surgery | Admitting: Vascular Surgery

## 2024-04-08 ENCOUNTER — Encounter: Payer: Self-pay | Admitting: Vascular Surgery

## 2024-04-08 ENCOUNTER — Other Ambulatory Visit: Payer: Self-pay

## 2024-04-08 VITALS — BP 132/91 | HR 89 | Temp 98.0°F | Resp 20 | Ht 72.0 in | Wt 143.5 lb

## 2024-04-08 DIAGNOSIS — I7143 Infrarenal abdominal aortic aneurysm, without rupture: Secondary | ICD-10-CM | POA: Insufficient documentation

## 2024-04-08 DIAGNOSIS — I719 Aortic aneurysm of unspecified site, without rupture: Secondary | ICD-10-CM | POA: Insufficient documentation

## 2024-04-08 DIAGNOSIS — I671 Cerebral aneurysm, nonruptured: Secondary | ICD-10-CM

## 2024-05-06 ENCOUNTER — Encounter (HOSPITAL_COMMUNITY): Payer: Self-pay

## 2024-05-06 NOTE — Progress Notes (Signed)
 Surgical Instructions   Your procedure is scheduled on Tuesday May 10, 2024. Report to [facility] Main Entrance A at 5:30 A.M., then check in with the Admitting office. Any questions or running late day of surgery: call [phone]  Questions prior to your surgery date: call [phone], Monday-Friday, 8am-4pm. If you experience any cold or flu symptoms such as cough, fever, chills, shortness of breath, etc. between now and your scheduled surgery, please notify us  at the above number.     Remember:  Do not eat or drink after midnight the night before your surgery  Take these medicines the morning of surgery with A SIP OF WATER  budeson-glycopyrrolate-formoterol  (BREZTRI  AEROSPHERE) 160-9-4.8 MCG/ACT AERO inhaler  budesonide -formoterol  (SYMBICORT ) 80-4.5 MCG/ACT inhaler   May take these medicines IF NEEDED: acetaminophen  (TYLENOL )  albuterol  (VENTOLIN  HFA) 108 (90 Base) MCG/ACT inhaler Please bring with you to the hospital cetirizine  (ZYRTEC )  indomethacin  (INDOCIN )   One week prior to surgery, STOP taking any Aspirin  (unless otherwise instructed by your surgeon) Aleve, Naproxen, Ibuprofen , Motrin , Advil , Goody's, BC's, all herbal medications, fish oil, and non-prescription vitamins.   WHAT DO I DO ABOUT MY DIABETES MEDICATION?  Do not take oral diabetes medicines (pills) the morning of surgery.   The day of surgery, do not take other diabetes injectables, including Byetta (exenatide), Bydureon (exenatide ER), Victoza (liraglutide), or Trulicity (dulaglutide).  If your CBG is greater than 220 mg/dL, you may take  of your sliding scale (correction) dose of insulin.   HOW TO MANAGE YOUR DIABETES BEFORE AND AFTER SURGERY  Why is it important to control my blood sugar before and after surgery? Improving blood sugar levels before and after surgery helps healing and can limit problems. A way of improving blood sugar control is eating a healthy diet by:  Eating less sugar  and carbohydrates  Increasing activity/exercise  Talking with your doctor about reaching your blood sugar goals High blood sugars (greater than 180 mg/dL) can raise your risk of infections and slow your recovery, so you will need to focus on controlling your diabetes during the weeks before surgery. Make sure that the doctor who takes care of your diabetes knows about your planned surgery including the date and location.  How do I manage my blood sugar before surgery? Check your blood sugar at least 4 times a day, starting 2 days before surgery, to make sure that the level is not too high or low.  Check your blood sugar the morning of your surgery when you wake up and every 2 hours until you get to the Short Stay unit.  If your blood sugar is less than 70 mg/dL, you will need to treat for low blood sugar: Do not take insulin. Treat a low blood sugar (less than 70 mg/dL) with  cup of clear juice (cranberry or apple), 4 glucose tablets, OR glucose gel. Recheck blood sugar in 15 minutes after treatment (to make sure it is greater than 70 mg/dL). If your blood sugar is not greater than 70 mg/dL on recheck, call [phone] for further instructions. Report your blood sugar to the short stay nurse when you get to Short Stay.  If you are admitted to the hospital after surgery: Your blood sugar will be checked by the staff and you will probably be given insulin after surgery (instead of oral diabetes medicines) to make sure you have good blood sugar levels. The goal for blood sugar control after surgery is 80-180 mg/dL.  Do NOT Smoke (Tobacco/Vaping) for 24 hours prior to your procedure.  If you use a CPAP at night, you may bring your mask/headgear for your overnight stay.   You will be asked to remove any contacts, glasses, piercing's, hearing aid's, dentures/partials prior to surgery. Please bring cases for these items if needed.    Patients discharged the day of surgery  will not be allowed to drive home, and someone needs to stay with them for 24 hours.  SURGICAL WAITING ROOM VISITATION Patients may have no more than 2 support people in the waiting area - these visitors may rotate.   Pre-op nurse will coordinate an appropriate time for 1 ADULT support person, who may not rotate, to accompany patient in pre-op.  Children under the age of 3 must have an adult with them who is not the patient and must remain in the main waiting area with an adult.  If the patient needs to stay at the hospital during part of their recovery, the visitor guidelines for inpatient rooms apply.  Please refer to the [facility] website for the visitor guidelines for any additional information.   If you received a COVID test during your pre-op visit  it is requested that you wear a mask when out in public, stay away from anyone that may not be feeling well and notify your surgeon if you develop symptoms. If you have been in contact with anyone that has tested positive in the last 10 days please notify you surgeon.      Pre-operative CHG Bathing Instructions   You can play a key role in reducing the risk of infection after surgery. Your skin needs to be as free of germs as possible. You can reduce the number of germs on your skin by washing with CHG (chlorhexidine  gluconate) soap before surgery. CHG is an antiseptic soap that kills germs and continues to kill germs even after washing.   DO NOT use if you have an allergy to chlorhexidine /CHG or antibacterial soaps. If your skin becomes reddened or irritated, stop using the CHG and notify one of our RNs at [phone].              TAKE A SHOWER THE NIGHT BEFORE SURGERY AND THE DAY OF SURGERY    Please keep in mind the following:  You may shave your face before/day of surgery.  Place clean sheets on your bed the night before surgery Use a clean washcloth (not used since being washed) for each shower. DO NOT sleep with pet's night  before surgery.  CHG Shower Instructions:  Wash your face and private area with normal soap. If you choose to wash your hair, wash first with your normal shampoo.  After you use shampoo/soap, rinse your hair and body thoroughly to remove shampoo/soap residue.  Turn the water OFF and apply half the bottle of CHG soap to a CLEAN washcloth.  Apply CHG soap ONLY FROM YOUR NECK DOWN TO YOUR TOES (washing for 3-5 minutes)  DO NOT use CHG soap on face, private areas, open wounds, or sores.  Pay special attention to the area where your surgery is being performed.  If you are having back surgery, having someone wash your back for you may be helpful. Wait 2 minutes after CHG soap is applied, then you may rinse off the CHG soap.  Pat dry with a clean towel  Put on clean pajamas    Additional instructions for the day of surgery: DO NOT APPLY any lotions,  deodorants, cologne, or perfumes.   Do not wear jewelry  Do not bring valuables to the hospital. [facility] is not responsible for valuables/personal belongings. Put on clean/comfortable clothes.  Please brush your teeth.  Ask your nurse before applying any prescription medications to the skin.

## 2024-05-09 ENCOUNTER — Encounter (HOSPITAL_COMMUNITY): Payer: Self-pay

## 2024-05-09 ENCOUNTER — Encounter (HOSPITAL_COMMUNITY)
Admission: RE | Admit: 2024-05-09 | Discharge: 2024-05-09 | Disposition: A | Discharge status: Home / Self Care | Payer: MEDICAID | Source: Ambulatory Visit | Attending: Vascular Surgery | Admitting: Vascular Surgery

## 2024-05-09 ENCOUNTER — Other Ambulatory Visit: Payer: Self-pay

## 2024-05-09 VITALS — BP 169/84 | HR 80 | Temp 98.0°F | Resp 17 | Ht 72.0 in | Wt 148.0 lb

## 2024-05-09 DIAGNOSIS — I671 Cerebral aneurysm, nonruptured: Secondary | ICD-10-CM | POA: Insufficient documentation

## 2024-05-09 DIAGNOSIS — Z01812 Encounter for preprocedural laboratory examination: Secondary | ICD-10-CM | POA: Insufficient documentation

## 2024-05-09 DIAGNOSIS — Z01818 Encounter for other preprocedural examination: Secondary | ICD-10-CM

## 2024-05-09 LAB — TYPE AND SCREEN
ABO/RH(D): B NEG
Antibody Screen: NEGATIVE

## 2024-05-09 LAB — URINALYSIS, ROUTINE W REFLEX MICROSCOPIC
Bilirubin Urine: NEGATIVE
Glucose, UA: NEGATIVE mg/dL
Hgb urine dipstick: NEGATIVE
Ketones, ur: NEGATIVE mg/dL
Leukocytes,Ua: NEGATIVE
Nitrite: NEGATIVE
Protein, ur: NEGATIVE mg/dL
Specific Gravity, Urine: 1.008 (ref 1.005–1.030)
pH: 6 (ref 5.0–8.0)

## 2024-05-09 LAB — SURGICAL PCR SCREEN
MRSA, PCR: NEGATIVE
Staphylococcus aureus: NEGATIVE

## 2024-05-09 LAB — COMPREHENSIVE METABOLIC PANEL WITH GFR
ALT: 7 U/L (ref 0–44)
AST: 18 U/L (ref 15–41)
Albumin: 3.9 g/dL (ref 3.5–5.0)
Alkaline Phosphatase: 91 U/L (ref 38–126)
Anion gap: 9 (ref 5–15)
BUN: 17 mg/dL (ref 8–23)
CO2: 27 mmol/L (ref 22–32)
Calcium: 9.3 mg/dL (ref 8.9–10.3)
Chloride: 106 mmol/L (ref 98–111)
Creatinine, Ser: 1.36 mg/dL — ABNORMAL HIGH (ref 0.61–1.24)
GFR, Estimated: 58 mL/min — ABNORMAL LOW (ref >60)
Glucose, Bld: 81 mg/dL (ref 70–99)
Potassium: 3.8 mmol/L (ref 3.5–5.1)
Sodium: 142 mmol/L (ref 135–145)
Total Bilirubin: 0.5 mg/dL (ref 0.0–1.2)
Total Protein: 6.8 g/dL (ref 6.5–8.1)

## 2024-05-09 LAB — CBC
HCT: 44.6 % (ref 39.0–52.0)
Hemoglobin: 14.7 g/dL (ref 13.0–17.0)
MCH: 30.6 pg (ref 26.0–34.0)
MCHC: 33 g/dL (ref 30.0–36.0)
MCV: 92.9 fL (ref 80.0–100.0)
Platelets: 222 K/uL (ref 150–400)
RBC: 4.8 MIL/uL (ref 4.22–5.81)
RDW: 13.6 % (ref 11.5–15.5)
WBC: 8.8 K/uL (ref 4.0–10.5)
nRBC: 0 % (ref 0.0–0.2)

## 2024-05-09 LAB — PROTIME-INR
INR: 1 (ref 0.8–1.2)
Prothrombin Time: 13.2 s (ref 11.4–15.2)

## 2024-05-09 LAB — APTT: aPTT: 31 s (ref 24–36)

## 2024-05-09 NOTE — Progress Notes (Signed)
 PCP - [name], MD  Cardiologist -  Pulmonologist-Ramaswamy, Murali, MD   PPM/ICD - denies Device Orders - n/a Rep Notified - n/a  Chest x-ray - 09-08-23 EKG - 07-21-23 Stress Test - denies ECHO - denies Cardiac Cath - denies PFTS - 06-10-23  Sleep Study - denies CPAP - n/a  DM -per patient he is not diabetic  Blood Thinner Instructions:denies Aspirin  Instructions:n/a  ERAS Protcol -NPO   COVID TEST- n/a   Anesthesia review: no  Patient denies shortness of breath, fever, cough and chest pain at PAT appointment   All instructions explained to the patient, with a verbal understanding of the material. Patient agrees to go over the instructions while at home for a better understanding. Patient also instructed to self quarantine after being tested for COVID-19. The opportunity to ask questions was provided.

## 2024-05-09 NOTE — Anesthesia Preprocedure Evaluation (Signed)
 Anesthesia Evaluation  Patient identified by MRN, date of birth, ID band Patient awake    Reviewed: Allergy & Precautions, NPO status , Patient's Chart, lab work & pertinent test results  History of Anesthesia Complications Negative for: history of anesthetic complications  Airway Mallampati: I  TM Distance: >3 FB Neck ROM: Full    Dental  (+) Poor Dentition, Dental Advisory Given, Missing,    Pulmonary COPD (denies inhalers), Current Smoker (1 cigarette this AM)Patient did not abstain from smoking. 48 pack year history  1ppd    Pulmonary exam normal breath sounds clear to auscultation       Cardiovascular hypertension (153/89 preop, no home meds), Normal cardiovascular exam Rhythm:Regular Rate:Normal  CT A/P 03/15/24 IMPRESSION: 1. Irregular morphology of the infrarenal abdominal aorta with morphologically abnormal infrarenal segment possibly secondary to saccular aneurysm or penetrating atherosclerotic ulcer with superimposed overlying plaque formation. The largest diameter at this level is 3.7 cm using axial imaging. 2. 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 3. Bilateral inflow disease with moderate stenoses detailed above. 4. Incidental occlusion of the left SFA.   No prior echos on file   Neuro/Psych  PSYCHIATRIC DISORDERS  Depression Bipolar Disorder Schizophrenia  negative neurological ROS     GI/Hepatic negative GI ROS,,,(+)     substance abuse  alcohol use and marijuana useFew weeks since last alcohol   Endo/Other  diabetes (prediabetic)    Renal/GU Renal InsufficiencyRenal diseaseCr 1.36  negative genitourinary   Musculoskeletal negative musculoskeletal ROS (+)    Abdominal   Peds  Hematology negative hematology ROS (+) Hb 14.7, plt 222   Anesthesia Other Findings homelessness  Reproductive/Obstetrics negative OB ROS                               Anesthesia Physical Anesthesia Plan  ASA: 4  Anesthesia Plan: General   Post-op Pain Management: Tylenol  PO (pre-op)*   Induction: Intravenous  PONV Risk Score and Plan: 2 and Treatment may vary due to age or medical condition, Ondansetron , Dexamethasone  and Midazolam   Airway Management Planned: Oral ETT  Additional Equipment: Arterial line  Intra-op Plan:   Post-operative Plan: Extubation in OR  Informed Consent: I have reviewed the patients History and Physical, chart, labs and discussed the procedure including the risks, benefits and alternatives for the proposed anesthesia with the patient or authorized representative who has indicated his/her understanding and acceptance.     Dental advisory given  Plan Discussed with: CRNA  Anesthesia Plan Comments:          Anesthesia Quick Evaluation

## 2024-05-10 ENCOUNTER — Inpatient Hospital Stay (HOSPITAL_COMMUNITY): Payer: MEDICAID | Admitting: Anesthesiology

## 2024-05-10 ENCOUNTER — Other Ambulatory Visit: Payer: Self-pay

## 2024-05-10 ENCOUNTER — Inpatient Hospital Stay (HOSPITAL_COMMUNITY)
Admission: RE | Admit: 2024-05-10 | Discharge: 2024-05-11 | DRG: 269 | Disposition: A | Discharge status: Home / Self Care | Payer: MEDICAID | Attending: Vascular Surgery | Admitting: Vascular Surgery

## 2024-05-10 ENCOUNTER — Inpatient Hospital Stay (HOSPITAL_COMMUNITY): Payer: MEDICAID

## 2024-05-10 ENCOUNTER — Encounter (HOSPITAL_COMMUNITY): Payer: Self-pay | Admitting: Vascular Surgery

## 2024-05-10 ENCOUNTER — Encounter (HOSPITAL_COMMUNITY)
Admission: RE | Disposition: A | Discharge status: Home / Self Care | Payer: Self-pay | Source: Home / Self Care | Attending: Vascular Surgery

## 2024-05-10 DIAGNOSIS — E119 Type 2 diabetes mellitus without complications: Secondary | ICD-10-CM | POA: Diagnosis present

## 2024-05-10 DIAGNOSIS — I714 Abdominal aortic aneurysm, without rupture, unspecified: Secondary | ICD-10-CM | POA: Diagnosis present

## 2024-05-10 DIAGNOSIS — F17210 Nicotine dependence, cigarettes, uncomplicated: Secondary | ICD-10-CM | POA: Diagnosis present

## 2024-05-10 DIAGNOSIS — Z85118 Personal history of other malignant neoplasm of bronchus and lung: Secondary | ICD-10-CM | POA: Diagnosis not present

## 2024-05-10 DIAGNOSIS — Z9221 Personal history of antineoplastic chemotherapy: Secondary | ICD-10-CM

## 2024-05-10 DIAGNOSIS — I671 Cerebral aneurysm, nonruptured: Principal | ICD-10-CM | POA: Diagnosis present

## 2024-05-10 DIAGNOSIS — J449 Chronic obstructive pulmonary disease, unspecified: Secondary | ICD-10-CM

## 2024-05-10 DIAGNOSIS — I7143 Infrarenal abdominal aortic aneurysm, without rupture: Secondary | ICD-10-CM | POA: Diagnosis present

## 2024-05-10 DIAGNOSIS — F259 Schizoaffective disorder, unspecified: Secondary | ICD-10-CM | POA: Diagnosis present

## 2024-05-10 DIAGNOSIS — I10 Essential (primary) hypertension: Secondary | ICD-10-CM | POA: Diagnosis not present

## 2024-05-10 DIAGNOSIS — I722 Aneurysm of renal artery: Secondary | ICD-10-CM | POA: Diagnosis present

## 2024-05-10 DIAGNOSIS — Z7951 Long term (current) use of inhaled steroids: Secondary | ICD-10-CM

## 2024-05-10 DIAGNOSIS — I724 Aneurysm of artery of lower extremity: Secondary | ICD-10-CM

## 2024-05-10 HISTORY — PX: ULTRASOUND GUIDANCE FOR VASCULAR ACCESS: SHX6516

## 2024-05-10 HISTORY — PX: ABDOMINAL AORTIC ENDOVASCULAR STENT GRAFT: SHX5707

## 2024-05-10 LAB — PROTIME-INR
INR: 1.1 (ref 0.8–1.2)
Prothrombin Time: 15 s (ref 11.4–15.2)

## 2024-05-10 LAB — BASIC METABOLIC PANEL WITH GFR
Anion gap: 11 (ref 5–15)
BUN: 11 mg/dL (ref 8–23)
CO2: 25 mmol/L (ref 22–32)
Calcium: 8.7 mg/dL — ABNORMAL LOW (ref 8.9–10.3)
Chloride: 106 mmol/L (ref 98–111)
Creatinine, Ser: 1.13 mg/dL (ref 0.61–1.24)
GFR, Estimated: >60 mL/min (ref >60)
Glucose, Bld: 128 mg/dL — ABNORMAL HIGH (ref 70–99)
Potassium: 3.4 mmol/L — ABNORMAL LOW (ref 3.5–5.1)
Sodium: 142 mmol/L (ref 135–145)

## 2024-05-10 LAB — GLUCOSE, CAPILLARY
Glucose-Capillary: 89 mg/dL (ref 70–99)
Glucose-Capillary: 130 mg/dL — ABNORMAL HIGH (ref 70–99)

## 2024-05-10 LAB — CBC
HCT: 40.1 % (ref 39.0–52.0)
Hemoglobin: 12.9 g/dL — ABNORMAL LOW (ref 13.0–17.0)
MCH: 29.8 pg (ref 26.0–34.0)
MCHC: 32.2 g/dL (ref 30.0–36.0)
MCV: 92.6 fL (ref 80.0–100.0)
Platelets: 196 K/uL (ref 150–400)
RBC: 4.33 MIL/uL (ref 4.22–5.81)
RDW: 13.7 % (ref 11.5–15.5)
WBC: 10.4 K/uL (ref 4.0–10.5)
nRBC: 0 % (ref 0.0–0.2)

## 2024-05-10 LAB — APTT: aPTT: 81 s — ABNORMAL HIGH (ref 24–36)

## 2024-05-10 LAB — MAGNESIUM: Magnesium: 2.1 mg/dL (ref 1.7–2.4)

## 2024-05-10 SURGERY — INSERTION, ENDOVASCULAR STENT GRAFT, AORTA, ABDOMINAL
Anesthesia: General | Site: Groin

## 2024-05-10 MED ORDER — FENTANYL CITRATE (PF) 250 MCG/5ML IJ SOLN
INTRAMUSCULAR | Status: AC
Start: 2024-05-10 — End: 2024-05-10
  Filled 2024-05-10: qty 5

## 2024-05-10 MED ORDER — SODIUM CHLORIDE 0.9 % IV SOLN
250.0000 mL | INTRAVENOUS | Status: DC | PRN
Start: 2024-05-10 — End: 2024-05-11

## 2024-05-10 MED ORDER — PHENOL 1.4 % MT LIQD: 1.0000 | OROMUCOSAL | Status: DC | PRN

## 2024-05-10 MED ORDER — HYDROMORPHONE HCL 1 MG/ML IJ SOLN
0.2500 mg | INTRAMUSCULAR | Status: DC | PRN
  Administered 2024-05-10: 0.5 mg via INTRAVENOUS
  Administered 2024-05-10 (×2): 0.25 mg via INTRAVENOUS

## 2024-05-10 MED ORDER — LORATADINE 10 MG PO TABS
10.0000 mg | ORAL_TABLET | Freq: Every day | ORAL | Status: DC
  Administered 2024-05-11: 10 mg via ORAL
  Filled 2024-05-10 (×2): qty 1

## 2024-05-10 MED ORDER — DEXMEDETOMIDINE HCL IN NACL 80 MCG/20ML IV SOLN
INTRAVENOUS | Status: DC | PRN
  Administered 2024-05-10: 8 ug via INTRAVENOUS

## 2024-05-10 MED ORDER — PHENYLEPHRINE HCL-NACL 20-0.9 MG/250ML-% IV SOLN
INTRAVENOUS | Status: DC | PRN
  Administered 2024-05-10: 30 ug/min via INTRAVENOUS

## 2024-05-10 MED ORDER — PROPOFOL 10 MG/ML IV BOLUS
INTRAVENOUS | Status: AC
  Filled 2024-05-10: qty 20

## 2024-05-10 MED ORDER — ORAL CARE MOUTH RINSE: 15.0000 mL | Freq: Once | OROMUCOSAL | Status: AC

## 2024-05-10 MED ORDER — LACTATED RINGERS IV SOLN: INTRAVENOUS | Status: DC

## 2024-05-10 MED ORDER — ACETAMINOPHEN 650 MG RE SUPP: 325.0000 mg | RECTAL | Status: DC | PRN

## 2024-05-10 MED ORDER — SENNOSIDES-DOCUSATE SODIUM 8.6-50 MG PO TABS: 1.0000 | ORAL_TABLET | Freq: Every evening | ORAL | Status: DC | PRN

## 2024-05-10 MED ORDER — LABETALOL HCL 5 MG/ML IV SOLN
10.0000 mg | INTRAVENOUS | Status: DC | PRN
  Filled 2024-05-10: qty 4

## 2024-05-10 MED ORDER — DEXAMETHASONE SODIUM PHOSPHATE 10 MG/ML IJ SOLN
INTRAMUSCULAR | Status: AC
  Filled 2024-05-10: qty 1

## 2024-05-10 MED ORDER — METOPROLOL TARTRATE 5 MG/5ML IV SOLN: 2.5000 mg | INTRAVENOUS | Status: DC | PRN

## 2024-05-10 MED ORDER — POTASSIUM CHLORIDE CRYS ER 20 MEQ PO TBCR: 40.0000 meq | EXTENDED_RELEASE_TABLET | Freq: Every day | ORAL | Status: DC | PRN

## 2024-05-10 MED ORDER — ALBUTEROL SULFATE HFA 108 (90 BASE) MCG/ACT IN AERS
INHALATION_SPRAY | RESPIRATORY_TRACT | Status: DC | PRN
  Administered 2024-05-10: 8 via RESPIRATORY_TRACT

## 2024-05-10 MED ORDER — CHLORHEXIDINE GLUCONATE CLOTH 2 % EX PADS: 6.0000 | MEDICATED_PAD | Freq: Once | CUTANEOUS | Status: DC

## 2024-05-10 MED ORDER — MIDAZOLAM HCL 2 MG/2ML IJ SOLN
INTRAMUSCULAR | Status: AC
  Filled 2024-05-10: qty 2

## 2024-05-10 MED ORDER — IODIXANOL 320 MG/ML IV SOLN
INTRAVENOUS | Status: DC | PRN
  Administered 2024-05-10: 49.8 mL

## 2024-05-10 MED ORDER — ROCURONIUM BROMIDE 10 MG/ML (PF) SYRINGE
PREFILLED_SYRINGE | INTRAVENOUS | Status: AC
Start: 2024-05-10 — End: 2024-05-10
  Filled 2024-05-10: qty 10

## 2024-05-10 MED ORDER — HYDRALAZINE HCL 20 MG/ML IJ SOLN: 5.0000 mg | INTRAMUSCULAR | Status: DC | PRN

## 2024-05-10 MED ORDER — SODIUM CHLORIDE 0.9 % IV SOLN: INTRAVENOUS | Status: DC

## 2024-05-10 MED ORDER — HEPARIN SODIUM (PORCINE) 1000 UNIT/ML IJ SOLN
INTRAMUSCULAR | Status: DC | PRN
Start: 2024-05-10 — End: 2024-05-10
  Administered 2024-05-10: 6000 [IU] via INTRAVENOUS

## 2024-05-10 MED ORDER — ALBUTEROL SULFATE HFA 108 (90 BASE) MCG/ACT IN AERS
INHALATION_SPRAY | RESPIRATORY_TRACT | Status: AC
  Filled 2024-05-10: qty 6.7

## 2024-05-10 MED ORDER — DROPERIDOL 2.5 MG/ML IJ SOLN: 0.6250 mg | Freq: Once | INTRAMUSCULAR | Status: DC | PRN

## 2024-05-10 MED ORDER — ACETAMINOPHEN 500 MG PO TABS
1000.0000 mg | ORAL_TABLET | Freq: Once | ORAL | Status: AC
  Administered 2024-05-10: 1000 mg via ORAL
  Filled 2024-05-10: qty 2

## 2024-05-10 MED ORDER — SODIUM CHLORIDE 0.9% FLUSH: 3.0000 mL | INTRAVENOUS | Status: DC | PRN

## 2024-05-10 MED ORDER — ONDANSETRON HCL 4 MG/2ML IJ SOLN
INTRAMUSCULAR | Status: AC
  Filled 2024-05-10: qty 2

## 2024-05-10 MED ORDER — OXYCODONE HCL 5 MG PO TABS: 5.0000 mg | ORAL_TABLET | ORAL | Status: DC | PRN

## 2024-05-10 MED ORDER — CEFAZOLIN SODIUM-DEXTROSE 2-4 GM/100ML-% IV SOLN
2.0000 g | Freq: Three times a day (TID) | INTRAVENOUS | Status: AC
  Administered 2024-05-10 (×2): 2 g via INTRAVENOUS
  Filled 2024-05-10 (×2): qty 100

## 2024-05-10 MED ORDER — ROCURONIUM BROMIDE 10 MG/ML (PF) SYRINGE
PREFILLED_SYRINGE | INTRAVENOUS | Status: DC | PRN
  Administered 2024-05-10: 50 mg via INTRAVENOUS
  Administered 2024-05-10: 10 mg via INTRAVENOUS

## 2024-05-10 MED ORDER — LABETALOL HCL 5 MG/ML IV SOLN
INTRAVENOUS | Status: DC | PRN
  Administered 2024-05-10 (×2): 7.5 mg via INTRAVENOUS

## 2024-05-10 MED ORDER — HYDROMORPHONE HCL 1 MG/ML IJ SOLN
INTRAMUSCULAR | Status: AC
Start: 2024-05-10 — End: 2024-05-10
  Filled 2024-05-10: qty 1

## 2024-05-10 MED ORDER — LIDOCAINE 2% (20 MG/ML) 5 ML SYRINGE
INTRAMUSCULAR | Status: DC | PRN
  Administered 2024-05-10: 60 mg via INTRAVENOUS

## 2024-05-10 MED ORDER — BISACODYL 5 MG PO TBEC: 5.0000 mg | DELAYED_RELEASE_TABLET | Freq: Every day | ORAL | Status: DC | PRN

## 2024-05-10 MED ORDER — ROSUVASTATIN CALCIUM 5 MG PO TABS
10.0000 mg | ORAL_TABLET | Freq: Every day | ORAL | Status: DC
  Administered 2024-05-10 – 2024-05-11 (×2): 10 mg via ORAL
  Filled 2024-05-10 (×2): qty 2

## 2024-05-10 MED ORDER — HEPARIN 6000 UNIT IRRIGATION SOLUTION
Status: DC | PRN
  Administered 2024-05-10: 1

## 2024-05-10 MED ORDER — CHLORHEXIDINE GLUCONATE 0.12 % MT SOLN
15.0000 mL | Freq: Once | OROMUCOSAL | Status: AC
  Administered 2024-05-10: 15 mL via OROMUCOSAL

## 2024-05-10 MED ORDER — PHENYLEPHRINE 80 MCG/ML (10ML) SYRINGE FOR IV PUSH (FOR BLOOD PRESSURE SUPPORT)
PREFILLED_SYRINGE | INTRAVENOUS | Status: DC | PRN
  Administered 2024-05-10: 40 ug via INTRAVENOUS
  Administered 2024-05-10: 160 ug via INTRAVENOUS

## 2024-05-10 MED ORDER — MORPHINE SULFATE (PF) 2 MG/ML IV SOLN
2.0000 mg | INTRAVENOUS | Status: DC | PRN
  Administered 2024-05-10: 5 mg via INTRAVENOUS
  Administered 2024-05-10: 2.5 mg via INTRAVENOUS
  Administered 2024-05-10 – 2024-05-11 (×2): 4 mg via INTRAVENOUS
  Filled 2024-05-10 (×2): qty 2
  Filled 2024-05-10 (×2): qty 3

## 2024-05-10 MED ORDER — FENTANYL CITRATE (PF) 250 MCG/5ML IJ SOLN
INTRAMUSCULAR | Status: DC | PRN
  Administered 2024-05-10: 50 ug via INTRAVENOUS
  Administered 2024-05-10: 100 ug via INTRAVENOUS
  Administered 2024-05-10: 50 ug via INTRAVENOUS

## 2024-05-10 MED ORDER — CHLORHEXIDINE GLUCONATE 0.12 % MT SOLN
OROMUCOSAL | Status: AC
  Filled 2024-05-10: qty 15

## 2024-05-10 MED ORDER — ACETAMINOPHEN 325 MG PO TABS: 325.0000 mg | ORAL_TABLET | ORAL | Status: DC | PRN

## 2024-05-10 MED ORDER — ONDANSETRON HCL 4 MG/2ML IJ SOLN
INTRAMUSCULAR | Status: DC | PRN
  Administered 2024-05-10: 4 mg via INTRAVENOUS

## 2024-05-10 MED ORDER — PHENYLEPHRINE 80 MCG/ML (10ML) SYRINGE FOR IV PUSH (FOR BLOOD PRESSURE SUPPORT)
PREFILLED_SYRINGE | INTRAVENOUS | Status: AC
  Filled 2024-05-10: qty 10

## 2024-05-10 MED ORDER — DOCUSATE SODIUM 100 MG PO CAPS
100.0000 mg | ORAL_CAPSULE | Freq: Every day | ORAL | Status: DC
  Filled 2024-05-10: qty 1

## 2024-05-10 MED ORDER — LIDOCAINE 2% (20 MG/ML) 5 ML SYRINGE
INTRAMUSCULAR | Status: AC
Start: 2024-05-10 — End: 2024-05-10
  Filled 2024-05-10: qty 5

## 2024-05-10 MED ORDER — FLUTICASONE FUROATE-VILANTEROL 100-25 MCG/ACT IN AEPB
1.0000 | INHALATION_SPRAY | Freq: Every day | RESPIRATORY_TRACT | Status: DC
  Filled 2024-05-10: qty 28

## 2024-05-10 MED ORDER — SUGAMMADEX SODIUM 200 MG/2ML IV SOLN
INTRAVENOUS | Status: DC | PRN
  Administered 2024-05-10: 200 mg via INTRAVENOUS

## 2024-05-10 MED ORDER — DEXMEDETOMIDINE HCL IN NACL 80 MCG/20ML IV SOLN
INTRAVENOUS | Status: AC
Start: 2024-05-10 — End: 2024-05-10
  Filled 2024-05-10: qty 20

## 2024-05-10 MED ORDER — CEFAZOLIN SODIUM-DEXTROSE 2-4 GM/100ML-% IV SOLN
2.0000 g | INTRAVENOUS | Status: AC
  Administered 2024-05-10: 2 g via INTRAVENOUS
  Filled 2024-05-10: qty 100

## 2024-05-10 MED ORDER — DEXAMETHASONE SODIUM PHOSPHATE 10 MG/ML IJ SOLN
INTRAMUSCULAR | Status: DC | PRN
  Administered 2024-05-10: 10 mg via INTRAVENOUS

## 2024-05-10 MED ORDER — PROPOFOL 10 MG/ML IV BOLUS
INTRAVENOUS | Status: DC | PRN
  Administered 2024-05-10: 80 mg via INTRAVENOUS

## 2024-05-10 MED ORDER — SODIUM CHLORIDE 0.9% FLUSH
3.0000 mL | Freq: Two times a day (BID) | INTRAVENOUS | Status: DC
  Administered 2024-05-10 – 2024-05-11 (×2): 3 mL via INTRAVENOUS

## 2024-05-10 MED ORDER — HEPARIN 6000 UNIT IRRIGATION SOLUTION
Status: AC
Start: 2024-05-10 — End: 2024-05-10
  Filled 2024-05-10: qty 500

## 2024-05-10 MED ORDER — PANTOPRAZOLE SODIUM 40 MG PO TBEC
40.0000 mg | DELAYED_RELEASE_TABLET | Freq: Every day | ORAL | Status: DC
  Administered 2024-05-10 – 2024-05-11 (×2): 40 mg via ORAL
  Filled 2024-05-10 (×2): qty 1

## 2024-05-10 MED ORDER — MIDAZOLAM HCL 2 MG/2ML IJ SOLN
INTRAMUSCULAR | Status: DC | PRN
  Administered 2024-05-10: 2 mg via INTRAVENOUS

## 2024-05-10 MED ORDER — ASPIRIN 81 MG PO TBEC
81.0000 mg | DELAYED_RELEASE_TABLET | Freq: Every day | ORAL | Status: DC
  Administered 2024-05-11: 81 mg via ORAL
  Filled 2024-05-10: qty 1

## 2024-05-10 MED ORDER — HEPARIN SODIUM (PORCINE) 5000 UNIT/ML IJ SOLN
5000.0000 [IU] | Freq: Two times a day (BID) | INTRAMUSCULAR | Status: DC
  Administered 2024-05-11: 5000 [IU] via SUBCUTANEOUS
  Filled 2024-05-10: qty 1

## 2024-05-10 SURGICAL SUPPLY — 38 items
BAG COUNTER SPONGE SURGICOUNT (BAG) ×2 IMPLANT
BLADE CLIPPER SURG (BLADE) ×2 IMPLANT
CANISTER SUCTION 3000ML PPV (SUCTIONS) ×2 IMPLANT
CATH BEACON 5.038 65CM KMP-01 (CATHETERS) ×2 IMPLANT
CATH OMNI FLUSH .035X70CM (CATHETERS) ×2 IMPLANT
DERMABOND ADVANCED .7 DNX12 (GAUZE/BANDAGES/DRESSINGS) ×2 IMPLANT
DEVICE CLOSURE PERCLS PRGLD 6F (VASCULAR PRODUCTS) ×8 IMPLANT
DRSG TEGADERM 2-3/8X2-3/4 SM (GAUZE/BANDAGES/DRESSINGS) ×4 IMPLANT
ELECTRODE REM PT RTRN 9FT ADLT (ELECTROSURGICAL) ×2 IMPLANT
GAUZE SPONGE 2X2 8PLY STRL LF (GAUZE/BANDAGES/DRESSINGS) ×4 IMPLANT
GLIDEWIRE ADV .035X180CM (WIRE) ×2 IMPLANT
GLOVE BIOGEL PI IND STRL 7.0 (GLOVE) ×2 IMPLANT
GOWN STRL REUS W/ TWL LRG LVL3 (GOWN DISPOSABLE) ×4 IMPLANT
GOWN STRL REUS W/ TWL XL LVL3 (GOWN DISPOSABLE) ×4 IMPLANT
GRAFT BALLN CATH 65CM (BALLOONS) ×2 IMPLANT
GRAFT ENDOVASC ZTRAK 22X79 20F (Endovascular Graft) IMPLANT
KIT BASIN OR (CUSTOM PROCEDURE TRAY) ×2 IMPLANT
KIT DRAIN CSF ACCUDRAIN (MISCELLANEOUS) IMPLANT
KIT TURNOVER KIT B (KITS) ×2 IMPLANT
NS IRRIG 1000ML POUR BTL (IV SOLUTION) ×2 IMPLANT
PACK ENDOVASCULAR (PACKS) ×2 IMPLANT
PAD ARMBOARD POSITIONER FOAM (MISCELLANEOUS) ×4 IMPLANT
POWDER SURGICEL 3.0 GRAM (HEMOSTASIS) IMPLANT
SET MICROPUNCTURE 5F STIFF (MISCELLANEOUS) ×2 IMPLANT
SHEATH BRITE TIP 8FR 23CM (SHEATH) ×2 IMPLANT
SHEATH DRYSEAL FLEX 20FR 33CM (SHEATH) IMPLANT
SHEATH PINNACLE 8F 10CM (SHEATH) ×2 IMPLANT
STOPCOCK MORSE 400PSI 3WAY (MISCELLANEOUS) ×2 IMPLANT
SUT MNCRL AB 4-0 PS2 18 (SUTURE) ×2 IMPLANT
SUT PROLENE 5 0 C 1 24 (SUTURE) IMPLANT
SUT VIC AB 2-0 CT1 TAPERPNT 27 (SUTURE) IMPLANT
SUT VIC AB 3-0 SH 27X BRD (SUTURE) IMPLANT
SYR 20ML LL LF (SYRINGE) ×2 IMPLANT
TOWEL GREEN STERILE (TOWEL DISPOSABLE) ×2 IMPLANT
TRAY FOLEY MTR SLVR 16FR STAT (SET/KITS/TRAYS/PACK) ×2 IMPLANT
TUBING INJECTOR 48 (MISCELLANEOUS) ×2 IMPLANT
WIRE BENTSON .035X145CM (WIRE) ×4 IMPLANT
WIRE LUNDERQUIST .035X180CM (WIRE) ×4 IMPLANT

## 2024-05-10 NOTE — Anesthesia Postprocedure Evaluation (Signed)
 Anesthesia Post Note  Patient: [name]  Procedure(s) Performed: INSERTION, ENDOVASCULAR STENT GRAFT, AORTA, ABDOMINAL ULTRASOUND GUIDANCE, FOR VASCULAR ACCESS (Groin)     Patient location during evaluation: PACU Anesthesia Type: General Level of consciousness: awake and alert, oriented and patient cooperative Pain management: pain level controlled Vital Signs Assessment: post-procedure vital signs reviewed and stable Respiratory status: spontaneous breathing, nonlabored ventilation and respiratory function stable Cardiovascular status: blood pressure returned to baseline and stable Postop Assessment: no apparent nausea or vomiting Anesthetic complications: no   No notable events documented.  Last Vitals:  Vitals:   05/10/24 1100 05/10/24 1115  BP:  123/62  Pulse: (!) 53 73  Resp: 12 13  Temp:    SpO2: 90% 92%    Last Pain:  Vitals:   05/10/24 1115  TempSrc:   PainSc: Asleep                 Almarie CHRISTELLA Marchi

## 2024-05-10 NOTE — Plan of Care (Signed)

## 2024-05-10 NOTE — H&P (Signed)
 Patient seen and examined.  No complaints. No changes to medication history or physical exam since last seen. After discussing the risks and benefits of EVAR, [name] elected to proceed.   Norman GORMAN Serve MD    _________________________________________________________________________________________      Patient ID: [name], male   DOB: 05-18-1961, 63 y.o.   MRN: [id number]   Reason for Consult: Follow-up   Referred by Troy Atlas, MD   Subjective:    Subjective HPI [name] is a 63 y.o. male presenting for follow-up.  He had an incidental finding of a saccular infrarenal aortic aneurysm that was found on a PET CT scan.  At his last visit on 02/19/2024 explained that we would need a CTA for surgical planning.  He presents today for the results of that scan. He does have a history of adenocarcinoma of the lung and has completed chemotherapy in March 2025.  He continues to be asymptomatic from an aneurysm standpoint and denies any abdominal or back pain.       Past Medical History:  Diagnosis Date   AAA (abdominal aortic aneurysm) (HCC)     Alcoholic (HCC)     Bipolar 1 disorder (HCC)     Depression     Diabetes mellitus without complication (HCC)      Per pt he is pre-diabetic   Homeless     Schizoaffective schizophrenia (HCC)               Family History  Problem Relation Age of Onset   Colon cancer Neg Hx     Rectal cancer Neg Hx     Stomach cancer Neg Hx               Past Surgical History:  Procedure Laterality Date   COLONOSCOPY   07/16/2023   DENTAL SURGERY        wisdom teeth   INTERCOSTAL NERVE BLOCK Left 07/23/2023    Procedure: INTERCOSTAL NERVE BLOCK;  Surgeon: Kerrin Elspeth BROCKS, MD;  Location: [facility] OR;  Service: Thoracic;  Laterality: Left;   LYMPH NODE BIOPSY Left 07/23/2023    Procedure: LYMPH NODE BIOPSY;  Surgeon: Kerrin Elspeth BROCKS, MD;  Location: [facility] OR;  Service: Thoracic;  Laterality: Left;           Short Social History:  Social History         Tobacco Use   Smoking status: Every Day      Current packs/day: 1.00      Average packs/day: 1 pack/day for 47.5 years (47.5 ttl pk-yrs)      Types: Cigarettes      Start date: 1978   Smokeless tobacco: Never   Tobacco comments:      7 packs of cigarettes in a week. 05/13/23 Tay  Substance Use Topics   Alcohol use: Yes      Allergies  No Known Allergies           Current Outpatient Medications  Medication Sig Dispense Refill   acetaminophen  (TYLENOL ) 500 MG tablet Take 500-1,000 mg by mouth every 6 (six) hours as needed for moderate pain (pain score 4-6).       albuterol  (VENTOLIN  HFA) 108 (90 Base) MCG/ACT inhaler Inhale 2 puffs into the lungs every 6 (six) hours as needed for wheezing or shortness of breath. 8 g 6   budeson-glycopyrrolate-formoterol  (BREZTRI  AEROSPHERE) 160-9-4.8 MCG/ACT AERO inhaler Inhale 2 puffs into the lungs in the morning and at bedtime. 10.7 g 12  budeson-glycopyrrolate-formoterol  (BREZTRI  AEROSPHERE) 160-9-4.8 MCG/ACT AERO inhaler Inhale 2 puffs into the lungs in the morning and at bedtime.       budesonide -formoterol  (SYMBICORT ) 80-4.5 MCG/ACT inhaler Inhale 2 puffs into the lungs in the morning and at bedtime. 1 each 11   cetirizine  (ZYRTEC ) 10 MG tablet Take 10 mg by mouth daily as needed for allergies.       ibuprofen  (ADVIL ) 200 MG tablet Take 400 mg by mouth every 6 (six) hours as needed for moderate pain (pain score 4-6).       indomethacin  (INDOCIN ) 50 MG capsule Take 1 capsule (50 mg total) by mouth 3 (three) times daily with meals. (Patient taking differently: Take 50 mg by mouth 3 (three) times daily as needed (gout).) 50 capsule 2      No current facility-administered medications for this visit.        REVIEW OF SYSTEMS  All other systems were reviewed and are negative       Objective:    Objective[] Expand by Default    Vitals:    04/08/24 0823  BP: (!) 132/91  Pulse: 89  Resp:  20  Temp: 98 F (36.7 C)  TempSrc: Temporal  SpO2: 97%  Weight: 143 lb 8 oz (65.1 kg)  Height: 6' (1.829 m)    Body mass index is 19.46 kg/m.   Physical Exam General: no acute distress Cardiac: hemodynamically stable Pulm: normal work of breathing Abdomen: non-tender, no pulsatile mass palpated Neuro: alert, no focal deficit Extremities: no edema, cyanosis or wounds Vascular:              Right: Palpable femoral, radial             Left: Palpable femoral, radial   Data: CTA independently reviewed Irregular dilation of the infrarenal abdominal aorta with a maximal measurement of about 3.7 likely due to a saccular aneurysmal formation from a PAU      Assessment/Plan:    [name] is a 63 y.o. male with presented for follow-up of an incidental finding of a saccular intrarenal aneurysm.  We discussed that this would likely be treated given the saccular morphology and obtained a CTA for surgical planning.  This demonstrates a 3.7 irregular dilation of the intrarenal abdominal aorta with anatomy amenable to endovascular repair.   Risks and benefits of EVAR were reviewed, he expressed understanding to proceed.   Recommendations to optimize cardiovascular risk: Abstinence from all tobacco products. Blood glucose control with goal A1c < 7%. Blood pressure control with goal blood pressure < 140/90 mmHg. Lipid reduction therapy with goal LDL-C <100 mg/dL  Aspirin  81mg  PO QD.  Atorvastatin 40-80mg  PO QD (or other high intensity statin therapy).     Norman GORMAN Serve MD [facility]

## 2024-05-10 NOTE — Op Note (Signed)
    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  We reviewed risks and benefits of intervention including endovascular repair versus open repair as well as continued surveillance with best medical therapy.  Given his anatomy and medical comorbidities I offered EVAR.  We reviewed the specific risks of bleeding, renal obstruction leading to renal failure, bowel ischemia, access site complication necessitating a groin incision, rupture and death.  The patient expressed understanding and was willing to proceed.  DESCRIPTION: The patient was brought to the operating room and positioned supine on the OR table. The patient was prepped and draped in the usual sterile fashion. A timeout was performed and IV antibiotics were administered.  Percutaneous access of the left CFA was obtained with ultrasound guidance.  The access was dilated with an 8 French dilator and 2 perclose devices were placed in the pre-close technique and was then upsized to 8 [place] sheath over a bentson wire. The patient was systemically heparinized.  The pigtail catheter was placed in the access and aortogram was obtained.  This demonstrated widely patent renal arteries bilaterally and an adequate seal zone both proximally and distally to the saccular aneurysm.  A Lunderquist wire was then placed in the proximal  descending aorta and the pigtail catheter was removed.  The 8 French sheath was then exchanged for the [place] device, [id number].  This was deployed according to IFU with approximately 2 cm's of steal both proximally and distally.  This landed about 1 cm below the lowest renal and well above the aortic bifurcation.  This device was then removed over the Lunderquist wire and a 20 [place] Gore dry seal sheath was placed over this into the proximal left common iliac artery.  Pigtail was placed over the Lunderquist wire and an aortogram was obtained which demonstrated successful placement of the aortic stent graft and widely patent renal arteries and a preserved aortic bifurcation.  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 The arteriotomies were closed over a wire using the previously placed perc-close devices with excellent hemostasis. Pedal pulses were checked and found to be stable from pre-op. All counts were correct at the end of the procedure, he tolerated the procedure well and was transferred to recovery in stable condition.    COMPLICATIONS: None apparent  CONDITION: Stable  Norman GORMAN Serve MD [facility] Phone Number: [phone] 05/10/2024 9:14 AM

## 2024-05-10 NOTE — Transfer of Care (Signed)
 Immediate Anesthesia Transfer of Care Note  Patient: [name]  Procedure(s) Performed: INSERTION, ENDOVASCULAR STENT GRAFT, AORTA, ABDOMINAL ULTRASOUND GUIDANCE, FOR VASCULAR ACCESS (Groin)  Patient Location: PACU  Anesthesia Type:General  Level of Consciousness: awake, alert , and oriented  Airway & Oxygen Therapy: Patient Spontanous Breathing and Patient connected to face mask oxygen  Post-op Assessment: Report given to RN and Post -op Vital signs reviewed and stable  Post vital signs: Reviewed and stable  Last Vitals:  Vitals Value Taken Time  BP 79/61 05/10/24 09:22  Temp    Pulse 69 05/10/24 09:25  Resp 14 05/10/24 09:25  SpO2 97 % 05/10/24 09:25  Vitals shown include unfiled device data.  Last Pain:  Vitals:   05/10/24 0652  TempSrc:   PainSc: 0-No pain      Patients Stated Pain Goal: 0 (05/10/24 9357)  Complications: No notable events documented.

## 2024-05-10 NOTE — Anesthesia Procedure Notes (Signed)
 Arterial Line Insertion Start/End8/19/2025 7:16 AM, 05/10/2024 7:22 AM Performed by: CRNA  Patient location: Pre-op. Preanesthetic checklist: patient identified, IV checked, site marked, risks and benefits discussed, surgical consent, monitors and equipment checked, pre-op evaluation, timeout performed and anesthesia consent Lidocaine  1% used for infiltration Right, radial was placed Catheter size: 20 G Hand hygiene performed  and maximum sterile barriers used   Attempts: 1 Procedure performed using ultrasound guided technique. Ultrasound Notes:anatomy identified, needle tip was noted to be adjacent to the nerve/plexus identified and no ultrasound evidence of intravascular and/or intraneural injection Following insertion, dressing applied. Post procedure assessment: normal and unchanged

## 2024-05-10 NOTE — Anesthesia Procedure Notes (Signed)
 Procedure Name: Intubation Date/Time: 05/10/2024 7:51 AM  Performed by: Vera Rochele PARAS, CRNAPre-anesthesia Checklist: Patient identified, Emergency Drugs available, Suction available and Patient being monitored Patient Re-evaluated:Patient Re-evaluated prior to induction Oxygen Delivery Method: Circle System Utilized Preoxygenation: Pre-oxygenation with 100% oxygen Induction Type: IV induction Ventilation: Mask ventilation without difficulty Laryngoscope Size: Miller and 3 Grade View: Grade I Tube type: Oral Tube size: 7.5 mm Number of attempts: 1 Airway Equipment and Method: Stylet and Oral airway Placement Confirmation: ETT inserted through vocal cords under direct vision, positive ETCO2 and breath sounds checked- equal and bilateral Secured at: 23 cm Tube secured with: Tape Dental Injury: Teeth and Oropharynx as per pre-operative assessment

## 2024-05-10 NOTE — Progress Notes (Signed)
 BP 160/81 attempted to give prn labetal, patient refused, stated I meds his blood pressure go up.  Refused admission questions at this time, will try again later.

## 2024-05-11 ENCOUNTER — Encounter (HOSPITAL_COMMUNITY): Payer: Self-pay | Admitting: Vascular Surgery

## 2024-05-11 LAB — CBC
HCT: 37.7 % — ABNORMAL LOW (ref 39.0–52.0)
Hemoglobin: 12.2 g/dL — ABNORMAL LOW (ref 13.0–17.0)
MCH: 29.8 pg (ref 26.0–34.0)
MCHC: 32.4 g/dL (ref 30.0–36.0)
MCV: 92 fL (ref 80.0–100.0)
Platelets: 199 K/uL (ref 150–400)
RBC: 4.1 MIL/uL — ABNORMAL LOW (ref 4.22–5.81)
RDW: 13.7 % (ref 11.5–15.5)
WBC: 15.8 K/uL — ABNORMAL HIGH (ref 4.0–10.5)
nRBC: 0 % (ref 0.0–0.2)

## 2024-05-11 LAB — BASIC METABOLIC PANEL WITH GFR
Anion gap: 12 (ref 5–15)
BUN: 13 mg/dL (ref 8–23)
CO2: 23 mmol/L (ref 22–32)
Calcium: 9.2 mg/dL (ref 8.9–10.3)
Chloride: 103 mmol/L (ref 98–111)
Creatinine, Ser: 1.27 mg/dL — ABNORMAL HIGH (ref 0.61–1.24)
GFR, Estimated: >60 mL/min (ref >60)
Glucose, Bld: 121 mg/dL — ABNORMAL HIGH (ref 70–99)
Potassium: 4 mmol/L (ref 3.5–5.1)
Sodium: 138 mmol/L (ref 135–145)

## 2024-05-11 MED ORDER — ROSUVASTATIN CALCIUM 10 MG PO TABS: 10.0000 mg | ORAL_TABLET | Freq: Every day | ORAL | 11 refills | Status: DC

## 2024-05-11 MED ORDER — ASPIRIN 81 MG PO TBEC: 81.0000 mg | DELAYED_RELEASE_TABLET | Freq: Every day | ORAL | 12 refills | Status: AC

## 2024-05-11 NOTE — Discharge Summary (Signed)
 EVAR Discharge Summary   [name] 1961-04-20 63 y.o. male  MRN: [id number]  Admission Date: 05/10/2024  Discharge Date: 05/11/2024  Physician: No att. providers found  Admission Diagnosis: Saccular aneurysm [I67.1] Aortic aneurysm, abdominal [facility]) [I71.40]   Hospital Course:  The patient was admitted to the hospital and taken to the operating room on 05/10/2024 and underwent: ***    The pt tolerated the procedure well and was transported to the PACU in {DESC; GOOD/FAIR/POOR:18685} condition.   ***  The remainder of the hospital course consisted of increasing mobilization and increasing intake of solids without difficulty.  He remained stable for discharge home. He will resume home medications as prescribed. Prescriptions for Aspirin  81 mg and Rosuvastatin  10 mg were sent to his pharmacy. He will follow up with Dr. Pearline in 1 month with CTA EVAR protocol.   CBC    Component Value Date/Time   WBC 15.8 (H) 05/11/2024 0525   RBC 4.10 (L) 05/11/2024 0525   HGB 12.2 (L) 05/11/2024 0525   HGB 11.0 (L) 01/05/2024 1144   HCT 37.7 (L) 05/11/2024 0525   HCT 39.7 05/03/2017 1745   PLT 199 05/11/2024 0525   PLT 493 (H) 01/05/2024 1144   MCV 92.0 05/11/2024 0525   MCH 29.8 05/11/2024 0525   MCHC 32.4 05/11/2024 0525   RDW 13.7 05/11/2024 0525   LYMPHSABS 2.3 01/05/2024 1144   MONOABS 2.2 (H) 01/05/2024 1144   EOSABS 0.0 01/05/2024 1144   BASOSABS 0.1 01/05/2024 1144    BMET    Component Value Date/Time   NA 138 05/11/2024 0525   K 4.0 05/11/2024 0525   CL 103 05/11/2024 0525   CO2 23 05/11/2024 0525   GLUCOSE 121 (H) 05/11/2024 0525   BUN 13 05/11/2024 0525   CREATININE 1.27 (H) 05/11/2024 0525   CREATININE 1.43 (H) 01/05/2024 1144   CREATININE 1.09 03/24/2023 0000   CALCIUM  9.2 05/11/2024 0525   GFRNONAA >60 05/11/2024 0525   GFRNONAA 55 (L) 01/05/2024 1144   GFRAA >60 03/11/2018 0903       Discharge Instructions     ABDOMINAL  PROCEDURE/ANEURYSM REPAIR/AORTO-BIFEMORAL BYPASS:  Call MD for increased abdominal pain; cramping diarrhea; nausea/vomiting   Complete by: As directed    Call MD for:  redness, tenderness, or signs of infection (pain, swelling, bleeding, redness, odor or green/yellow discharge around incision site)   Complete by: As directed    Call MD for:  severe or increased pain, loss or decreased feeling  in affected limb(s)   Complete by: As directed    Call MD for:  temperature >100.5   Complete by: As directed    Driving Restrictions   Complete by: As directed    No driving for 2 days   Increase activity slowly   Complete by: As directed    Walk with assistance use walker or cane as needed   Lifting restrictions   Complete by: As directed    No heavy lifting, pushing, pulling for 2 weeks   May shower    Complete by: As directed    Remove dressing in 24 hours   Complete by: As directed    Resume previous diet   Complete by: As directed        Discharge Diagnosis:  Saccular aneurysm [I67.1] Aortic aneurysm, abdominal (HCC) [I71.40]  Secondary Diagnosis: Patient Active Problem List   Diagnosis Date Noted   Saccular aneurysm 05/10/2024   Aortic aneurysm, abdominal (HCC) 05/10/2024   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) 09/29/2023   Lung nodule 07/23/2023   S/P lobectomy of lung 07/23/2023   Alcoholic intoxication without complication (HCC)    Suicidal thoughts    MDD (major depressive disorder), recurrent episode, severe (HCC) 04/30/2017   HTN (hypertension) 04/01/2016   Homeless single person 04/01/2016   Tobacco abuse disorder 04/01/2016   Past Medical History:  Diagnosis Date   AAA (abdominal aortic aneurysm) (HCC)    Alcoholic (HCC)    Bipolar 1 disorder (HCC)    Depression    Diabetes mellitus without complication (HCC)    Per pt he is pre-diabetic (05-09-24 per patient he is not diabetic)   Homeless    Schizoaffective schizophrenia (HCC)      Allergies as  of 05/11/2024   No Known Allergies      Medication List     TAKE these medications    acetaminophen  500 MG tablet Commonly known as: TYLENOL  Take 1,000 mg by mouth every 6 (six) hours as needed for moderate pain (pain score 4-6).   albuterol  108 (90 Base) MCG/ACT inhaler Commonly known as: VENTOLIN  HFA Inhale 2 puffs into the lungs every 6 (six) hours as needed for wheezing or shortness of breath.   aspirin  EC 81 MG tablet Take 1 tablet (81 mg total) by mouth daily at 6 (six) AM. Swallow whole. Start taking on: May 12, 2024   budesonide -formoterol  80-4.5 MCG/ACT inhaler Commonly known as: Symbicort  Inhale 2 puffs into the lungs in the morning and at bedtime. What changed:  when to take this reasons to take this   cetirizine  10 MG tablet Commonly known as: ZYRTEC  Take 10 mg by mouth every 12 (twelve) hours as needed for allergies.   ibuprofen  200 MG tablet Commonly known as: ADVIL  Take 400 mg by mouth 2 (two) times daily as needed for moderate pain (pain score 4-6) or headache.   indomethacin  50 MG capsule Commonly known as: INDOCIN  Take 1 capsule (50 mg total) by mouth 3 (three) times daily with meals. What changed:  when to take this reasons to take this   rosuvastatin  10 MG tablet Commonly known as: CRESTOR  Take 1 tablet (10 mg total) by mouth daily. Start taking on: May 12, 2024        Discharge Instructions:   [facility]  Discharge Instructions Endovascular Aortic Aneurysm Repair  Please refer to the following instructions for your post-procedure care. Your surgeon or Physician Assistant will discuss any changes with you.  Activity  You are encouraged to walk as much as you can. You can slowly return to normal activities but must avoid strenuous activity and heavy lifting until your doctor tells you it's OK. Avoid activities such as vacuuming or swinging a gold club. It is normal to feel tired for several weeks  after your surgery. Do not drive until your doctor gives the OK and you are no longer taking prescription pain medications. It is also normal to have difficulty with sleep habits, eating, and bowel movements after surgery. These will go away with time.  Bathing/Showering  You may shower after you go home. If you have an incision, do not soak in a bathtub, hot tub, or swim until the incision heals completely.  Incision Care  Shower every day. Clean your incision with mild soap and water. Pat the area dry with a clean towel. You do not need a bandage unless otherwise instructed. Do not apply any ointments or creams to your incision. If you  clothing is irritating, you may cover your incision with a dry gauze pad.  Diet  Resume your normal diet. There are no special food restrictions following this procedure. A low fat/low cholesterol diet is recommended for all patients with vascular disease. In order to heal from your surgery, it is CRITICAL to get adequate nutrition. Your body requires vitamins, minerals, and protein. Vegetables are the best source of vitamins and minerals. Vegetables also provide the perfect balance of protein. Processed food has little nutritional value, so try to avoid this.  Medications  Resume taking all of your medications unless your doctor or Physician Assistnat tells you not to. If your incision is causing pain, you may take over-the-counter pain relievers such as acetaminophen  (Tylenol ). If you were prescribed a stronger pain medication, please be aware these medications can cause nausea and constipation. Prevent nausea by taking the medication with a snack or meal. Avoid constipation by drinking plenty of fluids and eating foods with a high amount of fiber, such as fruits, vegetables, and grains. Do not take Tylenol  if you are taking prescription pain medications.   Follow up  Our office will schedule a follow-up appointment with a C.T. scan 3-4 weeks after your  surgery.  Please call us  immediately for any of the following conditions  Severe or worsening pain in your legs or feet or in your abdomen back or chest. Increased pain, redness, drainage (pus) from your incision sit. Increased abdominal pain, bloating, nausea, vomiting or persistent diarrhea. Fever of 101 degrees or higher. Swelling in your leg (s),  Reduce your risk of vascular disease  Stop smoking. If you would like help call [facility] at 1-800-QUIT-NOW ((239) 649-5142) or [facility] at (231) 560-9628. Manage your cholesterol Maintain a desired weight Control your diabetes Keep your blood pressure down  If you have questions, please call the office at [phone].    Prescriptions given: Aspirin  81 mg #30 11 refills Rosuvastatin  10 mg #30 11 refills  Disposition: Home  Patient's condition: is Good  Follow up: 1. Dr. Pearline in 4 weeks with CTA protocol   Teretha Damme, PA-C [facility] [phone] 05/11/2024  4:22 PM   - For VQI Registry use - Post-op:  Time to Extubation: [X]  In OR, [ ]  < 12 hrs, [ ]  12-24 hrs, [ ]  >=24 hrs Vasopressors Req. Post-op: No MI: No., [ ]  Troponin only, [ ]  EKG or Clinical New Arrhythmia: No CHF: No ICU Stay: 0 days  Transfusion: No     If yes, 0 units given  Complications: Resp failure: No., [ ]  Pneumonia, [ ]  Ventilator Chg in renal function: No., [ ]  Inc. Cr > 0.5, [ ]  Temp. Dialysis,  [ ]  Permanent dialysis Leg ischemia: No., no Surgery needed, [ ]  Yes, Surgery needed,  [ ]  Amputation Bowel ischemia: No., [ ]  Medical Rx, [ ]  Surgical Rx Wound complication: No., [ ]  Superficial separation/infection, [ ]  Return to OR Return to OR: No  Return to OR for bleeding: No Stroke: No., [ ]  Minor, [ ]  Major  Discharge medications: Statin use:  Yes  ASA use:  Yes  Plavix use:  No  Beta blocker use:  No  ARB use:  No ACEI use:  No CCB use:  No

## 2024-05-11 NOTE — Progress Notes (Signed)
 Patient given discharge instructions medication list and follow up appointments, IV and telemetry removed, all questions answered. Will discharge home as ordered. Erickson Yamashiro Jessup RN

## 2024-05-11 NOTE — Discharge Instructions (Signed)

## 2024-05-11 NOTE — Progress Notes (Signed)
  Progress Note    05/11/2024 8:17 AM 1 Day Post-Op  Subjective:  no complaints. Eager to go home   Vitals:   05/10/24 1736 05/11/24 0714  BP:    Pulse: 97   Resp: 20   Temp:  98.3 F (36.8 C)  SpO2: 99%    Physical Exam: Cardiac:  regular Lungs:  non labored Extremities:  left CF access site soft without swelling or hematoma. 2+ femoral pulses, 2+ DP pulses Abdomen:  soft, non tender, non distended Neurologic: alert and oriented   CBC    Component Value Date/Time   WBC 15.8 (H) 05/11/2024 0525   RBC 4.10 (L) 05/11/2024 0525   HGB 12.2 (L) 05/11/2024 0525   HGB 11.0 (L) 01/05/2024 1144   HCT 37.7 (L) 05/11/2024 0525   HCT 39.7 05/03/2017 1745   PLT 199 05/11/2024 0525   PLT 493 (H) 01/05/2024 1144   MCV 92.0 05/11/2024 0525   MCH 29.8 05/11/2024 0525   MCHC 32.4 05/11/2024 0525   RDW 13.7 05/11/2024 0525   LYMPHSABS 2.3 01/05/2024 1144   MONOABS 2.2 (H) 01/05/2024 1144   EOSABS 0.0 01/05/2024 1144   BASOSABS 0.1 01/05/2024 1144    BMET    Component Value Date/Time   NA 138 05/11/2024 0525   K 4.0 05/11/2024 0525   CL 103 05/11/2024 0525   CO2 23 05/11/2024 0525   GLUCOSE 121 (H) 05/11/2024 0525   BUN 13 05/11/2024 0525   CREATININE 1.27 (H) 05/11/2024 0525   CREATININE 1.43 (H) 01/05/2024 1144   CREATININE 1.09 03/24/2023 0000   CALCIUM  9.2 05/11/2024 0525   GFRNONAA >60 05/11/2024 0525   GFRNONAA 55 (L) 01/05/2024 1144   GFRAA >60 03/11/2018 0903    INR    Component Value Date/Time   INR 1.1 05/10/2024 1130     Intake/Output Summary (Last 24 hours) at 05/11/2024 0817 Last data filed at 05/11/2024 0300 Gross per 24 hour  Intake 600 ml  Output 855 ml  Net -255 ml     Assessment/Plan:  63 y.o. male is s/p EVAR with aortic tube graft 1 Day Post-Op   Left common femoral access site soft without swelling or hematoma No back or abdominal pain BLE well perfused and warm with palpable pedal pulses H&H stable. WBC elevated but likely  reactive. Scr around baseline Continue Aspirin  and statin Stable for discharge home today  He will have follow up with Dr. Pearline in 1 month with CTA Abd   Teretha Damme, PA-C [facility] [phone] 05/11/2024 8:17 AM

## 2024-05-12 LAB — POCT ACTIVATED CLOTTING TIME: Activated Clotting Time: 216 s

## 2024-05-16 ENCOUNTER — Other Ambulatory Visit: Payer: Self-pay

## 2024-05-16 DIAGNOSIS — I7143 Infrarenal abdominal aortic aneurysm, without rupture: Secondary | ICD-10-CM

## 2024-05-17 ENCOUNTER — Ambulatory Visit: Payer: MEDICAID | Admitting: Nurse Practitioner

## 2024-06-10 ENCOUNTER — Ambulatory Visit (HOSPITAL_COMMUNITY)
Admission: RE | Admit: 2024-06-10 | Discharge: 2024-06-10 | Disposition: A | Discharge status: Home / Self Care | Payer: MEDICAID | Source: Ambulatory Visit | Attending: Vascular Surgery | Admitting: Vascular Surgery

## 2024-06-10 DIAGNOSIS — I771 Stricture of artery: Secondary | ICD-10-CM | POA: Diagnosis not present

## 2024-06-10 DIAGNOSIS — I7143 Infrarenal abdominal aortic aneurysm, without rupture: Secondary | ICD-10-CM | POA: Insufficient documentation

## 2024-06-10 MED ORDER — IOHEXOL 350 MG/ML SOLN
75.0000 mL | Freq: Once | INTRAVENOUS | Status: AC | PRN
Start: 2024-06-10 — End: 2024-06-10
  Administered 2024-06-10: 75 mL via INTRAVENOUS

## 2024-06-23 NOTE — Progress Notes (Unsigned)
 Patient ID: [name], male   DOB: 27-Nov-1960, 63 y.o.   MRN: [id number]  Reason for Consult: Routine Post Op   Referred by [name], MD  Subjective:     HPI [name] is a 63 y.o. male presenting for follow-up of a PAU/saccular infrarenal aneurysm.  He underwent repair with endovascular tube graft on 05/10/2024.  He reports the groin access site healed well and he denies any abdominal or back pain.  Past Medical History:  Diagnosis Date   AAA (abdominal aortic aneurysm)    Alcoholic (HCC)    Bipolar 1 disorder (HCC)    Depression    Diabetes mellitus without complication (HCC)    Per pt he is pre-diabetic (05-09-24 per patient he is not diabetic)   Homeless    Schizoaffective schizophrenia (HCC)    Family History  Problem Relation Age of Onset   Colon cancer Neg Hx    Rectal cancer Neg Hx    Stomach cancer Neg Hx    Past Surgical History:  Procedure Laterality Date   ABDOMINAL AORTIC ENDOVASCULAR STENT GRAFT N/A 05/10/2024   Procedure: INSERTION, ENDOVASCULAR STENT GRAFT, AORTA, ABDOMINAL;  Surgeon: Pearline Norman RAMAN, MD;  Location: [facility] OR;  Service: Vascular;  Laterality: N/A;   COLONOSCOPY  07/16/2023   DENTAL SURGERY     wisdom teeth   INTERCOSTAL NERVE BLOCK Left 07/23/2023   Procedure: INTERCOSTAL NERVE BLOCK;  Surgeon: Kerrin Elspeth BROCKS, MD;  Location: [facility] OR;  Service: Thoracic;  Laterality: Left;   LYMPH NODE BIOPSY Left 07/23/2023   Procedure: LYMPH NODE BIOPSY;  Surgeon: Kerrin Elspeth BROCKS, MD;  Location: [facility] OR;  Service: Thoracic;  Laterality: Left;   ULTRASOUND GUIDANCE FOR VASCULAR ACCESS  05/10/2024   Procedure: ULTRASOUND GUIDANCE, FOR VASCULAR ACCESS;  Surgeon: Pearline Norman RAMAN, MD;  Location: [facility] OR;  Service: Vascular;;    Short Social History:  Social History   Tobacco Use   Smoking status: Every Day    Current packs/day: 1.00    Average packs/day: 1 pack/day for 47.8 years (47.8 ttl pk-yrs)    Types: Cigarettes     Start date: 1978   Smokeless tobacco: Never   Tobacco comments:    7 packs of cigarettes in a week. 05/13/23 Tay  Substance Use Topics   Alcohol use: Yes    No Known Allergies  Current Outpatient Medications  Medication Sig Dispense Refill   acetaminophen  (TYLENOL ) 500 MG tablet Take 1,000 mg by mouth every 6 (six) hours as needed for moderate pain (pain score 4-6).     albuterol  (VENTOLIN  HFA) 108 (90 Base) MCG/ACT inhaler Inhale 2 puffs into the lungs every 6 (six) hours as needed for wheezing or shortness of breath. 8 g 6   aspirin  EC 81 MG tablet Take 1 tablet (81 mg total) by mouth daily at 6 (six) AM. Swallow whole. 30 tablet 12   budesonide -formoterol  (SYMBICORT ) 80-4.5 MCG/ACT inhaler Inhale 2 puffs into the lungs in the morning and at bedtime. (Patient taking differently: Inhale 2 puffs into the lungs 2 (two) times daily as needed (wheezing, shortness of breath).) 1 each 11   cetirizine  (ZYRTEC ) 10 MG tablet Take 10 mg by mouth every 12 (twelve) hours as needed for allergies.     ibuprofen  (ADVIL ) 200 MG tablet Take 400 mg by mouth 2 (two) times daily as needed for moderate pain (pain score 4-6) or headache.     indomethacin  (INDOCIN ) 50 MG capsule Take 1 capsule (50 mg  total) by mouth 3 (three) times daily with meals. (Patient taking differently: Take 50 mg by mouth 3 (three) times daily as needed (pain, gout).) 50 capsule 2   rosuvastatin  (CRESTOR ) 10 MG tablet Take 1 tablet (10 mg total) by mouth daily. 90 tablet 3   No current facility-administered medications for this visit.    REVIEW OF SYSTEMS  All other systems were reviewed and are negative     Objective:  Objective   Vitals:   06/24/24 1147  Pulse: 88  Resp: 18  Temp: 98 F (36.7 C)  TempSrc: Temporal  SpO2: 100%  Weight: 148 lb (67.1 kg)  Height: 6' (1.829 m)   Body mass index is 20.07 kg/m.  Physical Exam General: no acute distress Cardiac: hemodynamically stable Pulm: normal work of  breathing Abdomen: non-tender, no pulsatile mass Neuro: alert, no focal deficit Extremities: no edema, cyanosis or wounds Vascular:   Right: 2+ palpable radial  Left: 1+ palpable radial  Data: CTA independently reviewed. Good apposition of aortic stent graft without endoleak, sealed PAU/saccular aneurysm     Assessment/Plan:   [name] is a 63 y.o. male with an infrarenal PAU/saccular aneurysm underwent EVAR with aortic tube graft on 05/10/2024.  He has recovered well and the CTA from September 20th demonstrates graft apposition with seal and no endoleak. Plan to continue with annual follow-up with duplex imaging.  Continue aspirin , statin, encouraged smoking cessation Follow-up in 12 months with AAA duplex and a bilateral lower extremity duplex to assess for popliteal aneurysms and to quantify his perfusion as he does not have palpable pedal pulses.   Norman GORMAN Serve MD [facility]

## 2024-06-24 ENCOUNTER — Encounter: Payer: Self-pay | Admitting: Vascular Surgery

## 2024-06-24 ENCOUNTER — Ambulatory Visit: Payer: MEDICAID | Attending: Vascular Surgery | Admitting: Vascular Surgery

## 2024-06-24 VITALS — HR 88 | Temp 98.0°F | Resp 18 | Ht 72.0 in | Wt 148.0 lb

## 2024-06-24 DIAGNOSIS — I719 Aortic aneurysm of unspecified site, without rupture: Secondary | ICD-10-CM | POA: Insufficient documentation

## 2024-06-24 DIAGNOSIS — I671 Cerebral aneurysm, nonruptured: Secondary | ICD-10-CM | POA: Diagnosis not present

## 2024-06-24 MED ORDER — ROSUVASTATIN CALCIUM 10 MG PO TABS: 10.0000 mg | ORAL_TABLET | Freq: Every day | ORAL | 3 refills | Status: AC

## 2024-08-12 ENCOUNTER — Inpatient Hospital Stay: Admission: RE | Admit: 2024-08-12 | Payer: MEDICAID | Source: Ambulatory Visit

## 2024-08-12 ENCOUNTER — Inpatient Hospital Stay: Payer: MEDICAID | Attending: Internal Medicine

## 2024-08-25 ENCOUNTER — Ambulatory Visit: Payer: MEDICAID | Admitting: Internal Medicine

## 2024-08-25 NOTE — Telephone Encounter (Signed)
 NFN
